# Patient Record
Sex: Male | Born: 1935
Health system: Southern US, Community
[De-identification: ages and names within clinical notes are randomized; demographics above are authoritative.]

## PROBLEM LIST (undated history)

## (undated) DIAGNOSIS — Z298 Encounter for other specified prophylactic measures: Secondary | ICD-10-CM

## (undated) DIAGNOSIS — D649 Anemia, unspecified: Secondary | ICD-10-CM

## (undated) DIAGNOSIS — N400 Enlarged prostate without lower urinary tract symptoms: Secondary | ICD-10-CM

## (undated) DIAGNOSIS — I509 Heart failure, unspecified: Secondary | ICD-10-CM

## (undated) DIAGNOSIS — E785 Hyperlipidemia, unspecified: Secondary | ICD-10-CM

## (undated) DIAGNOSIS — R011 Cardiac murmur, unspecified: Secondary | ICD-10-CM

## (undated) DIAGNOSIS — I10 Essential (primary) hypertension: Secondary | ICD-10-CM

## (undated) DIAGNOSIS — N189 Chronic kidney disease, unspecified: Secondary | ICD-10-CM

## (undated) DIAGNOSIS — Z2989 Encounter for other specified prophylactic measures: Secondary | ICD-10-CM

## (undated) DIAGNOSIS — I519 Heart disease, unspecified: Secondary | ICD-10-CM

## (undated) DIAGNOSIS — M1990 Unspecified osteoarthritis, unspecified site: Secondary | ICD-10-CM

## (undated) DIAGNOSIS — D689 Coagulation defect, unspecified: Secondary | ICD-10-CM

## (undated) DIAGNOSIS — I2510 Atherosclerotic heart disease of native coronary artery without angina pectoris: Secondary | ICD-10-CM

## (undated) HISTORY — DX: Hyperlipidemia, unspecified: E78.5

## (undated) HISTORY — DX: Cardiac murmur, unspecified: R01.1

## (undated) HISTORY — DX: Heart failure, unspecified: I50.9

## (undated) HISTORY — DX: Encounter for other specified prophylactic measures: Z29.8

## (undated) HISTORY — PX: HERNIA REPAIR: SHX51

## (undated) HISTORY — PX: EYE SURGERY: SHX253

## (undated) HISTORY — DX: Anemia, unspecified: D64.9

## (undated) HISTORY — DX: Atherosclerotic heart disease of native coronary artery without angina pectoris: I25.10

## (undated) HISTORY — DX: Coagulation defect, unspecified: D68.9

## (undated) HISTORY — DX: Essential (primary) hypertension: I10

## (undated) HISTORY — DX: Benign prostatic hyperplasia without lower urinary tract symptoms: N40.0

## (undated) HISTORY — DX: Chronic kidney disease, unspecified: N18.9

## (undated) HISTORY — DX: Encounter for other specified prophylactic measures: Z29.89

## (undated) HISTORY — DX: Heart disease, unspecified: I51.9

## (undated) HISTORY — DX: Unspecified osteoarthritis, unspecified site: M19.90

---

## 1962-03-11 HISTORY — PX: BACK SURGERY: SHX140

## 1993-03-11 DIAGNOSIS — I2510 Atherosclerotic heart disease of native coronary artery without angina pectoris: Secondary | ICD-10-CM

## 1993-03-11 HISTORY — PX: ANGIOPLASTY: SHX39

## 1993-03-11 HISTORY — DX: Atherosclerotic heart disease of native coronary artery without angina pectoris: I25.10

## 2006-03-06 ENCOUNTER — Encounter: Payer: Self-pay | Admitting: Family Medicine

## 2006-03-06 LAB — CONVERTED CEMR LAB
AST: 17 units/L
Alkaline Phosphatase: 69 units/L
BUN: 17 mg/dL
Creatinine, Ser: 0.9 mg/dL
Glucose, Bld: 111 mg/dL
HCT: 40.7 %
Hemoglobin: 14.1 g/dL
LDL Cholesterol: 89 mg/dL
Microalbumin U total vol: 2.23 mg/L
PSA: 0.57 ng/mL
Potassium: 4.3 meq/L
Total Bilirubin: 0.7 mg/dL
WBC, blood: 4.7 10*3/uL

## 2007-03-13 ENCOUNTER — Ambulatory Visit: Payer: Self-pay | Admitting: Family Medicine

## 2007-03-13 DIAGNOSIS — I2510 Atherosclerotic heart disease of native coronary artery without angina pectoris: Secondary | ICD-10-CM | POA: Insufficient documentation

## 2007-03-13 DIAGNOSIS — N400 Enlarged prostate without lower urinary tract symptoms: Secondary | ICD-10-CM | POA: Insufficient documentation

## 2007-03-13 DIAGNOSIS — I10 Essential (primary) hypertension: Secondary | ICD-10-CM | POA: Insufficient documentation

## 2007-03-13 DIAGNOSIS — L570 Actinic keratosis: Secondary | ICD-10-CM | POA: Insufficient documentation

## 2007-03-13 DIAGNOSIS — Z87898 Personal history of other specified conditions: Secondary | ICD-10-CM | POA: Insufficient documentation

## 2007-03-16 ENCOUNTER — Encounter: Payer: Self-pay | Admitting: Family Medicine

## 2007-04-07 ENCOUNTER — Encounter: Payer: Self-pay | Admitting: Family Medicine

## 2007-04-07 DIAGNOSIS — I2789 Other specified pulmonary heart diseases: Secondary | ICD-10-CM | POA: Insufficient documentation

## 2007-04-10 ENCOUNTER — Ambulatory Visit: Payer: Self-pay | Admitting: Family Medicine

## 2007-04-10 LAB — CONVERTED CEMR LAB

## 2007-04-13 ENCOUNTER — Telehealth (INDEPENDENT_AMBULATORY_CARE_PROVIDER_SITE_OTHER): Payer: Self-pay | Admitting: *Deleted

## 2007-05-27 ENCOUNTER — Encounter: Payer: Self-pay | Admitting: Family Medicine

## 2007-05-27 LAB — CONVERTED CEMR LAB
Anion Gap: 29
BUN: 19 mg/dL
Chloride: 100 meq/L
Glucose, Bld: 109 mg/dL
Potassium: 3.6 meq/L
Sodium: 139 meq/L

## 2007-06-09 ENCOUNTER — Ambulatory Visit: Payer: Self-pay | Admitting: Family Medicine

## 2007-07-10 ENCOUNTER — Encounter: Payer: Self-pay | Admitting: Family Medicine

## 2007-07-13 ENCOUNTER — Encounter: Payer: Self-pay | Admitting: Family Medicine

## 2007-08-07 ENCOUNTER — Ambulatory Visit: Payer: Self-pay | Admitting: Family Medicine

## 2007-08-07 DIAGNOSIS — R809 Proteinuria, unspecified: Secondary | ICD-10-CM | POA: Insufficient documentation

## 2007-08-10 LAB — CONVERTED CEMR LAB
Cholesterol: 171 mg/dL (ref 0–200)
HDL: 72 mg/dL (ref >39)
LDL Cholesterol: 87 mg/dL (ref 0–99)
Total CHOL/HDL Ratio: 2.4
Triglycerides: 59 mg/dL (ref <150)
VLDL: 12 mg/dL (ref 0–40)

## 2007-09-07 ENCOUNTER — Encounter: Payer: Self-pay | Admitting: Family Medicine

## 2007-09-09 ENCOUNTER — Encounter: Payer: Self-pay | Admitting: Family Medicine

## 2007-09-14 ENCOUNTER — Encounter: Payer: Self-pay | Admitting: Family Medicine

## 2008-05-06 ENCOUNTER — Ambulatory Visit: Payer: Self-pay | Admitting: Family Medicine

## 2008-05-06 LAB — CONVERTED CEMR LAB

## 2008-05-09 LAB — CONVERTED CEMR LAB: PSA: 0.76 ng/mL (ref 0.10–4.00)

## 2008-09-16 ENCOUNTER — Encounter: Payer: Self-pay | Admitting: Family Medicine

## 2008-11-28 ENCOUNTER — Ambulatory Visit: Payer: Self-pay | Admitting: Family Medicine

## 2008-11-29 LAB — CONVERTED CEMR LAB
AST: 19 units/L (ref 0–37)
Albumin: 4.2 g/dL (ref 3.5–5.2)
Alkaline Phosphatase: 64 units/L (ref 39–117)
Creatinine, Urine: 34.6 mg/dL
Direct LDL: 83 mg/dL
Glucose, Bld: 109 mg/dL — ABNORMAL HIGH (ref 70–99)
Microalb, Ur: 0.5 mg/dL (ref 0.00–1.89)
Potassium: 4.7 meq/L (ref 3.5–5.3)
Sodium: 143 meq/L (ref 135–145)
Total Bilirubin: 0.4 mg/dL (ref 0.3–1.2)
Total Protein: 6.7 g/dL (ref 6.0–8.3)

## 2009-01-09 ENCOUNTER — Ambulatory Visit: Payer: Self-pay | Admitting: Family Medicine

## 2009-05-10 ENCOUNTER — Ambulatory Visit: Payer: Self-pay | Admitting: Family Medicine

## 2009-05-10 LAB — CONVERTED CEMR LAB: Creatinine,U: 200 mg/dL

## 2009-05-11 LAB — CONVERTED CEMR LAB
Cholesterol: 165 mg/dL (ref 0–200)
HDL: 62 mg/dL (ref >39)
PSA: 0.84 ng/mL (ref 0.10–4.00)

## 2009-09-12 ENCOUNTER — Ambulatory Visit: Payer: Self-pay | Admitting: Family Medicine

## 2009-09-21 ENCOUNTER — Encounter: Payer: Self-pay | Admitting: Family Medicine

## 2009-10-03 ENCOUNTER — Ambulatory Visit: Payer: Self-pay | Admitting: Family Medicine

## 2009-10-04 ENCOUNTER — Encounter: Payer: Self-pay | Admitting: Family Medicine

## 2009-10-24 ENCOUNTER — Encounter: Payer: Self-pay | Admitting: Family Medicine

## 2009-10-27 HISTORY — PX: CORONARY ARTERY BYPASS GRAFT: SHX141

## 2009-10-31 ENCOUNTER — Encounter: Payer: Self-pay | Admitting: Family Medicine

## 2009-11-07 ENCOUNTER — Encounter: Payer: Self-pay | Admitting: Family Medicine

## 2009-11-21 ENCOUNTER — Encounter: Payer: Self-pay | Admitting: Family Medicine

## 2009-11-24 ENCOUNTER — Encounter: Payer: Self-pay | Admitting: Family Medicine

## 2009-12-05 ENCOUNTER — Encounter: Payer: Self-pay | Admitting: Family Medicine

## 2009-12-18 ENCOUNTER — Encounter: Payer: Self-pay | Admitting: Family Medicine

## 2009-12-20 ENCOUNTER — Ambulatory Visit: Payer: Self-pay | Admitting: Family Medicine

## 2010-01-29 ENCOUNTER — Encounter: Payer: Self-pay | Admitting: Family Medicine

## 2010-01-29 DIAGNOSIS — E785 Hyperlipidemia, unspecified: Secondary | ICD-10-CM | POA: Insufficient documentation

## 2010-01-29 DIAGNOSIS — E1122 Type 2 diabetes mellitus with diabetic chronic kidney disease: Secondary | ICD-10-CM | POA: Insufficient documentation

## 2010-01-29 DIAGNOSIS — E119 Type 2 diabetes mellitus without complications: Secondary | ICD-10-CM | POA: Insufficient documentation

## 2010-02-16 ENCOUNTER — Encounter: Payer: Self-pay | Admitting: Family Medicine

## 2010-02-16 LAB — CONVERTED CEMR LAB
HDL: 72 mg/dL
Triglycerides: 59 mg/dL

## 2010-03-26 ENCOUNTER — Encounter: Payer: Self-pay | Admitting: Family Medicine

## 2010-04-04 ENCOUNTER — Telehealth: Payer: Self-pay | Admitting: Family Medicine

## 2010-04-06 ENCOUNTER — Encounter: Payer: Self-pay | Admitting: Family Medicine

## 2010-04-06 ENCOUNTER — Ambulatory Visit
Admission: RE | Admit: 2010-04-06 | Discharge: 2010-04-06 | Payer: Self-pay | Source: Home / Self Care | Attending: Family Medicine | Admitting: Family Medicine

## 2010-04-10 NOTE — Assessment & Plan Note (Signed)
Summary: MEDICARE PHYSICAL   Vital Signs:  Patient profile:   77 year old male Height:      68 inches Weight:      151 pounds BMI:     23.04 Temp:     98.1 degrees F oral Pulse rate:   80 / minute Pulse rhythm:   regular BP sitting:   111 / 66  (right arm) Cuff size:   regular  Vitals Entered By: Duard Brady LPN (September 12, 3498 8:18 AM) CC: cpx- doing well -     fbs 117 Is Patient Diabetic? Yes Did you bring your meter with you today? No   Primary Care Provider:  Seymour Bars DO  CC:  cpx- doing well -     fbs 117.  History of Present Illness: 77 yo WM presents for Medicare Continuing Physical.  He has a PMH of HTN, CAD, T2DM, BPH and pulm HTN.  Doing well on Terazosin, Simvastatin, Nifediac, Fosinopril, Metformin and ASA.  Gets drugs from the [place].  He has no allergies, never used tobacco, denies ETOH or ellicit drugs.  He lives alone with family nearby.  he does all of his ADLs other than his finances, which his daughter takes care of.  he widowed to [place] last Sept and seems to be getting along OK.    Over the past 2 wks, he has not felt down or depressed but admits to missing his wife often.  Over the past 2 wks, he has not had little pleasure in doeing things.  [place].  His functional ability screen is Neg x 3 other than finances.   Hearing eval is grossly normal. Sees his eye doctor on Monday for annual eye exam.  He sees Dr Mindi Curling (cards), last seen 6-09 for a stress echo. Has a living will but plans to go over advanced directives.    DRE today. Colon 05--> 2015 Td 2004 PNX at 67 DEXA-- Chol 3-11 Cr 11-2008 no AAA screen (never smoked).   Allergies: No Known Drug Allergies  Past History:  Past Medical History: Reviewed history from 05/06/2008 and no changes required. DM, type II since 2004 CAD with angioplasty 1995 HTN high chol anemia BPH mild valvular heart dz with pulm HTN needs SBE prophylaxis  Past Surgical  History: Reviewed history from 03/13/2007 and no changes required. angioplasty 1995, [facility] Hernia x 2 back surgery 1964  Social History: Reviewed history from 11/28/2008 and no changes required. Retired from Research officer, trade union. HS diploma. Widowed 11-2008 2 grown children, local with 3 grandkids. Never smoked. Denies ETOH. Does some yardwork. Walks 15 min daily.  Review of Systems  The patient denies anorexia, fever, weight loss, weight gain, vision loss, decreased hearing, hoarseness, chest pain, syncope, dyspnea on exertion, peripheral edema, prolonged cough, headaches, hemoptysis, abdominal pain, melena, hematochezia, severe indigestion/heartburn, hematuria, incontinence, genital sores, muscle weakness, suspicious skin lesions, transient blindness, difficulty walking, depression, unusual weight change, abnormal bleeding, enlarged lymph nodes, angioedema, breast masses, and testicular masses.    Physical Exam  General:  alert, well-developed, well-nourished, and well-hydrated.   Head:  normocephalic and atraumatic.   Eyes:  pupils equal, pupils round, and pupils reactive to light.   Ears:  EACs patent; TMs translucent and gray with good cone of light and bony landmarks.  Nose:  no nasal discharge.   Mouth:  pharynx pink and moist and poor dentition.   Neck:  no masses.  no audible carotid bruits, no JVD  Lungs:  Normal respiratory effort, chest expands symmetrically. Lungs are clear to auscultation, no crackles or wheezes. Heart:  2/6 systolic murmur over apex, no AA bruitsnormal rate and regular rhythm.   Abdomen:  Bowel sounds positive,abdomen soft and non-tender without masses, organomegaly Rectal:  No external abnormalities noted. Normal sphincter tone. No rectal masses or tenderness.  hemoccult neg Prostate:  Prostate gland firm and smooth, 1+ enlargement, no nodularity, tenderness, mass, asymmetry or induration. Msk:  no joint tenderness, no joint swelling, no joint  warmth, and no redness over joints.   Pulses:  2+ radial and pedal pulses Extremities:  no LE edema Neurologic:  gait normal.   Skin:  color normal.   Psych:  memory intact for recent and remote, good eye contact, not anxious appearing, and not depressed appearing.     Impression & Recommendations:  Problem # 1:  HEALTH MAINTENANCE EXAM (ICD-V70.0) Full preventive medicare physical done.  he will return for the EKG portion (nursing shortage today). TD done 04 DRE done. Colonoscopy due 2015. PNX UTD. Labs UTD.  Orders: Subsequent annual wellness visit with prevention plan (Y7829)  Complete Medication List: 1)  Terazosin Hcl 10 Mg Caps (Terazosin hcl) .... Take one tablet by mouth once a day 2)  Simvastatin 20 Mg Tabs (Simvastatin) .... Take 1/2 tablet by mouth at bedtime 3)  Nifediac Cc 30 Mg Tb24 (Nifedipine) .... Take one tablet by mouth once a day 4)  Fosinopril Sodium 20 Mg Tabs (Fosinopril sodium) .Marland Kitchen.. 1 tab by mouth daily 5)  Metformin Hcl 500 Mg Tabs (Metformin hcl) .... Take one tablet by mouth twice a day 6)  Bufferin Low Dose 81 Mg Tbec (Aspirin) .... Take one tablet by mouth once a day  Patient Instructions: 1)  Return for a NURSE VISIT IN 3 WKS FOR EKG AND A1C (NO CHARGE FOR EKG)

## 2010-04-10 NOTE — Miscellaneous (Signed)
Summary: CABG  Clinical Lists Changes  Observations: Added new observation of PAST SURG HX: angioplasty 1995, [facility] Hernia x 2 back surgery 1964 5 vessel CABG 10-27-2009, Dr Raoul Pitch (11/24/2009 14:07) Added new observation of PRIMARY MD: Seymour Bars DO (11/24/2009 14:07)        Past History:  Past Surgical History: angioplasty 1995, [facility] Hernia x 2 back surgery 1964 5 vessel CABG 10-27-2009, Dr Raoul Pitch

## 2010-04-10 NOTE — Letter (Signed)
[facility]: [facility] Kathryne Sharper   Imported By: Lanelle Bal 12/14/2009 10:28:33  _____________________________________________________________________  External Attachment:    Type:   Image     Comment:   External Document

## 2010-04-10 NOTE — Cardiovascular Report (Signed)
Summary: [facility]  [facility]   Imported By: Lanelle Bal 10/31/2009 12:38:32  _____________________________________________________________________  External Attachment:    Type:   Image     Comment:   External Document

## 2010-04-10 NOTE — Letter (Signed)
Summary: [facility]  [facility]   Imported By: Lanelle Bal 11/06/2009 10:17:21  _____________________________________________________________________  External Attachment:    Type:   Image     Comment:   External Document

## 2010-04-10 NOTE — Letter (Signed)
Summary: [facility]  [facility]   Imported By: Lanelle Bal 12/05/2009 11:27:12  _____________________________________________________________________  External Attachment:    Type:   Image     Comment:   External Document

## 2010-04-10 NOTE — Assessment & Plan Note (Signed)
Summary: FLU-SHOT-VEW  Nurse Visit   Vitals Entered By: Payton Spark CMA (December 20, 2009 9:58 AM)  Allergies: No Known Drug Allergies  Orders Added: 1)  Flu Vaccine 52yrs + MEDICARE PATIENTS [Q2039] 2)  Administration Flu vaccine - MCR [G0008] Flu Vaccine Consent Questions     Do you have a history of severe allergic reactions to this vaccine? no    Any prior history of allergic reactions to egg and/or gelatin? no    Do you have a sensitivity to the preservative Thimersol? no    Do you have a past history of Guillan-Barre Syndrome? no    Do you currently have an acute febrile illness? no    Have you ever had a severe reaction to latex? no    Vaccine information given and explained to patient? yes    Are you currently pregnant? no    Lot Number:[id number]   Exp Date:09/08/2010   Site Given  Left Deltoid IMmedflu

## 2010-04-10 NOTE — Letter (Signed)
Summary: Obstructive Sleep Apnea Screening Questionnaire/[facility]   Obstructive Sleep Apnea Screening Questionnaire/[facility]   Imported By: Maryln Gottron 02/16/2010 13:39:15  _____________________________________________________________________  External Attachment:    Type:   Image     Comment:   External Document

## 2010-04-10 NOTE — Medication Information (Signed)
Summary: Diabetes Supplies/[facility]  Diabetes Supplies/[facility]   Imported By: Lanelle Bal 11/15/2009 08:28:42  _____________________________________________________________________  External Attachment:    Type:   Image     Comment:   External Document

## 2010-04-10 NOTE — Letter (Signed)
[facility]: [facility]  [facility]   Imported By: Lanelle Bal 10/13/2009 12:16:28  _____________________________________________________________________  External Attachment:    Type:   Image     Comment:   External Document

## 2010-04-10 NOTE — Assessment & Plan Note (Signed)
Summary: EKG AND A1C- JR  Nurse Visit   Vitals Entered By: Payton Spark CMA (October 03, 2009 8:44 AM) CC: EKG and A1C   Allergies: No Known Drug Allergies Laboratory Results   Blood Tests     HGBA1C: 5.9%   (Normal Range: Non-Diabetic - 3-6%   Control Diabetic - 6-8%)     Orders Added: 1)  Fingerstick [36416] 2)  Hgb A1C [83036QW]     Appended Document: EKG AND A1C- JR Pls let pt know that his A1C is perfect at 5.9 indicating great control of his diabetes.  EKG is abnormal.  FAx copy to [facility] cardiology -- needs f/u appt ASAP.  Seymour Bars, D.O.  Appended Document: EKG AND A1C- JR Pt aware of the above. Scheduled w/ Dr. Molly Maduro tomorrow @ 10am. Pt aware.

## 2010-04-10 NOTE — Miscellaneous (Signed)
Summary: normal eye exam  Clinical Lists Changes  Observations: Added new observation of DIAB EYE EX: normal (Dr Luretha Murphy) (09/18/2009 12:12)

## 2010-04-10 NOTE — Assessment & Plan Note (Signed)
Summary: f/u diabetes   Vital Signs:  Patient profile:   77 year old male Height:      68 inches Weight:      145 pounds BMI:     22.13 O2 Sat:      97 % on Room air Pulse rate:   98 / minute BP sitting:   116 / 63  (left arm) Cuff size:   regular  Vitals Entered By: Payton Spark CMA (May 10, 2009 8:39 AM)  O2 Flow:  Room air CC: F/U DM   Primary Care Provider:  Seymour Bars DO  CC:  F/U DM.  History of Present Illness: 77 yo WM presents for f/u T2DM.  Doing well on Metformin 500 mg two times a day.  Checking fasting sugars once a wk, in the 110s.  Denies any lows.  Adherent to diabetic diet.  Staying physically active.  Denies blurry vision or paresthesias.  His FLP is due and his PSA is due.  He goes to the [place] but we do not get his labs sent here.    Denies CP or DOE.     Allergies (verified): No Known Drug Allergies  Past History:  Past Medical History: Reviewed history from 05/06/2008 and no changes required. DM, type II since 2004 CAD with angioplasty 1995 HTN high chol anemia BPH mild valvular heart dz with pulm HTN needs SBE prophylaxis  Past Surgical History: Reviewed history from 03/13/2007 and no changes required. angioplasty 1995, [facility] Hernia x 2 back surgery 1964  Social History: Reviewed history from 11/28/2008 and no changes required. Retired from Research officer, trade union. HS diploma. Widowed 11-2008 2 grown children, local with 3 grandkids. Never smoked. Denies ETOH. Does some yardwork. Walks 15 min daily.  Review of Systems      See HPI  Physical Exam  General:  alert, well-developed, well-nourished, and well-hydrated.   Head:  normocephalic and atraumatic.   Eyes:  wears glasses, PERRLA Ears:  no external deformities.   Nose:  no nasal discharge.   Mouth:  pharynx pink and moist and poor dentition.   Neck:  no masses.   Lungs:  Normal respiratory effort, chest expands symmetrically. Lungs are clear to auscultation, no  crackles or wheezes. Heart:  2/6 systolic murmur over apex, no AA bruitsnormal rate and regular rhythm.   Pulses:  2+ radial pulses Extremities:  no LE edema Skin:  color normal.   Cervical Nodes:  No lymphadenopathy noted Psych:  good eye contact, not anxious appearing, and not depressed appearing.     Impression & Recommendations:  Problem # 1:  DM (ICD-250.00) Doing well on current meds.  A1C is 6.2, at goal.  Continue meds along with diabetic diet and physical activity.  Eye exam is UTD. Monofilament is UTD.   His updated medication list for this problem includes:    Fosinopril Sodium 20 Mg Tabs (Fosinopril sodium) .Marland Kitchen... 1 tab by mouth daily    Metformin Hcl 500 Mg Tabs (Metformin hcl) .Marland Kitchen... Take one tablet by mouth twice a day    Bufferin Low Dose 81 Mg Tbec (Aspirin) .Marland Kitchen... Take one tablet by mouth once a day  Labs Reviewed: Creat: 1.03 (11/28/2008)   Microalbumin: 150 (05/10/2009)  Last Eye Exam: no retinopathy (Dr Luretha Murphy) (09/05/2008) Reviewed HgBA1c results: 5.5 (11/28/2008)  5.7 (05/06/2008)  Problem # 2:  ESSENTIAL HYPERTENSION, BENIGN (ICD-401.1) Doing well on current meds.  Continue. His updated medication list for this problem includes:    Terazosin  Hcl 10 Mg Caps (Terazosin hcl) .Marland Kitchen... Take one tablet by mouth once a day    Nifediac Cc 30 Mg Tb24 (Nifedipine) .Marland Kitchen... Take one tablet by mouth once a day    Fosinopril Sodium 20 Mg Tabs (Fosinopril sodium) .Marland Kitchen... 1 tab by mouth daily  BP today: 116/63 Prior BP: 125/68 (01/09/2009)  Labs Reviewed: K+: 4.7 (11/28/2008) Creat: : 1.03 (11/28/2008)   Chol: 171 (08/07/2007)   HDL: 72 (08/07/2007)   LDL: 87 (08/07/2007)   TG: 59 (08/07/2007)  Problem # 3:  CAD (ICD-414.00) Stable and doing well on current meds.  Had a stress test about 2 yrs ago. His updated medication list for this problem includes:    Terazosin Hcl 10 Mg Caps (Terazosin hcl) .Marland Kitchen... Take one tablet by mouth once a day    Nifediac Cc 30 Mg Tb24  (Nifedipine) .Marland Kitchen... Take one tablet by mouth once a day    Fosinopril Sodium 20 Mg Tabs (Fosinopril sodium) .Marland Kitchen... 1 tab by mouth daily    Bufferin Low Dose 81 Mg Tbec (Aspirin) .Marland Kitchen... Take one tablet by mouth once a day  Orders: T-Lipid Profile ([id number])  Problem # 4:  PULMONARY HYPERTENSION (ICD-416.8) Will update his EKG at CPE in 4 mos.  Complete Medication List: 1)  Terazosin Hcl 10 Mg Caps (Terazosin hcl) .... Take one tablet by mouth once a day 2)  Simvastatin 20 Mg Tabs (Simvastatin) .... Take 1/2 tablet by mouth at bedtime 3)  Nifediac Cc 30 Mg Tb24 (Nifedipine) .... Take one tablet by mouth once a day 4)  Fosinopril Sodium 20 Mg Tabs (Fosinopril sodium) .Marland Kitchen.. 1 tab by mouth daily 5)  Metformin Hcl 500 Mg Tabs (Metformin hcl) .... Take one tablet by mouth twice a day 6)  Bufferin Low Dose 81 Mg Tbec (Aspirin) .... Take one tablet by mouth once a day  Other Orders: Fingerstick (36416) Hemoglobin A1C (83036) T-PSA Total (Medicare Screen Only) ([id number])  Patient Instructions: 1)  A1C good at 6.2. 2)  Stay on current meds. 3)  Labs today. 4)  Will call you w/ results tomorrow. 5)  Return in 4 mos for a complete physical with EKG.  Laboratory Results   Urine Tests    Microalbumin (urine): 150 mg/L Creatinine: 200mg /dL  A:C Ratio 30-865

## 2010-04-10 NOTE — Letter (Signed)
Summary: [facility]  [facility]   Imported By: Maryln Gottron 02/12/2010 13:24:08  _____________________________________________________________________  External Attachment:    Type:   Image     Comment:   External Document

## 2010-04-12 NOTE — Progress Notes (Signed)
Summary: Cough, cold symptoms  Phone Note Call from Patient Call back at Home Phone [phone]   Caller: Patient Call For: Seymour Bars DO Reason for Call: Acute Illness Summary of Call: pt has a cough and cold. I spoke to pt and he states he is using saline nasal spray and that is helping.  He also is having a productive cough.  Pt  states cough is making his chest hurt more than expected due to his open heart surgery in August.  Pt will call on 1/26 for appt to eval cough.  Pt also would like to review meds at that time as he says his other doctor has changed some meds. Initial call taken by: Francee Piccolo CMA Duncan Dull),  April 04, 2010 5:43 PM

## 2010-04-12 NOTE — Miscellaneous (Signed)
[facility]  Clinical Lists Changes  Observations: Added new observation of HGBA1C: 6.3 % (02/16/2010 16:45) Added new observation of LDL: 57 mg/dL (60/45/4098 11:91) Added new observation of HDL: 72 mg/dL (47/82/9562 13:08) Added new observation of TRIGLYC TOT: 59 mg/dL (65/78/4696 29:52) Added new observation of CHOLESTEROL: 141 mg/dL (84/13/2440 10:27)

## 2010-04-12 NOTE — Assessment & Plan Note (Signed)
Summary: URI   Vital Signs:  Patient profile:   77 year old male Height:      68 inches Weight:      147 pounds BMI:     22.43 O2 Sat:      96 % on Room air Temp:     98.0 degrees F oral Pulse rate:   76 / minute BP sitting:   119 / 66  (left arm) Cuff size:   regular  Vitals Entered By: Payton Spark CMA (April 06, 2010 2:26 PM)  O2 Flow:  Room air CC: Head congestion and cough x 1 week.    Primary Care Provider:  Seymour Bars DO  CC:  Head congestion and cough x 1 week. Marland Kitchen  History of Present Illness: 77 yo WM presents for a cold that started a wk ago with congestion.  No fevers.  Cough is dry.  He is not taking anything other than a sinus rinse since he has HTN and had a CABG in the Fall 2011.  Denies CP or DOE.  Denies sore throat, chest tightness or GI upset.  Current Medications (verified): 1)  Terazosin Hcl 10 Mg  Caps (Terazosin Hcl) .... Take One Tablet By Mouth Once A Day 2)  Metformin Hcl 500 Mg  Tabs (Metformin Hcl) .... Take One Tablet By Mouth Twice A Day 3)  Aspirin 325 Mg Tabs (Aspirin) .... Take 1 Tab By Mouth Once Daily 4)  Pravastatin Sodium 40 Mg Tabs (Pravastatin Sodium) .... Take 1 Tab By Mouth At Bedtime 5)  Metoprolol Succinate 25 Mg Xr24h-Tab (Metoprolol Succinate) .... Take 1 Tab By Mouth Once Daily 6)  Benazepril Hcl 40 Mg Tabs (Benazepril Hcl) .... Take 1 Tab By Mouth Once Daily 7)  Hydrochlorothiazide 25 Mg Tabs (Hydrochlorothiazide) .... Take 2 Tabs By Mouth Daily  Allergies (verified): No Known Drug Allergies  Past History:  Past Medical History: Reviewed history from 05/06/2008 and no changes required. DM, type II since 2004 CAD with angioplasty 1995 HTN high chol anemia BPH mild valvular heart dz with pulm HTN needs SBE prophylaxis  Past Surgical History: Reviewed history from 11/24/2009 and no changes required. angioplasty 1995, [facility] Hernia x 2 back surgery 1964 5 vessel CABG 10-27-2009, Dr Raoul Pitch  Social  History: Reviewed history from 11/28/2008 and no changes required. Retired from Research officer, trade union. HS diploma. Widowed 11-2008 2 grown children, local with 3 grandkids. Never smoked. Denies ETOH. Does some yardwork. Walks 15 min daily.  Review of Systems      See HPI  Physical Exam  General:  alert, well-developed, well-nourished, and well-hydrated.   Eyes:  conjunctiva clear Ears:  EACs patent; TMs translucent and gray with good cone of light and bony landmarks.  Nose:  clear rhinorrhea Mouth:  pharynx pink and moist.   Neck:  no masses.   Lungs:  Normal respiratory effort, chest expands symmetrically. Lungs are clear to auscultation, no crackles or wheezes.  dry cough Heart:  2/6 systolic murmur over apex, no AA bruitsnormal rate and regular rhythm.   Pulses:  2+ radial pulses Extremities:  no LE edema Skin:  color normal.   Cervical Nodes:  shoddy anterior cervical chain LA Psych:  good eye contact, not anxious appearing, and not depressed appearing.     Impression & Recommendations:  Problem # 1:  VIRAL URI (ICD-465.9) Will treat with supportive care measures - Sinus rinse + Tessalon Perles.  Call if any new onset fever, SOB, chest pain or not  improved in 10 days. His updated medication list for this problem includes:    Aspirin 325 Mg Tabs (Aspirin) .Marland Kitchen... Take 1 tab by mouth once daily    Tessalon Perles 100 Mg Caps (Benzonatate) .Marland Kitchen... 1-2 capsules by mouth three times a day as needed cough  Problem # 2:  ESSENTIAL HYPERTENSION, BENIGN (ICD-401.1) BP looks good.  I RFd his HCTZ today.  Had FLP at the [facility] 02-2010.  Update CMP with PSA at CPE in 2 mos. His updated medication list for this problem includes:    Terazosin Hcl 10 Mg Caps (Terazosin hcl) .Marland Kitchen... Take one tablet by mouth once a day    Metoprolol Succinate 25 Mg Xr24h-tab (Metoprolol succinate) .Marland Kitchen... Take 1 tab by mouth once daily    Benazepril Hcl 40 Mg Tabs (Benazepril hcl) .Marland Kitchen... Take 1 tab by mouth once  daily    Hydrochlorothiazide 25 Mg Tabs (Hydrochlorothiazide) .Marland Kitchen... 1 tab by mouth daily  BP today: 119/66 Prior BP: 111/66 (09/12/2009)  Labs Reviewed: K+: 4.7 (11/28/2008) Creat: : 1.03 (11/28/2008)   Chol: 165 (05/10/2009)   HDL: 62 (05/10/2009)   LDL: 84 (05/10/2009)   TG: 96 (05/10/2009)  Complete Medication List: 1)  Terazosin Hcl 10 Mg Caps (Terazosin hcl) .... Take one tablet by mouth once a day 2)  Metformin Hcl 500 Mg Tabs (Metformin hcl) .... Take one tablet by mouth twice a day 3)  Aspirin 325 Mg Tabs (Aspirin) .... Take 1 tab by mouth once daily 4)  Pravastatin Sodium 40 Mg Tabs (Pravastatin sodium) .... Take 1 tab by mouth at bedtime 5)  Metoprolol Succinate 25 Mg Xr24h-tab (Metoprolol succinate) .... Take 1 tab by mouth once daily 6)  Benazepril Hcl 40 Mg Tabs (Benazepril hcl) .... Take 1 tab by mouth once daily 7)  Hydrochlorothiazide 25 Mg Tabs (Hydrochlorothiazide) .Marland Kitchen.. 1 tab by mouth daily 8)  Tessalon Perles 100 Mg Caps (Benzonatate) .Marland Kitchen.. 1-2 capsules by mouth three times a day as needed cough  Patient Instructions: 1)  For cold symptoms: 2)  Continue sinus rinse. 3)  Add Tessalon Perles as needed for cough. 4)  Call if not feeling better in 7-10 days. 5)  I filled HCTZ at [facility] to pick up. 6)  Return for a PHYSICAL WITH PSA  in 2 mos. Prescriptions: TESSALON PERLES 100 MG CAPS (BENZONATATE) 1-2 capsules by mouth three times a day as needed cough  #30 x 0   Entered and Authorized by:   Seymour Bars DO   Signed by:   Seymour Bars DO on 04/06/2010   Method used:   Electronically to        [facility] [phone]* (retail)       [address] [place], [place]  [address]       Ph: [phone]       Fax: [phone]   RxID:   [id number] HYDROCHLOROTHIAZIDE 25 MG TABS (HYDROCHLOROTHIAZIDE) 1 tab by mouth daily  #90 x 1   Entered and Authorized by:   Seymour Bars DO   Signed by:   Seymour Bars DO on 04/06/2010   Method used:   Electronically to         [facility] [phone]* (retail)       [address] [place], [place]  [address]       Ph: [phone]       Fax: [phone]   RxID:   [id number]  Orders Added: 1)  Est. Patient Level III [81859]

## 2010-04-30 ENCOUNTER — Encounter: Payer: Self-pay | Admitting: Family Medicine

## 2010-05-02 NOTE — Letter (Signed)
Summary: [facility]   [facility]   Imported By: Kassie Mends 04/26/2010 08:18:40  _____________________________________________________________________  External Attachment:    Type:   Image     Comment:   External Document

## 2010-05-12 ENCOUNTER — Encounter: Payer: Self-pay | Admitting: Family Medicine

## 2010-06-04 ENCOUNTER — Encounter: Payer: Self-pay | Admitting: Family Medicine

## 2010-06-11 ENCOUNTER — Ambulatory Visit (INDEPENDENT_AMBULATORY_CARE_PROVIDER_SITE_OTHER): Payer: Medicare Other | Admitting: Family Medicine

## 2010-06-11 ENCOUNTER — Encounter: Payer: Self-pay | Admitting: Family Medicine

## 2010-06-11 DIAGNOSIS — Z125 Encounter for screening for malignant neoplasm of prostate: Secondary | ICD-10-CM

## 2010-06-11 DIAGNOSIS — I2510 Atherosclerotic heart disease of native coronary artery without angina pectoris: Secondary | ICD-10-CM

## 2010-06-11 DIAGNOSIS — E119 Type 2 diabetes mellitus without complications: Secondary | ICD-10-CM

## 2010-06-11 DIAGNOSIS — Z0000 Encounter for general adult medical examination without abnormal findings: Secondary | ICD-10-CM

## 2010-06-11 LAB — CBC WITH DIFFERENTIAL/PLATELET
Hemoglobin: 12.8 g/dL — ABNORMAL LOW (ref 13.0–17.0)
Lymphs Abs: 1.5 10*3/uL (ref 0.7–4.0)
MCH: 33.2 pg (ref 26.0–34.0)
MCHC: 34.1 g/dL (ref 30.0–36.0)
MCV: 97.2 fL (ref 78.0–100.0)
Monocytes Relative: 6 % (ref 3–12)
Platelets: 144 10*3/uL — ABNORMAL LOW (ref 150–400)
RBC: 3.85 MIL/uL — ABNORMAL LOW (ref 4.22–5.81)

## 2010-06-11 NOTE — Progress Notes (Signed)
Subjective:    Patient ID: Ryan Bernard, male    DOB: 1931-10-19, 77 y.o.   MRN: [id number]  HPI 77  yo WM presents for CPE.  He has hx of HTN, dyslipidemia, T2DM, CAD, s/p CABG in 2011 and does not have any complaints.  He denies a fam hx of premature heart disease, prostate cancer or colon cancer.    Labs due today, except FLP, done 02-2010. His last tetanus vaccine was xx He had CABG for CAD in 2011 and has had f/u with Dr Mindi Curling.  Did have episode of pAT which has resolved and remains asymptomatic.  Completed cardiac rehab Jan 2012. His last colonoscopy was 2005  He takes a MVI and ASA 81 mg/ day.  BP 143/83  Pulse 68  Ht 5\' 8"  (1.727 m)  Wt 147 lb (66.679 kg)  BMI 22.35 kg/m2  SpO2 96%  Past Medical History  Diagnosis Date  . CAD (coronary artery disease) 1995    w/ angioplasty  . Hypertension   . Hyperlipidemia   . Anemia   . BPH (benign prostatic hyperplasia)   . Heart disease     Mild valvular w/ pulm HTN  . SBE (subacute bacterial endocarditis) prophylaxis candidate   . Diabetes mellitus 2004    type 2    Past Surgical History  Procedure Date  . Angioplasty 1995    [facility]  . Hernia repair     x 2   . Back surgery 1964  . Coronary artery bypass graft 10/27/2009    5 vessel, Dr. Raoul Pitch    Family History  Problem Relation Age of Onset  . Heart disease Mother   . Hypertension Mother   . Alcohol abuse Father   . Depression Daughter   . Depression Son     History   Social History  . Marital Status: Married    Spouse Name: N/A    Number of Children: N/A  . Years of Education: N/A   Occupational History  . Not on file.   Social History Main Topics  . Smoking status: Never Smoker   . Smokeless tobacco: Not on file  . Alcohol Use: No  . Drug Use: No  . Sexually Active:    Other Topics Concern  . Not on file   Social History Narrative  . No narrative on file    Not on File  Current outpatient prescriptions:aspirin 325 MG  tablet, Take 325 mg by mouth daily.  , Disp: , Rfl: ;  benazepril (LOTENSIN) 40 MG tablet, Take 40 mg by mouth daily.  , Disp: , Rfl: ;  hydrochlorothiazide 25 MG tablet, Take 25 mg by mouth daily.  , Disp: , Rfl: ;  metFORMIN (GLUCOPHAGE) 500 MG tablet, Take 500 mg by mouth 2 (two) times daily with a meal.  , Disp: , Rfl:  metoprolol succinate (TOPROL-XL) 25 MG 24 hr tablet, Take 25 mg by mouth daily.  , Disp: , Rfl: ;  pravastatin (PRAVACHOL) 40 MG tablet, Take 40 mg by mouth at bedtime.  , Disp: , Rfl: ;  terazosin (HYTRIN) 10 MG capsule, Take 10 mg by mouth daily.  , Disp: , Rfl: ;  benzonatate (TESSALON) 100 MG capsule, Take by mouth as directed. Take 1-2 capsules po tid prn cough , Disp: , Rfl:      Review of Systems Gen: no fevers, chills, hot flashes, night sweats, change in weight GI: no N/V/C/D GU: no dysuria, incontinence or sexual dysfunction CV: no  chest pain, DOE, palpitations s or edema Pulm:  Denies CP, SOB or chronic cough      Objective:   Physical Exam Gen: alert, well groomed in NAD Neck: no thyromegaly or cervical lymphadenopathy CV: RRR w/o murmur, no audible carotid bruits or abdominal aortic bruits Ext: no edema, clubbing or cyanosis Lungs: CTA bilat w/o W/R/R; nonlabored HEENT:  [place]/AT; PERRLA; oropharynx pink and moist with poor dentition Skin: warm and dry; no rash, pallor or jaundice, sunburn on posterior neck, face and dorsum of hands. Psych: does not appear anxious or depressed; answers questions appropriately GU:  Prostate is 2+ enlarged w/o palpable masses, asymetry or tenderness, hemoccult neg.       Assessment & Plan:  Assesment:  1. CPE- Keeping healthy checklist for men reviewed today.  BP at goal.  BMI22 = normal.BP elevated here, but at goal at home on current meds, continue.       Labs ordered- Cholesterol done 02-2010, due for CBC, PSA, CMP, Urine micro: creatinine ration Colonoscopy- normal 2005 Pt opted to continue prostate cancer  screening Continue  healthy diet, regular exercise, MVI daily. Return for next physical in 1 yr.   Needs derm visit this year-- he will call me when ready. Saw Dr Mindi Curling for CAD 01-2010.

## 2010-06-11 NOTE — Patient Instructions (Signed)
Stay on current meds. Monitor home BPs.  You should be <140/90 at rest.  Continue regular exercise with healthy diabetic, heart healthy diet.  Plan to see the dermatologist this year.  Call for referral when you are ready.  Labs downstairs today.  Will call you w/ results tomorrow.  Return for f/u Diabetes/ BP in 4 mos.

## 2010-06-12 ENCOUNTER — Telehealth: Payer: Self-pay | Admitting: Family Medicine

## 2010-06-12 LAB — MICROALBUMIN / CREATININE URINE RATIO
Creatinine, Urine: 137.6 mg/dL
Microalb Creat Ratio: 6.9 mg/g (ref 0.0–30.0)

## 2010-06-12 LAB — HEMOGLOBIN A1C: Hgb A1c MFr Bld: 5.8 % — ABNORMAL HIGH (ref <5.7)

## 2010-06-12 LAB — COMPLETE METABOLIC PANEL WITH GFR
ALT: 13 U/L (ref 0–53)
Alkaline Phosphatase: 80 U/L (ref 39–117)
Potassium: 4.3 mEq/L (ref 3.5–5.3)
Sodium: 142 mEq/L (ref 135–145)
Total Bilirubin: 0.4 mg/dL (ref 0.3–1.2)
Total Protein: 7.3 g/dL (ref 6.0–8.3)

## 2010-06-12 NOTE — Telephone Encounter (Signed)
Pls let pt know that his sugar looks great on labs with an A1C of 5.8, no sign of protein spilling in the urine, normal prostate cancer screen.  He has a very slight anemia and elevation in BUN from mild dehydration, o/w normal.  Continue current meds.  Repeat in 1 yr.

## 2010-06-12 NOTE — Telephone Encounter (Signed)
Pt aware of the above  

## 2010-10-10 ENCOUNTER — Ambulatory Visit (INDEPENDENT_AMBULATORY_CARE_PROVIDER_SITE_OTHER): Payer: Medicare Other | Admitting: Family Medicine

## 2010-10-10 ENCOUNTER — Encounter: Payer: Self-pay | Admitting: Family Medicine

## 2010-10-10 VITALS — BP 133/79 | HR 64 | Ht 68.0 in | Wt 148.0 lb

## 2010-10-10 DIAGNOSIS — I2510 Atherosclerotic heart disease of native coronary artery without angina pectoris: Secondary | ICD-10-CM

## 2010-10-10 DIAGNOSIS — E119 Type 2 diabetes mellitus without complications: Secondary | ICD-10-CM

## 2010-10-10 DIAGNOSIS — I10 Essential (primary) hypertension: Secondary | ICD-10-CM

## 2010-10-10 LAB — POCT GLYCOSYLATED HEMOGLOBIN (HGB A1C): Hemoglobin A1C: 5.7

## 2010-10-10 MED ORDER — HYDROCHLOROTHIAZIDE 25 MG PO TABS: 25.0000 mg | ORAL_TABLET | Freq: Every day | ORAL | Status: DC

## 2010-10-10 NOTE — Assessment & Plan Note (Signed)
BP at goal.  Stay on current meds.  HCTZ RFd, others thru the [place].  Labs UTD.

## 2010-10-10 NOTE — Progress Notes (Signed)
  Subjective:    Patient ID: Ryan Bernard, male    DOB: 1931-07-29, 77 y.o.   MRN: [id number]  HPI  77 yo WM presents for f/u diabetes and HTN.  He is doing good with his metformin 500 mg bid.  His AM fastings are 90-120s and denies any lows.  Had eye exam yesterday and was neg for retinopathy.  He sees Dr  Ryan Bernard for his CAD s/p CABG and goes back in Nov.  Denies any CP or DOE.  [facility].    BP 133/79  Pulse 64  Ht 5\' 8"  (1.727 m)  Wt 148 lb (67.132 kg)  BMI 22.50 kg/m2 Patient Active Problem List  Diagnoses Date Noted  . VIRAL URI 04/06/2010  . MICROALBUMINURIA 08/07/2007  . PULMONARY HYPERTENSION 04/07/2007  . DM 03/13/2007  . ESSENTIAL HYPERTENSION, BENIGN 03/13/2007  . CAD 03/13/2007  . SOLAR KERATOSIS 03/13/2007  . BENIGN PROSTATIC HYPERTROPHY, HX OF 03/13/2007      Review of Systems  Constitutional: Negative for appetite change, fatigue and unexpected weight change.  Eyes: Negative for visual disturbance.  Respiratory: Negative for cough and shortness of breath.   Cardiovascular: Negative for chest pain, palpitations and leg swelling.  Genitourinary: Negative for frequency.  Neurological: Negative for numbness.  Psychiatric/Behavioral: Negative for dysphoric mood.       Objective:   Physical Exam  Constitutional: He appears well-developed and well-nourished. No distress.  HENT:  Mouth/Throat: Oropharynx is clear and moist.  Eyes: Conjunctivae are normal.  Neck: No thyromegaly present.  Cardiovascular: Normal rate, regular rhythm, normal heart sounds and intact distal pulses.   No murmur heard. Pulmonary/Chest: Effort normal and breath sounds normal. No respiratory distress.  Musculoskeletal: He exhibits no edema.  Lymphadenopathy:    He has no cervical adenopathy.  Skin: Skin is warm and dry.  Psychiatric: He has a normal mood and affect.          Assessment & Plan:

## 2010-10-10 NOTE — Patient Instructions (Signed)
Stay on current meds.  A1C perfect at 5.7.    F/u with Dr Mindi Curling in Nov.  Return for f/u visit/ BMP in 6 mos.

## 2010-10-10 NOTE — Assessment & Plan Note (Signed)
Doing well on Metformin 500 mg bid.  A1C is 5.7 today.  His AM fastings are 90-120.  Advised to check at HS 1-2 x a wk.  Monofilament is normal today.  Umicro was normal in Sept.  Had eye exam yesterday.  Continue current meds.  Labs done 4  Mos ago.

## 2010-10-10 NOTE — Assessment & Plan Note (Signed)
Stable.  Has f/u with Dr Tonna Boehringer in Nov.

## 2010-12-28 ENCOUNTER — Encounter (INDEPENDENT_AMBULATORY_CARE_PROVIDER_SITE_OTHER): Payer: Self-pay | Admitting: *Deleted

## 2010-12-28 ENCOUNTER — Inpatient Hospital Stay
Admission: RE | Admit: 2010-12-28 | Discharge: 2010-12-28 | Disposition: A | Discharge status: Home / Self Care | Payer: Medicare Other | Source: Ambulatory Visit | Attending: Emergency Medicine | Admitting: Emergency Medicine

## 2011-02-11 NOTE — Assessment & Plan Note (Signed)
Summary: flu shot-vew  Nurse Visit   Vital Signs:  Patient profile:   77 year old male Temp:     98.2 degrees F oral  Vitals Entered By: Clemens Catholic LPN (December 28, 2010 11:02 AM)  Allergies: No Known Drug Allergies  Immunizations Administered:  Influenza Vaccine:    Vaccine Type: Fluvax 3+    Site: right deltoid    Mfr: GlaxoSmithKline    Dose: 0.5 ml    Route: IM    Given by: Clemens Catholic LPN    Exp. Date: 09/07/2011    Lot #: Sandre Kitty    VIS given: 09/10/10 version given December 28, 2010.   Immunizations Administered:  Influenza Vaccine:    Vaccine Type: Fluvax 3+    Site: right deltoid    Mfr: GlaxoSmithKline    Dose: 0.5 ml    Route: IM    Given by: Clemens Catholic LPN    Exp. Date: 09/07/2011    Lot #: Sandre Kitty    VIS given: 09/10/10 version given December 28, 2010.  Flu Vaccine Consent Questions:    Do you have a history of severe allergic reactions to this vaccine? no    Any prior history of allergic reactions to egg and/or gelatin? no    Do you have a sensitivity to the preservative Thimersol? no    Do you have a past history of Guillan-Barre Syndrome? no    Do you currently have an acute febrile illness? no    Have you ever had a severe reaction to latex? no    Vaccine information given and explained to patient? yes

## 2011-04-18 ENCOUNTER — Encounter: Payer: Self-pay | Admitting: Family Medicine

## 2011-04-18 ENCOUNTER — Ambulatory Visit (INDEPENDENT_AMBULATORY_CARE_PROVIDER_SITE_OTHER): Payer: Medicare Other | Admitting: Family Medicine

## 2011-04-18 DIAGNOSIS — J40 Bronchitis, not specified as acute or chronic: Secondary | ICD-10-CM

## 2011-04-18 DIAGNOSIS — Z0000 Encounter for general adult medical examination without abnormal findings: Secondary | ICD-10-CM

## 2011-04-18 DIAGNOSIS — Z13220 Encounter for screening for lipoid disorders: Secondary | ICD-10-CM

## 2011-04-18 DIAGNOSIS — I10 Essential (primary) hypertension: Secondary | ICD-10-CM

## 2011-04-18 DIAGNOSIS — E119 Type 2 diabetes mellitus without complications: Secondary | ICD-10-CM

## 2011-04-18 LAB — CBC WITH DIFFERENTIAL/PLATELET
Basophils Absolute: 0 10*3/uL (ref 0.0–0.1)
Basophils Relative: 0 % (ref 0–1)
HCT: 38.7 % — ABNORMAL LOW (ref 39.0–52.0)
Hemoglobin: 13.3 g/dL (ref 13.0–17.0)
Lymphocytes Relative: 36 % (ref 12–46)
MCH: 32 pg (ref 26.0–34.0)
MCHC: 34.4 g/dL (ref 30.0–36.0)
MCV: 93 fL (ref 78.0–100.0)
RBC: 4.16 MIL/uL — ABNORMAL LOW (ref 4.22–5.81)

## 2011-04-18 LAB — POCT GLYCOSYLATED HEMOGLOBIN (HGB A1C): Hemoglobin A1C: 5.3

## 2011-04-18 MED ORDER — AZITHROMYCIN 250 MG PO TABS: 500.0000 mg | ORAL_TABLET | Freq: Once | ORAL | Status: AC

## 2011-04-18 NOTE — Patient Instructions (Signed)
Upper Respiratory Infection, Adult An upper respiratory infection (URI) is also known as the common cold. It is often caused by a type of germ (virus). Colds are easily spread (contagious). You can pass it to others by kissing, coughing, sneezing, or drinking out of the same glass. Usually, you get better in 1 or 2 weeks.  HOME CARE   Only take medicine as told by your doctor.   Use a warm mist humidifier or breathe in steam from a hot shower.   Drink enough water and fluids to keep your pee (urine) clear or pale yellow.   Get plenty of rest.   Return to work when your temperature is back to normal or as told by your doctor. You may use a face mask and wash your hands to stop your cold from spreading.  GET HELP RIGHT AWAY IF:   After the first few days, you feel you are getting worse.   You have questions about your medicine.   You have chills, shortness of breath, or brown or red spit (mucus).   You have yellow or brown snot (nasal discharge) or pain in the face, especially when you bend forward.   You have a fever, puffy (swollen) neck, pain when you swallow, or white spots in the back of your throat.   You have a bad headache, ear pain, sinus pain, or chest pain.   You have a high-pitched whistling sound when you breathe in and out (wheezing).   You have a lasting cough or cough up blood.   You have sore muscles or a stiff neck.  MAKE SURE YOU:   Understand these instructions.   Will watch your condition.   Will get help right away if you are not doing well or get worse.  Document Released: 08/14/2007 Document Revised: 11/07/2010 Document Reviewed: 07/02/2010 [facility] Patient Information 2012 [place], [place].Bronchitis Bronchitis is the body's way of reacting to injury and/or infection (inflammation) of the bronchi. Bronchi are the air tubes that extend from the windpipe into the lungs. If the inflammation becomes severe, it may cause shortness of breath. CAUSES    Inflammation may be caused by:  A virus.   Germs (bacteria).   Dust.   Allergens.   Pollutants and many other irritants.  The cells lining the bronchial tree are covered with tiny hairs (cilia). These constantly beat upward, away from the lungs, toward the mouth. This keeps the lungs free of pollutants. When these cells become too irritated and are unable to do their job, mucus begins to develop. This causes the characteristic cough of bronchitis. The cough clears the lungs when the cilia are unable to do their job. Without either of these protective mechanisms, the mucus would settle in the lungs. Then you would develop pneumonia. Smoking is a common cause of bronchitis and can contribute to pneumonia. Stopping this habit is the single most important thing you can do to help yourself. TREATMENT   Your caregiver may prescribe an antibiotic if the cough is caused by bacteria. Also, medicines that open up your airways make it easier to breathe. Your caregiver may also recommend or prescribe an expectorant. It will loosen the mucus to be coughed up. Only take over-the-counter or prescription medicines for pain, discomfort, or fever as directed by your caregiver.   Removing whatever causes the problem (smoking, for example) is critical to preventing the problem from getting worse.   Cough suppressants may be prescribed for relief of cough symptoms.   Inhaled medicines  may be prescribed to help with symptoms now and to help prevent problems from returning.   For those with recurrent (chronic) bronchitis, there may be a need for steroid medicines.  SEEK IMMEDIATE MEDICAL CARE IF:   During treatment, you develop more pus-like mucus (purulent sputum).   You have a fever.   Your baby is older than 3 months with a rectal temperature of 102 F (38.9 C) or higher.   Your baby is 31 months old or younger with a rectal temperature of 100.4 F (38 C) or higher.   You become progressively more  ill.   You have increased difficulty breathing, wheezing, or shortness of breath.  It is necessary to seek immediate medical care if you are elderly or sick from any other disease. MAKE SURE YOU:   Understand these instructions.   Will watch your condition.   Will get help right away if you are not doing well or get worse.  Document Released: 02/25/2005 Document Revised: 11/07/2010 Document Reviewed: 01/05/2008 [facility] Patient Information 2012 [place], [place].

## 2011-04-18 NOTE — Progress Notes (Signed)
  Subjective:    Patient ID: Ryan Bernard, male    DOB: [birth year], 78 y.o.   MRN: [id number]  Diabetes He presents for his follow-up diabetic visit. He has type 2 diabetes mellitus. Pertinent negatives for hypoglycemia include no confusion, dizziness, headaches, seizures, speech difficulty or sweats. Pertinent negatives for diabetes include no chest pain. Diabetic complications include heart disease. Pertinent negatives for diabetic complications include no CVA. (Bypass) Current diabetic treatment includes diet. There is no change in his home blood glucose trend.  Cough This is a new problem. The current episode started in the past 7 days. The problem has been gradually worsening. The problem occurs constantly. The cough is productive of sputum. Associated symptoms include nasal congestion, postnasal drip, a rash and a sore throat. Pertinent negatives include no chest pain, ear congestion, ear pain, headaches, shortness of breath, sweats or wheezing. Associated symptoms comments: Chest sorness. The symptoms are aggravated by nothing. He has tried nothing for the symptoms. There is no history of asthma, bronchiectasis, bronchitis, COPD or emphysema.      Review of Systems  HENT: Positive for sore throat and postnasal drip. Negative for ear pain.   Respiratory: Positive for cough. Negative for shortness of breath and wheezing.   Cardiovascular: Negative for chest pain.  Skin: Positive for rash.  Neurological: Negative for dizziness, seizures, speech difficulty and headaches.  Psychiatric/Behavioral: Negative for confusion.  All other systems reviewed and are negative.      BP 133/72  Pulse 67  Temp(Src) 98 F (36.7 C) (Oral)  Wt 149 lb (67.586 kg) Objective:   Physical Exam  Constitutional: He is oriented to person, place, and time. He appears well-developed and well-nourished.       Elderly white male  HENT:  Head: Normocephalic.  Right Ear: External ear normal.  Left Ear: External  ear normal.  Mouth/Throat: Oropharynx is clear and moist.  Neck: Neck supple. No tracheal deviation present.  Cardiovascular: Normal rate and normal heart sounds.   Pulmonary/Chest: Effort normal and breath sounds normal. No respiratory distress. He has no wheezes.  Musculoskeletal: Normal range of motion.  Lymphadenopathy:    He has cervical adenopathy.  Neurological: He is alert and oriented to person, place, and time.  Skin: Skin is warm and dry.  Psychiatric: He has a normal mood and affect.          Results for orders placed in visit on 04/18/11  POCT GLYCOSYLATED HEMOGLOBIN (HGB A1C)      Component Value Range   Hemoglobin A1C 5.3     Assessment & Plan:   #1 diabetes under excellent control. Continue with the DiaBeta 2.5 mg one tablet a day.  #2 hypertension blood pressure appears under good control with Toprol Hytrin and Lotensin and hydrochlorothiazide continue those 4 medications.  #3 URI bronchitis. We'll place on Z-Pak as directed return in 5-7 days not better with improved. Return to followup in 3-4 months.

## 2011-04-19 LAB — COMPLETE METABOLIC PANEL WITH GFR
ALT: 11 U/L (ref 0–53)
AST: 20 U/L (ref 0–37)
Creat: 1.27 mg/dL (ref 0.50–1.35)
Sodium: 143 mEq/L (ref 135–145)
Total Bilirubin: 0.4 mg/dL (ref 0.3–1.2)
Total Protein: 7.2 g/dL (ref 6.0–8.3)

## 2011-04-29 LAB — LIPID PANEL
HDL: 54 mg/dL (ref >39)
Triglycerides: 68 mg/dL (ref <150)

## 2011-04-29 NOTE — Progress Notes (Signed)
Addended by: Ellsworth Lennox on: 04/29/2011 09:26 AM   Modules accepted: Orders

## 2011-08-20 ENCOUNTER — Ambulatory Visit (INDEPENDENT_AMBULATORY_CARE_PROVIDER_SITE_OTHER): Payer: Medicare Other | Admitting: Family Medicine

## 2011-08-20 ENCOUNTER — Encounter: Payer: Self-pay | Admitting: Family Medicine

## 2011-08-20 VITALS — BP 160/84 | HR 81 | Ht 68.0 in | Wt 155.0 lb

## 2011-08-20 DIAGNOSIS — I10 Essential (primary) hypertension: Secondary | ICD-10-CM

## 2011-08-20 DIAGNOSIS — Z1331 Encounter for screening for depression: Secondary | ICD-10-CM

## 2011-08-20 DIAGNOSIS — Z23 Encounter for immunization: Secondary | ICD-10-CM

## 2011-08-20 DIAGNOSIS — Z9181 History of falling: Secondary | ICD-10-CM

## 2011-08-20 DIAGNOSIS — E119 Type 2 diabetes mellitus without complications: Secondary | ICD-10-CM

## 2011-08-20 DIAGNOSIS — E785 Hyperlipidemia, unspecified: Secondary | ICD-10-CM

## 2011-08-20 LAB — POCT GLYCOSYLATED HEMOGLOBIN (HGB A1C): Hemoglobin A1C: 6

## 2011-08-20 MED ORDER — ZOSTER VACCINE LIVE 19400 UNT/0.65ML ~~LOC~~ SOLR: 0.6500 mL | Freq: Once | SUBCUTANEOUS | Status: AC

## 2011-08-20 MED ORDER — ZOSTER VACCINE LIVE 19400 UNT/0.65ML ~~LOC~~ SOLR: 0.6500 mL | Freq: Once | SUBCUTANEOUS | Status: DC

## 2011-08-20 NOTE — Progress Notes (Signed)
Subjective:    Patient ID: Ryan Bernard, male    DOB: 1932/01/28, 78 y.o.   MRN: [id number]  Hypertension This is a chronic problem. The current episode started more than 1 year ago. The problem is unchanged. The problem is uncontrolled (He reports forgetting his blood pressure medicine last night and this morning taking about his visit). Associated symptoms include blurred vision. Pertinent negatives include no anxiety, malaise/fatigue, neck pain, orthopnea, peripheral edema or shortness of breath. There are no associated agents to hypertension. Risk factors for coronary artery disease include male gender, dyslipidemia, diabetes mellitus and family history (Patient is status bypass). Past treatments include beta blockers, diuretics and central alpha agonists. Compliance problems: Taking medications.  Hypertensive end-organ damage includes CAD/MI.  Hyperlipidemia This is a chronic (He is on Pravachol in 3 months ago his cholesterol was acceptable.) problem. The problem is controlled. Exacerbating diseases include diabetes. Pertinent negatives include no shortness of breath. Current antihyperlipidemic treatment includes statins. The current treatment provides moderate improvement of lipids. There are no compliance problems.  Risk factors for coronary artery disease include hypertension, male sex and dyslipidemia.   Immunization update. Patient needs varicella vaccination, pneumonia vaccination. He reports having a colonoscopy in 2005. His next colonoscopy will be due in 2015 but he will need to check with the GI doctors to make sure they want to repeat it when he is 82.  Diabetes. Patient has a history of diabetes last A1c was 5 and we stopped the DiaBeta 2.5 mg daily. We will need to repeat his A1c.  Fall and depression screening needs to be done.  Review of Systems  Constitutional: Negative for malaise/fatigue.  HENT: Negative for neck pain.   Eyes: Positive for blurred vision.  Respiratory:  Negative for cough and shortness of breath.   Cardiovascular: Negative for orthopnea.      BP 160/84  Pulse 81  Ht 5\' 8"  (1.727 m)  Wt 155 lb (70.308 kg)  BMI 23.57 kg/m2  SpO2 96% Objective:   Physical Exam  Constitutional: He is oriented to person, place, and time. He appears well-developed and well-nourished.  HENT:  Head: Normocephalic.  Neck: Neck supple.  Cardiovascular: Normal rate, regular rhythm and normal heart sounds.   Pulmonary/Chest: Effort normal.  Musculoskeletal:       Scar along the chest is present where he had open heart surgery.  Neurological: He is alert and oriented to person, place, and time.  Skin: Skin is warm and dry.  Psychiatric: He has a normal mood and affect. His behavior is normal.   Fall assessment done and patient had a score of 4 PH Q.-9 done and 0 score   Results for orders placed in visit on 04/18/11  POCT GLYCOSYLATED HEMOGLOBIN (HGB A1C)      Component Value Range   Hemoglobin A1C 5.3    COMPLETE METABOLIC PANEL WITH GFR      Component Value Range   Sodium 143  135 - 145 (mEq/L)   Potassium 4.1  3.5 - 5.3 (mEq/L)   Chloride 101  96 - 112 (mEq/L)   CO2 32  19 - 32 (mEq/L)   Glucose, Bld 68 (*) 70 - 99 (mg/dL)   BUN 24 (*) 6 - 23 (mg/dL)   Creat 4.09  8.11 - 9.14 (mg/dL)   Total Bilirubin 0.4  0.3 - 1.2 (mg/dL)   Alkaline Phosphatase 74  39 - 117 (U/L)   AST 20  0 - 37 (U/L)   ALT 11  0 -  53 (U/L)   Total Protein 7.2  6.0 - 8.3 (g/dL)   Albumin 4.2  3.5 - 5.2 (g/dL)   Calcium 9.5  8.4 - 09.8 (mg/dL)   GFR, Est African American 63     GFR, Est Non African American 54 (*)   CBC WITH DIFFERENTIAL      Component Value Range   WBC 4.7  4.0 - 10.5 (K/uL)   RBC 4.16 (*) 4.22 - 5.81 (MIL/uL)   Hemoglobin 13.3  13.0 - 17.0 (g/dL)   HCT 11.9 (*) 14.7 - 52.0 (%)   MCV 93.0  78.0 - 100.0 (fL)   MCH 32.0  26.0 - 34.0 (pg)   MCHC 34.4  30.0 - 36.0 (g/dL)   RDW 82.9  56.2 - 13.0 (%)   Platelets 136 (*) 150 - 400 (K/uL)   Neutrophils  Relative 53  43 - 77 (%)   Neutro Abs 2.5  1.7 - 7.7 (K/uL)   Lymphocytes Relative 36  12 - 46 (%)   Lymphs Abs 1.7  0.7 - 4.0 (K/uL)   Monocytes Relative 9  3 - 12 (%)   Monocytes Absolute 0.4  0.1 - 1.0 (K/uL)   Eosinophils Relative 3  0 - 5 (%)   Eosinophils Absolute 0.1  0.0 - 0.7 (K/uL)   Basophils Relative 0  0 - 1 (%)   Basophils Absolute 0.0  0.0 - 0.1 (K/uL)   Smear Review Criteria for review not met    LIPID PANEL      Component Value Range   Cholesterol 148  0 - 200 (mg/dL)   Triglycerides 68  <865 (mg/dL)   HDL 54  >78 (mg/dL)   Total CHOL/HDL Ratio 2.7     VLDL 14  0 - 40 (mg/dL)   LDL Cholesterol 80  0 - 99 (mg/dL)      Lab Results  Component Value Date   HGBA1C 5.3 04/18/2011    Results for orders placed in visit on 08/20/11  POCT GLYCOSYLATED HEMOGLOBIN (HGB A1C)      Component Value Range   Hemoglobin A1C 6.0      Assessment & Plan:   #1 hypertension poorly controlled right now but the patient missed his medication would not make any changes at this time.  #2 fall assessment low risk at this time #3 depression screen essentially negative  #4Hyperlipidemia. Has been under good control no change the Pravachol. #5 diabetes. A1c has gone from 5.3 in 4 months to 6. I have recommended he goes back on his DiaBeta 2.5 mg tablet but instead of one tablet he takes a half a tablet and return in 4 months for repeat A1c. May also get cholesterol rechecked at that time. #6 immunizations up date. Patient will be given pneumonia vaccination and a prescription written for varicella vaccination as well. [facility] the [place].

## 2011-08-20 NOTE — Patient Instructions (Signed)
Varicella Virus Vaccine Live injection What is this medicine? VARICELLA VIRUS VACCINE (var uh SEL uh VAHY ruhs vak SEEN) is used to prevent infections of chickpox. This medicine may be used for other purposes; ask your health care provider or pharmacist if you have questions. What should I tell my health care provider before I take this medicine? They need to know if you have any of the following conditions: -blood disorders or disease -cancer like leukemia or lymphoma -immune system problems or therapy -infection with fever -recent immune globulin therapy -tuberculosis -an unusual or allergic reaction to vaccines, neomycin, gelatin, other medicines, foods, dyes, or preservatives -pregnant or trying to get pregnant -breast-feeding How should I use this medicine? This vaccine is for injection under the skin. It is given by a health care professional. A copy of Vaccine Information Statements will be given before each vaccination. Read this sheet carefully each time. The sheet may change frequently. Talk to your pediatrician regarding the use of this medicine in children. While this drug may be prescribed for children as young as 12 months of age for selected conditions, precautions do apply. Overdosage: If you think you have taken too much of this medicine contact a poison control center or emergency room at once. NOTE: This medicine is only for you. Do not share this medicine with others. What if I miss a dose? Keep appointments for follow-up (booster) doses as directed. It is important not to miss your dose. Call your doctor or health care professional if you are unable to keep an appointment. What may interact with this medicine? Do not take this medicine with any of the following medications: -adalimumab -anakinra -etanercept -infliximab -medicines that suppress your immune system This medicine may also interact with the following medications: -aspirin and aspirin-like  medicines -blood transfusions -immunoglobulins -medicines to treat cancer -steroid medicines like prednisone or cortisone This list may not describe all possible interactions. Give your health care provider a list of all the medicines, herbs, non-prescription drugs, or dietary supplements you use. Also tell them if you smoke, drink alcohol, or use illegal drugs. Some items may interact with your medicine. What should I watch for while using this medicine? Visit your doctor for regular check ups. This vaccine, like all vaccines, may not fully protect everyone. After receiving this vaccine it may be possible to pass chickenpox infection to others. For up to 6 weeks, avoid people with immune system problems, pregnant women who have not had chickenpox, and newborns of women who have not had chickenpox. Talk to your doctor for more information. Do not become pregnant for 3 months after taking this vaccine. Women should inform their doctor if they wish to become pregnant or think they might be pregnant. There is a potential for serious side effects to an unborn child. Talk to your health care professional or pharmacist for more information. What side effects may I notice from receiving this medicine? Side effects that you should report to your doctor or health care professional as soon as possible: -allergic reactions like skin rash, itching or hives, swelling of the face, lips, or tongue -breathing problems -extreme changes in behavior -feeling faint or lightheaded, falls -fever over 102 degrees F -pain, tingling, numbness in the hands or feet -redness, blistering, peeling or loosening of the skin, including inside the mouth -seizures -unusually weak or tired Side effects that usually do not require medical attention (report to your doctor or health care professional if they continue or are bothersome): -aches or pains -  chickenpox-like rash -diarrhea -low-grade fever under 102 degrees F -loss  of appetite -nausea, vomiting -redness, pain, swelling at site where injected -sleepy -trouble sleeping This list may not describe all possible side effects. Call your doctor for medical advice about side effects. You may report side effects to FDA at 1-800-FDA-1088. Where should I keep my medicine? This drug is given in a hospital or clinic and will not be stored at home. NOTE: This sheet is a summary. It may not cover all possible information. If you have questions about this medicine, talk to your doctor, pharmacist, or health care provider.  2012, Elsevier/Gold Standard. (11/23/2007 5:19:05 PM)Pneumococcal Vaccine, Polyvalent solution for injection What is this medicine? PNEUMOCOCCAL VACCINE, POLYVALENT (NEU mo KOK al vak SEEN, pol ee VEY luhnt) is a vaccine to prevent pneumococcus bacteria infection. These bacteria are a major cause of ear infections, Strep throat infections, and serious pneumonia, meningitis, or blood infections worldwide. These vaccines help the body to produce antibodies (protective substances) that help your body defend against these bacteria. This vaccine is recommended for people 2 years of age and older with health problems. It is also recommended for all adults over 50 years old. This vaccine will not treat an infection. This medicine may be used for other purposes; ask your health care provider or pharmacist if you have questions. What should I tell my health care provider before I take this medicine? They need to know if you have any of these conditions: -bleeding problems -bone marrow or organ transplant -cancer, Hodgkin's disease -fever -infection -immune system problems -low platelet count in the blood -seizures -an unusual or allergic reaction to pneumococcal vaccine, diphtheria toxoid, other vaccines, latex, other medicines, foods, dyes, or preservatives -pregnant or trying to get pregnant -breast-feeding How should I use this medicine? This vaccine is  for injection into a muscle or under the skin. It is given by a health care professional. A copy of Vaccine Information Statements will be given before each vaccination. Read this sheet carefully each time. The sheet may change frequently. Talk to your pediatrician regarding the use of this medicine in children. While this drug may be prescribed for children as young as 2 years of age for selected conditions, precautions do apply. Overdosage: If you think you have taken too much of this medicine contact a poison control center or emergency room at once. NOTE: This medicine is only for you. Do not share this medicine with others. What if I miss a dose? It is important not to miss your dose. Call your doctor or health care professional if you are unable to keep an appointment. What may interact with this medicine? -medicines for cancer chemotherapy -medicines that suppress your immune function -medicines that treat or prevent blood clots like warfarin, enoxaparin, and dalteparin -steroid medicines like prednisone or cortisone This list may not describe all possible interactions. Give your health care provider a list of all the medicines, herbs, non-prescription drugs, or dietary supplements you use. Also tell them if you smoke, drink alcohol, or use illegal drugs. Some items may interact with your medicine. What should I watch for while using this medicine? Mild fever and pain should go away in 3 days or less. Report any unusual symptoms to your doctor or health care professional. What side effects may I notice from receiving this medicine? Side effects that you should report to your doctor or health care professional as soon as possible: -allergic reactions like skin rash, itching or hives, swelling of the   face, lips, or tongue -breathing problems -confused -fever over 102 degrees F -pain, tingling, numbness in the hands or feet -seizures -unusual bleeding or bruising -unusual muscle  weakness Side effects that usually do not require medical attention (report to your doctor or health care professional if they continue or are bothersome): -aches and pains -diarrhea -fever of 102 degrees F or less -headache -irritable -loss of appetite -pain, tender at site where injected -trouble sleeping This list may not describe all possible side effects. Call your doctor for medical advice about side effects. You may report side effects to FDA at 1-800-FDA-1088. Where should I keep my medicine? This does not apply. This vaccine is given in a clinic, pharmacy, doctor's office, or other health care setting and will not be stored at home. NOTE: This sheet is a summary. It may not cover all possible information. If you have questions about this medicine, talk to your doctor, pharmacist, or health care provider.  2012, Elsevier/Gold Standard. (10/02/2007 2:32:37 PM) 

## 2011-10-16 ENCOUNTER — Other Ambulatory Visit: Payer: Self-pay | Admitting: *Deleted

## 2011-10-16 MED ORDER — HYDROCHLOROTHIAZIDE 25 MG PO TABS: 25.0000 mg | ORAL_TABLET | Freq: Every day | ORAL | Status: DC

## 2011-12-24 ENCOUNTER — Encounter: Payer: Self-pay | Admitting: Family Medicine

## 2011-12-24 ENCOUNTER — Ambulatory Visit (INDEPENDENT_AMBULATORY_CARE_PROVIDER_SITE_OTHER): Payer: Medicare Other | Admitting: Family Medicine

## 2011-12-24 VITALS — BP 146/76 | HR 61 | Ht 68.0 in | Wt 150.0 lb

## 2011-12-24 DIAGNOSIS — E785 Hyperlipidemia, unspecified: Secondary | ICD-10-CM

## 2011-12-24 DIAGNOSIS — Z23 Encounter for immunization: Secondary | ICD-10-CM

## 2011-12-24 DIAGNOSIS — E119 Type 2 diabetes mellitus without complications: Secondary | ICD-10-CM

## 2011-12-24 DIAGNOSIS — L989 Disorder of the skin and subcutaneous tissue, unspecified: Secondary | ICD-10-CM

## 2011-12-24 DIAGNOSIS — I10 Essential (primary) hypertension: Secondary | ICD-10-CM

## 2011-12-24 LAB — POCT GLYCOSYLATED HEMOGLOBIN (HGB A1C): Hemoglobin A1C: 5.5

## 2011-12-24 MED ORDER — FLUOCINONIDE 0.05 % EX CREA: TOPICAL_CREAM | Freq: Two times a day (BID) | CUTANEOUS | Status: DC | PRN

## 2011-12-24 MED ORDER — AMBULATORY NON FORMULARY MEDICATION: Status: DC

## 2011-12-24 NOTE — Progress Notes (Signed)
  Subjective:    Patient ID: Ranon Coven, male    DOB: 02-07-1932, 78 y.o.   MRN: [id number]  HPI #1 diabetes. We have stopped his DiaBeta completely in the past but restarted it last visit and a half a tablet of 2.5 mg due to his A1c going back up. #2 hypertension. Systolic blood pressure was up 160 last visit. #3 hyperlipidemia. He is on Pravachol but we've not gotten any cholesterol results lately. He is getting his lab work done at the [place] #4 flu vaccinations #5 needs varicella vaccination #6 health maintenance. He reports needing his colonoscopy in 2-3 years at 28 performed in may not want to do anything. Also we talked about PSAs and because of his age he is comfortable in not Following that closely anymore. #7 irritation of over his nose and around his eyes. He has a history of solar keratosis before.  Review of Systems  Constitutional: Negative for chills, activity change and appetite change.  Musculoskeletal: Negative for back pain, arthralgias and gait problem.  Neurological: Negative for dizziness and headaches.  All other systems reviewed and are negative.     BP 146/76  Pulse 61  Ht 5\' 8"  (1.727 m)  Wt 150 lb (68.04 kg)  BMI 22.81 kg/m2  SpO2 97% Objective:   Physical Exam  Vitals reviewed. Constitutional: He is oriented to person, place, and time. He appears well-developed and well-nourished.       Elderly WM  HENT:  Head: Normocephalic.  Neck: Neck supple. No tracheal deviation present.  Cardiovascular: Normal rate and regular rhythm.   Pulmonary/Chest: Effort normal and breath sounds normal.  Neurological: He is alert and oriented to person, place, and time.  Skin: Rash noted.       Skin irritation around his right eye and over his right nose consistent with keratosis.  Psychiatric: He has a normal mood and affect. His behavior is normal.       Results for orders placed in visit on 12/24/11  POCT GLYCOSYLATED HEMOGLOBIN (HGB A1C)      Component Value Range     Hemoglobin A1C 5.5     Assessment & Plan:  #1 diabetes. Under good control with half tablet of DiaBeta 2.5 mg. #2 hyperlipidemia. He gets his cholesterol checked at the [place]. His appointment in November I have asked him to have them forward the information to me.  #3 hypertension/history of heart disease. Blood pressure while slightly higher systolically better than it was before continue with the 4 blood pressure medications. It should be noted that the Lotensin,  Hytrin, HydroDIURIL Rx are at maximum dosage and only the Toprol could be raised if needed. #4 flu vaccination will be given. #5 zoster varicella vaccination will be rewritten for him again #6 skin rash/solar keratosis. We'll renew patient's generic Lidex cream to apply once or twice a day.  Return in 4 months followup.

## 2011-12-24 NOTE — Patient Instructions (Signed)
Varicella Virus Vaccine Live injection What is this medicine? VARICELLA VIRUS VACCINE (var uh SEL uh VAHY ruhs vak SEEN) is used to prevent infections of chickpox. This medicine may be used for other purposes; ask your health care provider or pharmacist if you have questions. What should I tell my health care provider before I take this medicine? They need to know if you have any of the following conditions: -blood disorders or disease -cancer like leukemia or lymphoma -immune system problems or therapy -infection with fever -recent immune globulin therapy -tuberculosis -an unusual or allergic reaction to vaccines, neomycin, gelatin, other medicines, foods, dyes, or preservatives -pregnant or trying to get pregnant -breast-feeding How should I use this medicine? This vaccine is for injection under the skin. It is given by a health care professional. A copy of Vaccine Information Statements will be given before each vaccination. Read this sheet carefully each time. The sheet may change frequently. Talk to your pediatrician regarding the use of this medicine in children. While this drug may be prescribed for children as young as 44 months of age for selected conditions, precautions do apply. Overdosage: If you think you have taken too much of this medicine contact a poison control center or emergency room at once. NOTE: This medicine is only for you. Do not share this medicine with others. What if I miss a dose? Keep appointments for follow-up (booster) doses as directed. It is important not to miss your dose. Call your doctor or health care professional if you are unable to keep an appointment. What may interact with this medicine? Do not take this medicine with any of the following medications: -adalimumab -anakinra -etanercept -infliximab -medicines that suppress your immune system This medicine may also interact with the following medications: -aspirin and aspirin-like  medicines -blood transfusions -immunoglobulins -medicines to treat cancer -steroid medicines like prednisone or cortisone This list may not describe all possible interactions. Give your health care provider a list of all the medicines, herbs, non-prescription drugs, or dietary supplements you use. Also tell them if you smoke, drink alcohol, or use illegal drugs. Some items may interact with your medicine. What should I watch for while using this medicine? Visit your doctor for regular check ups. This vaccine, like all vaccines, may not fully protect everyone. After receiving this vaccine it may be possible to pass chickenpox infection to others. For up to 6 weeks, avoid people with immune system problems, pregnant women who have not had chickenpox, and newborns of women who have not had chickenpox. Talk to your doctor for more information. Do not become pregnant for 3 months after taking this vaccine. Women should inform their doctor if they wish to become pregnant or think they might be pregnant. There is a potential for serious side effects to an unborn child. Talk to your health care professional or pharmacist for more information. What side effects may I notice from receiving this medicine? Side effects that you should report to your doctor or health care professional as soon as possible: -allergic reactions like skin rash, itching or hives, swelling of the face, lips, or tongue -breathing problems -extreme changes in behavior -feeling faint or lightheaded, falls -fever over 102 degrees F -pain, tingling, numbness in the hands or feet -redness, blistering, peeling or loosening of the skin, including inside the mouth -seizures -unusually weak or tired Side effects that usually do not require medical attention (report to your doctor or health care professional if they continue or are bothersome): -aches or pains -  chickenpox-like rash -diarrhea -low-grade fever under 102 degrees F -loss  of appetite -nausea, vomiting -redness, pain, swelling at site where injected -sleepy -trouble sleeping This list may not describe all possible side effects. Call your doctor for medical advice about side effects. You may report side effects to FDA at 1-800-FDA-1088. Where should I keep my medicine? This drug is given in a hospital or clinic and will not be stored at home. NOTE: This sheet is a summary. It may not cover all possible information. If you have questions about this medicine, talk to your doctor, pharmacist, or health care provider.  2012, Elsevier/Gold Standard. (11/23/2007 5:19:05 PM)Shingles Shingles is caused by the same virus that causes chickenpox (varicella zoster virus or VZV). Shingles often occurs many years or decades after having chickenpox. That is why it is more common in adults older than 50 years. The virus reactivates and breaks out as an infection in a nerve root. SYMPTOMS   The initial feeling (sensations) may be pain. This pain is usually described as:  Burning.  Stabbing.  Throbbing.  Tingling in the nerve root.  A red rash will follow in a couple days. The rash may occur in any area of the body and is usually on one side (unilateral) of the body in a band or belt-like pattern. The rash usually starts out as very small blisters (vesicles). They will dry up after 7 to 10 days. This is not usually a significant problem except for the pain it causes.  Long-lasting (chronic) pain is more likely in an elderly person. It can last months to years. This condition is called postherpetic neuralgia. Shingles can be an extremely severe infection in someone with AIDS, a weakened immune system, or with forms of leukemia. It can also be severe if you are taking transplant medicines or other medicines that weaken the immune system. TREATMENT  Your caregiver will often treat you with:  Antiviral drugs.  Anti-inflammatory drugs.  Pain medicines. Bed rest is very  important in preventing the pain associated with herpes zoster (postherpetic neuralgia). Application of heat in the form of a hot water bottle or electric heating pad or gentle pressure with the hand is recommended to help with the pain or discomfort. PREVENTION  A varicella zoster vaccine is available to help protect against the virus. The Food and Drug Administration approved the varicella zoster vaccine for individuals 34 years of age and older. HOME CARE INSTRUCTIONS   Cool compresses to the area of rash may be helpful.  Only take over-the-counter or prescription medicines for pain, discomfort, or fever as directed by your caregiver.  Avoid contact with:  Babies.  Pregnant women.  Children with eczema.  Elderly people with transplants.  People with chronic illnesses, such as leukemia and AIDS.  If the area involved is on your face, you may receive a referral for follow-up to a specialist. It is very important to keep all follow-up appointments. This will help avoid eye complications, chronic pain, or disability. SEEK IMMEDIATE MEDICAL CARE IF:   You develop any pain (headache) in the area of the face or eye. This must be followed carefully by your caregiver or ophthalmologist. An infection in part of your eye (cornea) can be very serious. It could lead to blindness.  You do not have pain relief from prescribed medicines.  Your redness or swelling spreads.  The area involved becomes very swollen and painful.  You have a fever.  You notice any red or painful lines extending away  from the affected area toward your heart (lymphangitis).  Your condition is worsening or has changed. Document Released: 02/25/2005 Document Revised: 05/20/2011 Document Reviewed: 01/30/2009 [facility] Patient Information 2013 [place], [place].

## 2012-05-05 ENCOUNTER — Ambulatory Visit (INDEPENDENT_AMBULATORY_CARE_PROVIDER_SITE_OTHER): Payer: Medicare Other | Admitting: Family Medicine

## 2012-05-05 ENCOUNTER — Ambulatory Visit: Payer: Medicare Other | Admitting: Family Medicine

## 2012-05-05 ENCOUNTER — Encounter: Payer: Self-pay | Admitting: Family Medicine

## 2012-05-05 VITALS — BP 129/71 | HR 63 | Ht 68.0 in | Wt 153.0 lb

## 2012-05-05 DIAGNOSIS — I10 Essential (primary) hypertension: Secondary | ICD-10-CM

## 2012-05-05 DIAGNOSIS — Z79899 Other long term (current) drug therapy: Secondary | ICD-10-CM

## 2012-05-05 DIAGNOSIS — Z951 Presence of aortocoronary bypass graft: Secondary | ICD-10-CM

## 2012-05-05 DIAGNOSIS — I2510 Atherosclerotic heart disease of native coronary artery without angina pectoris: Secondary | ICD-10-CM

## 2012-05-05 DIAGNOSIS — Z5181 Encounter for therapeutic drug level monitoring: Secondary | ICD-10-CM

## 2012-05-05 DIAGNOSIS — E119 Type 2 diabetes mellitus without complications: Secondary | ICD-10-CM

## 2012-05-05 DIAGNOSIS — Z87898 Personal history of other specified conditions: Secondary | ICD-10-CM

## 2012-05-05 DIAGNOSIS — E785 Hyperlipidemia, unspecified: Secondary | ICD-10-CM | POA: Insufficient documentation

## 2012-05-05 LAB — LIPID PANEL
HDL: 51 mg/dL (ref >39)
Total CHOL/HDL Ratio: 3.3 Ratio
Triglycerides: 63 mg/dL (ref <150)

## 2012-05-05 LAB — HEMOGLOBIN A1C
Hgb A1c MFr Bld: 6.2 % — ABNORMAL HIGH (ref <5.7)
Mean Plasma Glucose: 131 mg/dL — ABNORMAL HIGH (ref <117)

## 2012-05-05 LAB — COMPLETE METABOLIC PANEL WITH GFR
AST: 15 U/L (ref 0–37)
Albumin: 4.1 g/dL (ref 3.5–5.2)
Alkaline Phosphatase: 76 U/L (ref 39–117)
Calcium: 9.5 mg/dL (ref 8.4–10.5)
Chloride: 101 mEq/L (ref 96–112)
Potassium: 4.2 mEq/L (ref 3.5–5.3)
Sodium: 141 mEq/L (ref 135–145)
Total Protein: 7.1 g/dL (ref 6.0–8.3)

## 2012-05-05 NOTE — Progress Notes (Addendum)
CC: Ryan Bernard is a 79 y.o. male is here for Diabetes   Subjective: HPI:  Followup type 2 diabetes: [facility] the decision was made to stop a sulfonylurea. He is not currently taking any antihyperglycemic medication. No outside blood sugars to report. Denies polyuria polyphasia or polydipsia. Denies poorly healing wounds nor foot lesions. Denies motor or sensory disturbances. He is taking an aspirin daily and is on a statin. Last A1c 5.5 while on sulfonylurea. Overdue for urine microalbumin.  Followup hypertension: No outside blood pressures to report. Continues to take enalapril, hydrochlorothiazide, metoprolol, terazosin. Denies orthostatic like symptoms. Denies motor or sensory disturbances, chest pain, shortness of breath, orthopnea, peripheral edema, angioedema, cough, nor PND  Followup CAD: Approximately 2 years ago had triple bypass. Since then denies exertional chest pain nor limb claudication nor limb rest pain. He is taking an aspirin a day. He is unsure what his most recent LDL was.  Followup BPH: Continues to take terazosin on a daily basis. Wakes 1 time a night to urinate. Denies sensation of incomplete bladder emptying, straining to urinate, weak stream, nor urinary incontinence.    Review Of Systems Outlined In HPI  Past Medical History  Diagnosis Date  . CAD (coronary artery disease) 1995    w/ angioplasty  . Hypertension   . Hyperlipidemia   . Anemia   . BPH (benign prostatic hyperplasia)   . Heart disease     Mild valvular w/ pulm HTN  . SBE (subacute bacterial endocarditis) prophylaxis candidate   . Diabetes mellitus 2004    type 2     Family History  Problem Relation Age of Onset  . Heart disease Mother   . Hypertension Mother   . Alcohol abuse Father   . Depression Daughter   . Depression Son      History  Substance Use Topics  . Smoking status: Never Smoker   . Smokeless tobacco: Not on file  . Alcohol Use: No      Objective: Filed Vitals:   05/05/12 0935  BP: 129/71  Pulse: 63    General: Alert and Oriented, No Acute Distress HEENT: Pupils equal, round, reactive to light. Conjunctivae clear.  Moist mucous membranes, pharynx without inflammation nor lesions.  Neck supple without palpable lymphadenopathy nor abnormal masses. Lungs: Clear to auscultation bilaterally, no wheezing/ronchi/rales.  Comfortable work of breathing. Good air movement. Cardiac: Regular rate and rhythm. Normal S1/S2.  No murmurs, rubs, nor gallops.   Abdomen: Soft and nontender Extremities: No peripheral edema.  Strong peripheral pulses.  Feet: Dorsalis pedis pulses 1+ bilaterally.  Monofilament sensation intact on plantar and dorsal surface bilaterally.    No signs of infection, skin breakdown, nor ulceration. Mental Status: No depression, anxiety, nor agitation. Skin: Warm and dry.  Assessment & Plan: Ryan Bernard was seen today for diabetes.  Diagnoses and associated orders for this visit:  Diabetes mellitus type 2 in nonobese - Cancel: POCT HgB A1C - Cancel: POCT UA - Microalbumin - Microalbumin / creatinine urine ratio - HgB A1c  DM - Lipid panel  Essential hypertension, benign  CAD  Hyperlipidemia LDL goal < 70 - Lipid panel - COMPLETE METABOLIC PANEL WITH GFR  BENIGN PROSTATIC HYPERTROPHY, HX OF  S/P CABG x 5  Encounter for monitoring statin therapy - COMPLETE METABOLIC PANEL WITH GFR    Type 2 diabetes: Clinically controled, unable to do fingerstick A1c due to machine malfunction. If A1c is less than 6.5 we'll hold off on refills of  his sulfonylurea. Continue daily aspirin, ACE inhibitor, statin. Exam today normal. Continue annual eye exams. Urine microalbumin sent. Essential hypertension: Controlled and stable, continue current antihypertensive regimen. Hyperlipidemia: Clinically controlled at due for fasting lipid panel. Goal LDL less than 70 will adjust statin as needed. Checking liver  enzymes Coronary artery disease status post CABG: Stable, continue beta blocker, aspirin, statin BPH: Controlled and stable, continue alpha-blocker  Patient request [facility] of [facility].  Return in about 3 months (around 08/02/2012).

## 2012-05-06 ENCOUNTER — Telehealth: Payer: Self-pay | Admitting: Family Medicine

## 2012-05-06 ENCOUNTER — Encounter: Payer: Self-pay | Admitting: Family Medicine

## 2012-05-06 DIAGNOSIS — E119 Type 2 diabetes mellitus without complications: Secondary | ICD-10-CM

## 2012-05-06 DIAGNOSIS — N183 Chronic kidney disease, stage 3 unspecified: Secondary | ICD-10-CM

## 2012-05-06 DIAGNOSIS — E785 Hyperlipidemia, unspecified: Secondary | ICD-10-CM

## 2012-05-06 MED ORDER — PRAVASTATIN SODIUM 80 MG PO TABS: 80.0000 mg | ORAL_TABLET | Freq: Every day | ORAL | Status: DC

## 2012-05-06 NOTE — Telephone Encounter (Signed)
Pt notified, rx and labs placed up front

## 2012-05-06 NOTE — Telephone Encounter (Signed)
Sue Lush, Will you please let Mr. Kedzierski know that his A1c was 6.2 with a goal of less than 7.0 which he has clearly met.  Given that he's at this level even without taking any diabetic medication i think this is fantastic news.  I would encourage him to continue to hold off on taking any medications for diabetes.  His urine studies did not show any abnormalities. His LDL cholesterol goal is less than 70 and it's climbed up to 105.  There's a good chance that an increase in his pravachol/pravastatin can help him get back to below 70.  I've printed off an Rx for [facility] to [facility] or at any pharmacy of his choice.  I've also printed off labs for him to share with the [place].  I'd like him to follow up with me or his [facility] doctor in three months to recheck cholesterol.

## 2012-08-06 ENCOUNTER — Encounter: Payer: Self-pay | Admitting: Family Medicine

## 2012-08-06 ENCOUNTER — Ambulatory Visit (INDEPENDENT_AMBULATORY_CARE_PROVIDER_SITE_OTHER): Payer: Medicare Other | Admitting: Family Medicine

## 2012-08-06 VITALS — BP 132/75 | HR 59 | Wt 150.0 lb

## 2012-08-06 DIAGNOSIS — I10 Essential (primary) hypertension: Secondary | ICD-10-CM

## 2012-08-06 DIAGNOSIS — E119 Type 2 diabetes mellitus without complications: Secondary | ICD-10-CM

## 2012-08-06 DIAGNOSIS — E785 Hyperlipidemia, unspecified: Secondary | ICD-10-CM

## 2012-08-06 LAB — LIPID PANEL
Cholesterol: 132 mg/dL (ref 0–200)
Triglycerides: 56 mg/dL (ref <150)

## 2012-08-06 LAB — POCT GLYCOSYLATED HEMOGLOBIN (HGB A1C): Hemoglobin A1C: 6.1

## 2012-08-06 NOTE — Progress Notes (Signed)
CC: Ryan Bernard is a 79 y.o. male is here for Diabetes   Subjective: HPI:  Followup hypertension: Continues to take benazepril, hydrochlorothiazide , metoprolol, with no outside blood pressures to report. Still walking 3-5 miles a day without shortness of breath, denies new headaches, chest pain, shortness of breath, orthopnea, peripheral edema, nor motor or sensory disturbances   Followup type 2 diabetes: He has not been taking antihyperglycemic agents for over 6 months. Last A1c 6.2. He's quite involved with daily physical activity and try to watch what he eats. He denies polyuria polyphasia or polydipsia denies poorly healing wounds nor foot lesions or skin breakdown  Followup hyperlipidemia: At last visit LDL was above goal 70, increased pravastatin now 80 mg a day has been taking daily few days less than 3 months. Denies right upper quadrant pain, myalgias, skin or scleral discoloration. Denies lymph node occasional exertional chest pain   Review Of Systems Outlined In HPI  Past Medical History  Diagnosis Date  . CAD (coronary artery disease) 1995    w/ angioplasty  . Hypertension   . Hyperlipidemia   . Anemia   . BPH (benign prostatic hyperplasia)   . Heart disease     Mild valvular w/ pulm HTN  . SBE (subacute bacterial endocarditis) prophylaxis candidate   . Diabetes mellitus 2004    type 2     Family History  Problem Relation Age of Onset  . Heart disease Mother   . Hypertension Mother   . Alcohol abuse Father   . Depression Daughter   . Depression Son      History  Substance Use Topics  . Smoking status: Never Smoker   . Smokeless tobacco: Not on file  . Alcohol Use: No     Objective: Filed Vitals:   08/06/12 1009  BP: 132/75  Pulse: 59    General: Alert and Oriented, No Acute Distress HEENT: Pupils equal, round, reactive to light. Conjunctivae clear.  Moist mucous membranes Lungs: Clear to auscultation bilaterally, no wheezing/ronchi/rales.   Comfortable work of breathing. Good air movement. Cardiac: Regular rate and rhythm. Normal S1/S2.  No murmurs, rubs, nor gallops.   Abdomen: Normal bowel sounds, soft and non tender without palpable masses. Extremities: No peripheral edema.  Strong peripheral pulses.  Mental Status: No depression, anxiety, nor agitation. Skin: Warm and dry.  Assessment & Plan: Ryan Bernard was seen today for diabetes.  Diagnoses and associated orders for this visit:  DM - POCT HgB A1C  Hyperlipidemia LDL goal < 70 - Lipid panel  Essential hypertension, benign    Type 2 diabetes: Chronic well controlled condition with A1c of 6.1 today. Continue holding antihyperglycemic medications. Essential hypertension: Chronic well controlled condition continue current anti-hypertensive regimen Hyperlipidemia: Due for A1c for goal LDL less than 70  Return in about 3 months (around 11/06/2012).

## 2012-08-07 ENCOUNTER — Encounter: Payer: Self-pay | Admitting: Family Medicine

## 2012-10-02 ENCOUNTER — Ambulatory Visit (INDEPENDENT_AMBULATORY_CARE_PROVIDER_SITE_OTHER): Payer: Medicare Other | Admitting: Family Medicine

## 2012-10-02 ENCOUNTER — Encounter: Payer: Self-pay | Admitting: Family Medicine

## 2012-10-02 VITALS — BP 187/89 | HR 75 | Temp 98.1°F | Resp 18 | Wt 155.0 lb

## 2012-10-02 DIAGNOSIS — I10 Essential (primary) hypertension: Secondary | ICD-10-CM

## 2012-10-02 DIAGNOSIS — M6289 Other specified disorders of muscle: Secondary | ICD-10-CM

## 2012-10-02 DIAGNOSIS — M62838 Other muscle spasm: Secondary | ICD-10-CM

## 2012-10-02 DIAGNOSIS — R062 Wheezing: Secondary | ICD-10-CM

## 2012-10-02 MED ORDER — PREDNISONE 20 MG PO TABS: ORAL_TABLET | ORAL | Status: AC

## 2012-10-02 NOTE — Progress Notes (Signed)
CC: Ryan Bernard is a 79 y.o. male is here for Neck Pain and Wheezing   Subjective: HPI:  Patient complains of wheezing that has been present for 2 weeks on a daily basis it's mild severity during the day moderate severity at night. He has been awoken twice due to the wheezing. He has never had this before that he can recall. Coughing improved wheezing nothing else makes better or worse he denies shortness of breath continues to walk 3-4 miles most days of the week. Denies orthopnea, peripheral edema, blood and sputum, chest pain, fevers, chills  Patient complains of neck stiffness that has been present for 2 days it is localized on the sides of the neck is mild to moderate in severity. It is worse when looking to the left or right or with extension. He denies photophobia, rash, confusion, headache nor motor or sensory disturbances  Patient claims he continues to take metoprolol benazepril and terazosin on a daily basis without missed doses. Denies change in salt intake or exercise regimen   Review Of Systems Outlined In HPI  Past Medical History  Diagnosis Date  . CAD (coronary artery disease) 1995    w/ angioplasty  . Hypertension   . Hyperlipidemia   . Anemia   . BPH (benign prostatic hyperplasia)   . Heart disease     Mild valvular w/ pulm HTN  . SBE (subacute bacterial endocarditis) prophylaxis candidate   . Diabetes mellitus 2004    type 2     Family History  Problem Relation Age of Onset  . Heart disease Mother   . Hypertension Mother   . Alcohol abuse Father   . Depression Daughter   . Depression Son      History  Substance Use Topics  . Smoking status: Never Smoker   . Smokeless tobacco: Not on file  . Alcohol Use: No     Objective: Filed Vitals:   10/02/12 1051  BP: 187/89  Pulse: 75  Temp: 98.1 F (36.7 C)  Resp: 18    General: Alert and Oriented, No Acute Distress HEENT: Pupils equal, round, reactive to light. Conjunctivae clear.  External ears  unremarkable, canals clear with intact TMs with appropriate landmarks.  Middle ear appears open without effusion. Pink inferior turbinates.  Moist mucous membranes, pharynx without inflammation nor lesions.  Neck supple without palpable lymphadenopathy nor abnormal masses.  Lungs: Trace and asked for wheezing in all lung fields no rhonchi no rales nor signs of consolidation  Comfortable work of breathing. Good air movement. Cardiac: Regular rate and rhythm. Normal S1/S2.  No murmurs, rubs, nor gallops.   Back: No midline cervical spine tenderness to palpation pain is reproduced with palpation of bilateral upper trapezius muscles which are moderately hypertonic, patient refuses Spurling maneuver Extremities: No peripheral edema.  Strong peripheral pulses.  Mental Status: No depression, anxiety, nor agitation. Skin: Warm and dry.  Assessment & Plan: Ryan Bernard was seen today for neck pain and wheezing.  Diagnoses and associated orders for this visit:  Wheezing - predniSONE (DELTASONE) 20 MG tablet; Three tabs at once daily for five days.  Muscle hypertonicity  Essential hypertension, benign - metoprolol succinate (TOPROL-XL) 25 MG 24 hr tablet; Take 2 tablets (50 mg total) by mouth daily.    Essential hypertension: Uncontrolled chronic condition I've asked him to increase his metoprolol to 50 mg daily recheck next week Wheezing: Discussed with patient low suspicion of bacterial infection start prednisone.Signs and symptoms requring emergent/urgent reevaluation were discussed with the  patient. Muscle hypertonicity: Discussed range of motion exercises and stretching to perform daily, inflammation should be helped to a degree with prednisone above. Also consider hot pads  Return if symptoms worsen or fail to improve.

## 2012-10-15 ENCOUNTER — Encounter: Payer: Self-pay | Admitting: Family Medicine

## 2012-11-06 ENCOUNTER — Ambulatory Visit (INDEPENDENT_AMBULATORY_CARE_PROVIDER_SITE_OTHER): Payer: Medicare Other | Admitting: Family Medicine

## 2012-11-06 ENCOUNTER — Encounter: Payer: Self-pay | Admitting: Family Medicine

## 2012-11-06 VITALS — BP 129/71 | HR 62 | Wt 146.0 lb

## 2012-11-06 DIAGNOSIS — I10 Essential (primary) hypertension: Secondary | ICD-10-CM

## 2012-11-06 DIAGNOSIS — E119 Type 2 diabetes mellitus without complications: Secondary | ICD-10-CM

## 2012-11-06 DIAGNOSIS — M25579 Pain in unspecified ankle and joints of unspecified foot: Secondary | ICD-10-CM

## 2012-11-06 DIAGNOSIS — I2510 Atherosclerotic heart disease of native coronary artery without angina pectoris: Secondary | ICD-10-CM

## 2012-11-06 DIAGNOSIS — Z23 Encounter for immunization: Secondary | ICD-10-CM

## 2012-11-06 DIAGNOSIS — M25572 Pain in left ankle and joints of left foot: Secondary | ICD-10-CM

## 2012-11-06 MED ORDER — HYDROCHLOROTHIAZIDE 25 MG PO TABS: 25.0000 mg | ORAL_TABLET | Freq: Every day | ORAL | Status: DC
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
## 2013-02-08 ENCOUNTER — Encounter: Payer: Self-pay | Admitting: Family Medicine

## 2013-02-08 ENCOUNTER — Ambulatory Visit (INDEPENDENT_AMBULATORY_CARE_PROVIDER_SITE_OTHER): Payer: Medicare Other | Admitting: Family Medicine

## 2013-02-08 VITALS — BP 130/85 | HR 72 | Wt 151.0 lb

## 2013-02-08 DIAGNOSIS — I2510 Atherosclerotic heart disease of native coronary artery without angina pectoris: Secondary | ICD-10-CM

## 2013-02-08 DIAGNOSIS — I10 Essential (primary) hypertension: Secondary | ICD-10-CM

## 2013-02-08 DIAGNOSIS — E119 Type 2 diabetes mellitus without complications: Secondary | ICD-10-CM

## 2013-02-08 NOTE — Progress Notes (Signed)
CC: Ryan Bernard is a 79 y.o. male is here for Diabetes   Subjective: HPI:  Follow up type 2 diabetes: Has not been on any antihyperglycemic medication in over a year. Tries to watch the eats he exercises most days of the week walking at least half an hour. No outside blood sugars to report denies hypoglycemic episodes. Denies polyuria polyphagia polydipsia nor poorly healing wounds  Essential hypertension: Continues on benazepril and hydrochlorothiazide and doxazosin. Blood pressures at home are 99% of the time normotensive he has one blood pressure reading from last week with a systolic of 90 diastolic 50 while he was resting and reading denies any lightheadedness, motor or sensory disturbances or headache.  Followup coronary artery disease: He is taking a full aspirin daily along with pravastatin cholesterol was at goal last spring. Denies chest pain, limb claudication shortness of breath nor peripheral edema     Review Of Systems Outlined In HPI  Past Medical History  Diagnosis Date  . CAD (coronary artery disease) 1995    w/ angioplasty  . Hypertension   . Hyperlipidemia   . Anemia   . BPH (benign prostatic hyperplasia)   . Heart disease     Mild valvular w/ pulm HTN  . SBE (subacute bacterial endocarditis) prophylaxis candidate   . Diabetes mellitus 2004    type 2     Family History  Problem Relation Age of Onset  . Heart disease Mother   . Hypertension Mother   . Alcohol abuse Father   . Depression Daughter   . Depression Son      History  Substance Use Topics  . Smoking status: Never Smoker   . Smokeless tobacco: Not on file  . Alcohol Use: No     Objective: Filed Vitals:   02/08/13 0945  BP: 130/85  Pulse:     General: Alert and Oriented, No Acute Distress HEENT: Pupils equal, round, reactive to light. Conjunctivae clear.  Moist mucous membranes pharynx unremarkable Lungs: Clear to auscultation bilaterally, no wheezing/ronchi/rales.  Comfortable work  of breathing. Good air movement. Cardiac: Regular rate and rhythm. Normal S1/S2.  No murmurs, rubs, nor gallops.   Abdomen: Soft nontender Extremities: No peripheral edema.  Strong peripheral pulses.  Mental Status: No depression, anxiety, nor agitation. Skin: Warm and dry.  Assessment & Plan: Cai was seen today for diabetes.  Diagnoses and associated orders for this visit:  DM - POCT HgB A1C  Essential hypertension, benign  CAD    Type 2 diabetes: A1c 5.7 today well controlled continue exercise and dietary interventions we will now space out A1c frequency to every 6 months  essential hypertension: Controlled continue antihypertensive regimen Coronary artery disease: Controlled continue aspirin, statin, beta blocker  25 minutes spent face-to-face during visit today of which at least 50% was counseling or coordinating care regarding coronary artery disease, essential hypertension, diabetes mellitus.   Return in about 6 months (around 08/09/2013).

## 2013-06-10 ENCOUNTER — Ambulatory Visit (INDEPENDENT_AMBULATORY_CARE_PROVIDER_SITE_OTHER): Payer: Medicare Other | Admitting: Family Medicine

## 2013-06-10 ENCOUNTER — Encounter: Payer: Self-pay | Admitting: Family Medicine

## 2013-06-10 VITALS — BP 100/57 | HR 74 | Wt 146.0 lb

## 2013-06-10 DIAGNOSIS — I10 Essential (primary) hypertension: Secondary | ICD-10-CM

## 2013-06-10 DIAGNOSIS — R809 Proteinuria, unspecified: Secondary | ICD-10-CM

## 2013-06-10 DIAGNOSIS — I2510 Atherosclerotic heart disease of native coronary artery without angina pectoris: Secondary | ICD-10-CM

## 2013-06-10 DIAGNOSIS — E785 Hyperlipidemia, unspecified: Secondary | ICD-10-CM

## 2013-06-10 DIAGNOSIS — B0229 Other postherpetic nervous system involvement: Secondary | ICD-10-CM

## 2013-06-10 DIAGNOSIS — E119 Type 2 diabetes mellitus without complications: Secondary | ICD-10-CM

## 2013-06-10 LAB — POCT UA - MICROALBUMIN
Creatinine, POC: 300 mg/dL
Microalbumin Ur, POC: 30 mg/L

## 2013-06-10 LAB — LIPID PANEL
Cholesterol: 133 mg/dL (ref 0–200)
HDL: 57 mg/dL (ref >39)
LDL CALC: 63 mg/dL (ref 0–99)
Total CHOL/HDL Ratio: 2.3 Ratio
Triglycerides: 65 mg/dL (ref <150)
VLDL: 13 mg/dL (ref 0–40)

## 2013-06-10 LAB — BASIC METABOLIC PANEL WITH GFR
BUN: 15 mg/dL (ref 6–23)
CO2: 29 mEq/L (ref 19–32)
Calcium: 9.2 mg/dL (ref 8.4–10.5)
Chloride: 92 mEq/L — ABNORMAL LOW (ref 96–112)
Creat: 1.32 mg/dL (ref 0.50–1.35)
GFR, EST NON AFRICAN AMERICAN: 51 mL/min — AB
GFR, Est African American: 59 mL/min — ABNORMAL LOW
GLUCOSE: 99 mg/dL (ref 70–99)
POTASSIUM: 3.7 meq/L (ref 3.5–5.3)
SODIUM: 132 meq/L — AB (ref 135–145)

## 2013-06-10 LAB — POCT GLYCOSYLATED HEMOGLOBIN (HGB A1C): HEMOGLOBIN A1C: 6.1

## 2013-06-10 MED ORDER — CAPSAICIN 0.1 % EX CREA: TOPICAL_CREAM | CUTANEOUS | Status: DC

## 2013-06-10 NOTE — Progress Notes (Signed)
CC: Ryan Bernard is a 80 y.o. male is here for Diabetes   Subjective: HPI:  Followup type 2 diabetes: The outside blood sugars to report. No antihyperglycemic medications currently being taken. He exercises on a daily basis with a walking regimen, walking up to 6 miles a day. Denies polyuria polyphasia or polydipsia.  Coronary artery disease: He takes a statin and a aspirin on a daily basis. Denies exertional chest pain or limb claudication  Essential hypertension: No outside blood pressures to report continues on hydrochlorothiazide and benazepril. Denies hypo-tensive episodes, chest pain, shortness of breath, orthopnea, peripheral edema nor no side effects from medications.  Since I saw him last he developed shingles on the right torso/flank [facility] was too late for him to start on acyclovir. States this occurred about 2 months ago he has been left with mild to moderate pain described as a spinning sensation extending from the right back around the flank to the right chest worse with touch nothing else makes better or worse no interventions as of yet. States it's quite improved with time alone.   Review Of Systems Outlined In HPI  Past Medical History  Diagnosis Date  . CAD (coronary artery disease) 1995    w/ angioplasty  . Hypertension   . Hyperlipidemia   . Anemia   . BPH (benign prostatic hyperplasia)   . Heart disease     Mild valvular w/ pulm HTN  . SBE (subacute bacterial endocarditis) prophylaxis candidate   . Diabetes mellitus 2004    type 2    Past Surgical History  Procedure Laterality Date  . Angioplasty  1995    [facility]  . Hernia repair      x 2   . Back surgery  1964  . Coronary artery bypass graft  10/27/2009    5 vessel, Dr. Raoul PitchJoel Bernard   Family History  Problem Relation Age of Onset  . Heart disease Mother   . Hypertension Mother   . Alcohol abuse Father   . Depression Daughter   . Depression Son     History    Social History  . Marital Status: Married    Spouse Name: N/A    Number of Children: N/A  . Years of Education: N/A   Occupational History  . Not on file.   Social History Main Topics  . Smoking status: Never Smoker   . Smokeless tobacco: Not on file  . Alcohol Use: No  . Drug Use: No  . Sexual Activity:    Other Topics Concern  . Not on file   Social History Narrative  . No narrative on file     Objective: BP 100/57  Pulse 74  Wt 146 lb (66.225 kg)  General: Alert and Oriented, No Acute Distress HEENT: Pupils equal, round, reactive to light. Conjunctivae clear.  Moist mucous membranes pharynx unremarkable Lungs: Clear to auscultation bilaterally, no wheezing/ronchi/rales.  Comfortable work of breathing. Good air movement. Cardiac: Regular rate and rhythm. Normal S1/S2.  No murmurs, rubs, nor gallops.  Feet: Dorsalis pedis pulses 1+ bilaterally.  Monofilament sensation intact on plantar and dorsal surface bilaterally.  Vibratory sensation preserved at the medial 1st MTP joint bilaterally.  No signs of infection, skin breakdown, nor ulceration. Extremities: No peripheral edema.  Strong peripheral pulses.  Mental Status: No depression, anxiety, nor agitation. Skin: Warm and dry. Mild scarring from shingles on the right back extending around the flank slightly tender to touch  Assessment &  Plan: Ryan Bernard was seen today for diabetes.  Diagnoses and associated orders for this visit:  DM - POCT HgB A1C - POCT UA - Microalbumin - BASIC METABOLIC PANEL WITH GFR  CAD  Essential hypertension, benign - BASIC METABOLIC PANEL WITH GFR  Hyperlipidemia LDL goal < 70 - Lipid panel  MICROALBUMINURIA  Postherpetic neuralgia  Other Orders - Capsaicin 0.1 % CREA; Apply to shingles rash twice a day as needed for pain.    Type 2 diabetes: A1c is under control with diet and exercise alone, micro-albumen creatinine ratio control and benazepril Coronary artery disease: Stable  continue aspirin and statin Essential hypertension: Controlled continue current antihypertensive regimen Hyperlipidemia: Due for lipid panel Postherpetic neuralgia: He is not interested in any aggressive approach with oral medications therefore starting topical capsaicin  Return in about 3 months (around 09/09/2013) for diabetic and htn f/u.

## 2013-09-13 ENCOUNTER — Encounter: Payer: Self-pay | Admitting: Family Medicine

## 2013-09-13 ENCOUNTER — Ambulatory Visit (INDEPENDENT_AMBULATORY_CARE_PROVIDER_SITE_OTHER): Payer: Medicare Other | Admitting: Family Medicine

## 2013-09-13 VITALS — BP 108/56 | HR 89 | Ht 68.0 in | Wt 146.0 lb

## 2013-09-13 DIAGNOSIS — E119 Type 2 diabetes mellitus without complications: Secondary | ICD-10-CM

## 2013-09-13 DIAGNOSIS — I10 Essential (primary) hypertension: Secondary | ICD-10-CM

## 2013-09-13 DIAGNOSIS — B0229 Other postherpetic nervous system involvement: Secondary | ICD-10-CM

## 2013-09-13 LAB — POCT GLYCOSYLATED HEMOGLOBIN (HGB A1C): Hemoglobin A1C: 5.8

## 2013-09-13 NOTE — Patient Instructions (Signed)
Stop taking Hydrochlorthiazide (Blood Pressure Pill) until I see you next.

## 2013-09-13 NOTE — Progress Notes (Signed)
CC: Ryan Bernard is a 80 y.o. male is here for Diabetes   Subjective: HPI:  Followup type 2 diabetes: He continues to not take any antihyperglycemic medications. He is walking 3-6 miles every day. He focuses on portion control and a balanced diet low in carbohydrates. Denies polyuria polyphagia polydipsia nor poorly healing wounds.  Followup hypertension: Continues on metoprolol, terazosin, benazepril, hydrochlorothiazide. Outside blood pressures have been noted to be systolic 100-120 but mostly in the low 100s, diastolic in the low 60s.  Denies orthostatic like symptoms. Denies chest pain shortness of breath orthopnea nor peripheral edema  Follow postherpetic neuralgia: States that the burning from capsasin is actually worse than the tingling that he continues to experience on the right flank that is present on a daily basis and worse with light touch. States the symptoms are not interfering with quality of life currently and denies any skin changes.  Review Of Systems Outlined In HPI  Past Medical History  Diagnosis Date  . CAD (coronary artery disease) 1995    w/ angioplasty  . Hypertension   . Hyperlipidemia   . Anemia   . BPH (benign prostatic hyperplasia)   . Heart disease     Mild valvular w/ pulm HTN  . SBE (subacute bacterial endocarditis) prophylaxis candidate   . Diabetes mellitus 2004    type 2    Past Surgical History  Procedure Laterality Date  . Angioplasty  1995    [facility]  . Hernia repair      x 2   . Back surgery  1964  . Coronary artery bypass graft  10/27/2009    5 vessel, Dr. Raoul PitchJoel Morgan   Family History  Problem Relation Age of Onset  . Heart disease Mother   . Hypertension Mother   . Alcohol abuse Father   . Depression Daughter   . Depression Son     History   Social History  . Marital Status: Married    Spouse Name: N/A    Number of Children: N/A  . Years of Education: N/A   Occupational History  . Not on file.   Social History  Main Topics  . Smoking status: Never Smoker   . Smokeless tobacco: Not on file  . Alcohol Use: No  . Drug Use: No  . Sexual Activity:    Other Topics Concern  . Not on file   Social History Narrative  . No narrative on file     Objective: BP 108/56  Pulse 89  Ht 5\' 8"  (1.727 m)  Wt 146 lb (66.225 kg)  BMI 22.20 kg/m2  General: Alert and Oriented, No Acute Distress HEENT: Pupils equal, round, reactive to light. Conjunctivae clear.  Moist mucous membranes pharynx unremarkable Lungs: Clear to auscultation bilaterally, no wheezing/ronchi/rales.  Comfortable work of breathing. Good air movement. Cardiac: Regular rate and rhythm. Normal S1/S2.  No murmurs, rubs, nor gallops.   Extremities: No peripheral edema.  Strong peripheral pulses.  Mental Status: No depression, anxiety, nor agitation. Skin: Warm and dry. No overlying skin changes on the right flank where he localizes his pain  Assessment & Plan: Ryan Bernard was seen today for diabetes.  Diagnoses and associated orders for this visit:  DM - POCT HgB A1C  Essential hypertension, benign  Postherpetic neuralgia    Type 2 diabetes: A1c 5.8: Controlled, continue to manage with diet and exercise alone Essential hypertension: Controlled, given frequent low blood pressures I've advised him to stop hydrochlorothiazide for the next 3 months to  see if he still actually needs this medication and to minimize fall risk Postherpetic neuralgia: Controlled chronic condition, understandably he still politely declines any oral medications to decrease any lingering discomfort  Return in about 3 months (around 12/14/2013) for Diabetes and HTN Follow Up.

## 2013-10-11 ENCOUNTER — Encounter: Payer: Self-pay | Admitting: Family Medicine

## 2013-10-11 ENCOUNTER — Ambulatory Visit (INDEPENDENT_AMBULATORY_CARE_PROVIDER_SITE_OTHER): Payer: Medicare Other | Admitting: Family Medicine

## 2013-10-11 VITALS — BP 168/81 | HR 63 | Temp 98.3°F | Wt 153.0 lb

## 2013-10-11 DIAGNOSIS — R059 Cough, unspecified: Secondary | ICD-10-CM

## 2013-10-11 DIAGNOSIS — R05 Cough: Secondary | ICD-10-CM

## 2013-10-11 MED ORDER — PREDNISONE 20 MG PO TABS: ORAL_TABLET | ORAL | Status: AC

## 2013-10-11 NOTE — Progress Notes (Signed)
CC: Ryan Bernard is a 80 y.o. male is here for URI   Subjective: HPI:  Complains of cough that has been present for the last week on a daily basis worsening a daily basis. Nonproductive but improves with Mucinex. Worse when lying down at night. Nothing else seems to make better or worse other than that described above. Denies fevers, chills, nausea, nasal congestion, wheezing, blood and sputum, nor chest pain. Shortness of breath with activity has been present described as mild in severity.   Review Of Systems Outlined In HPI  Past Medical History  Diagnosis Date  . CAD (coronary artery disease) 1995    w/ angioplasty  . Hypertension   . Hyperlipidemia   . Anemia   . BPH (benign prostatic hyperplasia)   . Heart disease     Mild valvular w/ pulm HTN  . SBE (subacute bacterial endocarditis) prophylaxis candidate   . Diabetes mellitus 2004    type 2    Past Surgical History  Procedure Laterality Date  . Angioplasty  1995    [facility]  . Hernia repair      x 2   . Back surgery  1964  . Coronary artery bypass graft  10/27/2009    5 vessel, Ryan Bernard   Family History  Problem Relation Age of Onset  . Heart disease Mother   . Hypertension Mother   . Alcohol abuse Father   . Depression Daughter   . Depression Son     History   Social History  . Marital Status: Married    Spouse Name: N/A    Number of Children: N/A  . Years of Education: N/A   Occupational History  . Not on file.   Social History Main Topics  . Smoking status: Never Smoker   . Smokeless tobacco: Not on file  . Alcohol Use: No  . Drug Use: No  . Sexual Activity:    Other Topics Concern  . Not on file   Social History Narrative  . No narrative on file     Objective: BP 168/81  Pulse 63  Temp(Src) 98.3 F (36.8 C) (Oral)  Wt 153 lb (69.4 kg)  General: Alert and Oriented, No Acute Distress HEENT: Pupils equal, round, reactive to light. Conjunctivae clear.  External ears  unremarkable, canals clear with intact TMs with appropriate landmarks.  Middle ear appears open without effusion. Pink inferior turbinates.  Moist mucous membranes, pharynx without inflammation nor lesions.  Neck supple without palpable lymphadenopathy nor abnormal masses. Lungs: Clear to auscultation bilaterally, no wheezing/ronchi/rales.  Comfortable work of breathing. Good air movement. Cardiac: Regular rate and rhythm. Normal S1/S2.  Grade II/VI holosystolic murmur at L 2nd IC space, rubs, nor gallops.   Mental Status: No depression, anxiety, nor agitation. Skin: Warm and dry.  Assessment & Plan: Ryan Bernard was seen today for uri.  Diagnoses and associated orders for this visit:  Cough - predniSONE (DELTASONE) 20 MG tablet; Two tabs at once daily for five days.    Cough: No evidence of bacterial infection therefore moderate dose of prednisone in hopes of helping speed up his cough resolution. Continue Mucinex  Return if symptoms worsen or fail to improve.

## 2013-12-14 ENCOUNTER — Ambulatory Visit (INDEPENDENT_AMBULATORY_CARE_PROVIDER_SITE_OTHER): Payer: Medicare Other | Admitting: Family Medicine

## 2013-12-14 ENCOUNTER — Encounter: Payer: Self-pay | Admitting: Family Medicine

## 2013-12-14 VITALS — BP 98/60 | HR 70 | Ht 68.0 in | Wt 142.0 lb

## 2013-12-14 DIAGNOSIS — E1122 Type 2 diabetes mellitus with diabetic chronic kidney disease: Secondary | ICD-10-CM

## 2013-12-14 DIAGNOSIS — N189 Chronic kidney disease, unspecified: Secondary | ICD-10-CM

## 2013-12-14 DIAGNOSIS — I10 Essential (primary) hypertension: Secondary | ICD-10-CM

## 2013-12-14 DIAGNOSIS — M542 Cervicalgia: Secondary | ICD-10-CM

## 2013-12-14 DIAGNOSIS — Z23 Encounter for immunization: Secondary | ICD-10-CM

## 2013-12-14 LAB — POCT GLYCOSYLATED HEMOGLOBIN (HGB A1C): HEMOGLOBIN A1C: 5.8

## 2013-12-14 MED ORDER — PREDNISONE 20 MG PO TABS: ORAL_TABLET | ORAL | Status: AC

## 2013-12-14 NOTE — Progress Notes (Signed)
CC: Ryan Bernard is a 80 y.o. male is here for Diabetes   Subjective: HPI:  Followup essential hypertension: Continues to take benazepril, metoprolol, terazosin and since his last visit stopped taking hydrochlorothiazide. No outside blood pressures to report. He states he feels great, continues to walk to 3 miles a day, denies chest pain shortness of breath orthopnea nor peripheral edema  Followup type 2 diabetes: Continues to focus on diet and exercise alone and currently not taking any anti-hyperglycemics.  Denies polyuria polyphasia polydipsia and has an eye exam upcoming in November  He complains of posterior neck pain that began in the middle of last week that was described as in the midline nonradiating and worse with extension of the head. He was moderate to severe in severity and less he was lying down flat. It was nonradiating and would also worsen if you look to the left of the right. No interventions other than Tylenol with only mild improvement of pain. Symptoms are currently mild in severity and described only has pain. Denies any motor sensory disturbances in the upper extremities  Review Of Systems Outlined In HPI  Past Medical History  Diagnosis Date  . CAD (coronary artery disease) 1995    w/ angioplasty  . Hypertension   . Hyperlipidemia   . Anemia   . BPH (benign prostatic hyperplasia)   . Heart disease     Mild valvular w/ pulm HTN  . SBE (subacute bacterial endocarditis) prophylaxis candidate   . Diabetes mellitus 2004    type 2    Past Surgical History  Procedure Laterality Date  . Angioplasty  1995    [facility]  . Hernia repair      x 2   . Back surgery  1964  . Coronary artery bypass graft  10/27/2009    5 vessel, Dr. Raoul Pitch   Family History  Problem Relation Age of Onset  . Heart disease Mother   . Hypertension Mother   . Alcohol abuse Father   . Depression Daughter   . Depression Son     History   Social History  . Marital Status:  Married    Spouse Name: N/A    Number of Children: N/A  . Years of Education: N/A   Occupational History  . Not on file.   Social History Main Topics  . Smoking status: Never Smoker   . Smokeless tobacco: Not on file  . Alcohol Use: No  . Drug Use: No  . Sexual Activity:    Other Topics Concern  . Not on file   Social History Narrative  . No narrative on file     Objective: BP 98/60  Pulse 70  Ht 5\' 8"  (1.727 m)  Wt 142 lb (64.411 kg)  BMI 21.60 kg/m2  General: Alert and Oriented, No Acute Distress HEENT: Pupils equal, round, reactive to light. Conjunctivae clear.  External ears unremarkable, canals clear with intact TMs with appropriate landmarks.  Middle ear appears open without effusion. Pink inferior turbinates.  Moist mucous membranes, pharynx without inflammation nor lesions.  Neck supple without palpable lymphadenopathy nor abnormal masses. Lungs: Clear to auscultation bilaterally, no wheezing/ronchi/rales.  Comfortable work of breathing. Good air movement. Cardiac: Regular rate and rhythm. Normal S1/S2.  No murmurs, rubs, nor gallops.   Back: No midline spinous process tenderness in the cervical region, full range of motion and strength of the cervical spine. Spurling's is negative bilaterally. No paraspinal hypertonicity Extremities: No peripheral edema.  Strong peripheral pulses.  Mental Status: No depression, anxiety, nor agitation. Skin: Warm and dry.  Assessment & Plan: Ryan Bernard was seen today for diabetes.  Diagnoses and associated orders for this visit:  Essential hypertension, benign  Pain of cervical facet joint - predniSONE (DELTASONE) 20 MG tablet; Three tabs at once daily for five days.  Type 2 diabetes mellitus with diabetic chronic kidney disease - POCT HgB A1C  Encounter for immunization    Essential hypertension: Controlled continue to hold hydrochlorothiazide. Continue with benazepril, metoprolol, terazosin and discussed signs of orthostatic  hypotension that would require us to cut back on his benazepril Type 2 diabetes: A1c 5.8 today, control, continue successful diet and exercise interventions Neck pain is most likely due to facet joint arthritis, I've encouraged him to avoid nonsteroidal anti-inflammatories due to renal insufficiency and to try burst of prednisone to see if this completely alleviates his pain  He received a flu shot today   Return in about 3 months (around 03/16/2014) for HTN, Diabetes.

## 2014-01-07 ENCOUNTER — Emergency Department
Admission: EM | Admit: 2014-01-07 | Discharge: 2014-01-07 | Disposition: A | Discharge status: Home / Self Care | Payer: Medicare Other | Source: Home / Self Care

## 2014-01-07 ENCOUNTER — Encounter: Payer: Self-pay | Admitting: Emergency Medicine

## 2014-01-07 DIAGNOSIS — J209 Acute bronchitis, unspecified: Secondary | ICD-10-CM

## 2014-01-07 DIAGNOSIS — J042 Acute laryngotracheitis: Secondary | ICD-10-CM

## 2014-01-07 MED ORDER — AZITHROMYCIN 250 MG PO TABS: ORAL_TABLET | ORAL | Status: DC

## 2014-01-07 NOTE — ED Provider Notes (Signed)
CSN: [id number]     Arrival date & time 01/07/14  1022 History   None    Chief Complaint  Patient presents with  . Hoarse  . Cough  . Nasal Congestion   (Consider location/radiation/quality/duration/timing/severity/associated sxs/prior Treatment) HPI  URI HISTORY  Ryan Bernard is a 80 y.o. male who complains of onset of cold symptoms for several days.  Have been using over-the-counter treatment which helps a little bit.  No chills/sweats +  Fever, improved today  +  Nasal congestion, progressively worsening. Worsening hoarseness +  Discolored Post-nasal drainage No sinus pain/pressure No sore throat. No dysphagia, denies reflux symptoms  +  Cough, dry nonproductive No wheezing Positive chest congestion No hemoptysis No shortness of breath No pleuritic pain  No itchy/red eyes No earache  No nausea No vomiting No abdominal pain No diarrhea  No skin rashes +  Fatigue No myalgias No headache   Past Medical History  Diagnosis Date  . CAD (coronary artery disease) 1995    w/ angioplasty  . Hypertension   . Hyperlipidemia   . Anemia   . BPH (benign prostatic hyperplasia)   . Heart disease     Mild valvular w/ pulm HTN  . SBE (subacute bacterial endocarditis) prophylaxis candidate   . Diabetes mellitus 2004    type 2   Past Surgical History  Procedure Laterality Date  . Angioplasty  1995    [facility]  . Hernia repair      x 2   . Back surgery  1964  . Coronary artery bypass graft  10/27/2009    5 vessel, Dr. Raoul PitchJoel Morgan   Family History  Problem Relation Age of Onset  . Heart disease Mother   . Hypertension Mother   . Alcohol abuse Father   . Depression Daughter   . Depression Son    History  Substance Use Topics  . Smoking status: Never Smoker   . Smokeless tobacco: Not on file  . Alcohol Use: No    Review of Systems  All other systems reviewed and are negative.   Allergies  Review of patient's allergies indicates no known allergies.  Home  Medications   Prior to Admission medications   Medication Sig Start Date End Date Taking? Authorizing Provider  aspirin 325 MG tablet Take 325 mg by mouth daily.      Historical Provider, MD  azithromycin (ZITHROMAX Z-PAK) 250 MG tablet Take 2 tablets on day one, then 1 tablet daily on days 2 through 5 01/07/14   Lajean Manesavid Massey, MD  benazepril (LOTENSIN) 40 MG tablet Take 40 mg by mouth daily.      Historical Provider, MD  fluocinonide cream (LIDEX) 0.05 % Apply topically 2 (two) times daily as needed. 12/24/11   Hassan RowanEugene Wade, MD  metoprolol succinate (TOPROL-XL) 25 MG 24 hr tablet Take 2 tablets (50 mg total) by mouth daily. 10/02/12   Sean Hommel, DO  pravastatin (PRAVACHOL) 80 MG tablet Take 1 tablet (80 mg total) by mouth at bedtime. 05/06/12   Laren BoomSean Hommel, DO  terazosin (HYTRIN) 10 MG capsule Take 10 mg by mouth daily.      Historical Provider, MD   BP 167/74  Pulse 74  Temp(Src) 97.7 F (36.5 C) (Oral)  Resp 16  Ht 5\' 8"  (1.727 m)  Wt 143 lb (64.864 kg)  BMI 21.75 kg/m2  SpO2 95% Physical Exam  Nursing note and vitals reviewed. Constitutional: He is oriented to person, place, and time. He appears well-developed and well-nourished. No  distress.  HENT:  Head: Normocephalic and atraumatic.  Right Ear: Tympanic membrane normal.  Left Ear: Tympanic membrane normal.  Nose: Nose normal.  Mouth/Throat: Oropharynx is clear and moist. No oropharyngeal exudate.  He is very hoarse. Posterior pharynx mildly red. +Sero-mucoid drainage. Airway intact.  Eyes: Conjunctivae are normal. Right eye exhibits no discharge. Left eye exhibits no discharge. No scleral icterus.  Neck: Neck supple. No JVD present. No tracheal deviation present.  Cardiovascular: Normal rate, regular rhythm and normal heart sounds.   Pulmonary/Chest: No stridor. No respiratory distress. He has no wheezes. He has rhonchi. He has no rales.  Lymphadenopathy:    He has no cervical adenopathy.  Neurological: He is alert and  oriented to person, place, and time.  Skin: Skin is warm and dry.    ED Course  Procedures (including critical care time) Labs Review Labs Reviewed - No data to display  Imaging Review No results found.   MDM   1. Acute bronchitis, unspecified organism   2. Acute laryngitis and tracheitis    Treatment options discussed, as well as risks, benefits, alternatives. Patient voiced understanding and agreement with the following plans:  Z-Pak Other symptomatic care He declined any prescription cough medicine Follow-up with your primary care doctor in 5-7 days if not improving, or sooner if symptoms become worse. Precautions discussed. Red flags discussed. Questions invited and answered. Patient voiced understanding and agreement.   Lajean Manesavid Massey, MD 01/07/14 1500

## 2014-01-07 NOTE — ED Notes (Signed)
Reports 2 days of congestion, cough and hoarseness; actually better today than yesterday. Denies fever. Did have Flu vaccination this season.

## 2014-03-16 ENCOUNTER — Encounter: Payer: Self-pay | Admitting: Family Medicine

## 2014-03-16 ENCOUNTER — Ambulatory Visit (INDEPENDENT_AMBULATORY_CARE_PROVIDER_SITE_OTHER): Payer: Medicare Other | Admitting: Family Medicine

## 2014-03-16 VITALS — BP 131/72 | HR 62 | Wt 147.0 lb

## 2014-03-16 DIAGNOSIS — E1122 Type 2 diabetes mellitus with diabetic chronic kidney disease: Secondary | ICD-10-CM

## 2014-03-16 DIAGNOSIS — Z23 Encounter for immunization: Secondary | ICD-10-CM

## 2014-03-16 DIAGNOSIS — I10 Essential (primary) hypertension: Secondary | ICD-10-CM

## 2014-03-16 DIAGNOSIS — N189 Chronic kidney disease, unspecified: Secondary | ICD-10-CM

## 2014-03-16 LAB — POCT GLYCOSYLATED HEMOGLOBIN (HGB A1C): Hemoglobin A1C: 5.7

## 2014-03-16 NOTE — Progress Notes (Signed)
CC: Ryan Bernard is a 81 y.o. male is here for Diabetes   Subjective: HPI:  Follow-up type 2 diabetes: He continues to remain off of all antihypertensive glycemic medications. He is no longer walking on a daily basis due to poor weather however has been using a stationary bicycle at home frequently throughout the week. Denies polyuria polyphagia polydipsia nor poorly healing wounds. Denies any vision loss.  Follow-up hypertension: Continues on metoprolol, benazepril, terazosin.  No outside blood pressures reported. Denies chest pain shortness of breath orthopnea nor motor or sensory disturbances.   Review Of Systems Outlined In HPI  Past Medical History  Diagnosis Date  . CAD (coronary artery disease) 1995    w/ angioplasty  . Hypertension   . Hyperlipidemia   . Anemia   . BPH (benign prostatic hyperplasia)   . Heart disease     Mild valvular w/ pulm HTN  . SBE (subacute bacterial endocarditis) prophylaxis candidate   . Diabetes mellitus 2004    type 2    Past Surgical History  Procedure Laterality Date  . Angioplasty  1995    [facility]  . Hernia repair      x 2   . Back surgery  1964  . Coronary artery bypass graft  10/27/2009    5 vessel, Dr. Raoul PitchJoel Morgan   Family History  Problem Relation Age of Onset  . Heart disease Mother   . Hypertension Mother   . Alcohol abuse Father   . Depression Daughter   . Depression Son     History   Social History  . Marital Status: Married    Spouse Name: N/A    Number of Children: N/A  . Years of Education: N/A   Occupational History  . Not on file.   Social History Main Topics  . Smoking status: Never Smoker   . Smokeless tobacco: Not on file  . Alcohol Use: No  . Drug Use: No  . Sexual Activity: Not on file   Other Topics Concern  . Not on file   Social History Narrative     Objective: BP 131/72 mmHg  Pulse 62  Wt 147 lb (66.679 kg)  General: Alert and Oriented, No Acute Distress HEENT: Pupils equal,  round, reactive to light. Conjunctivae clear.  Moist mucous membranes Lungs: Clear to auscultation bilaterally, no wheezing/ronchi/rales.  Comfortable work of breathing. Good air movement. Cardiac: Regular rate and rhythm. Normal S1/S2.  No murmurs, rubs, nor gallops.   Extremities: No peripheral edema.  Strong peripheral pulses.  Mental Status: No depression, anxiety, nor agitation. Skin: Warm and dry.  Assessment & Plan: Ryan Bernard was seen today for diabetes.  Diagnoses and associated orders for this visit:  Type 2 diabetes mellitus with diabetic chronic kidney disease - POCT HgB A1C  Essential hypertension, benign  Immunization due - Pneumococcal conjugate vaccine 13-valent    Type 2 diabetes: A1c of 5.7, controlled continue diet and exercise interventions with no need for anti-hyperglycemics at this time Essential hypertension: Controlled continue metoprolol, benazepril,terazosin  He has not had Prevnar since turning 67 however he will receive that today.   Return in about 3 months (around 06/15/2014) for Sugar and BP checks.

## 2014-06-15 ENCOUNTER — Ambulatory Visit: Payer: Medicare Other | Admitting: Family Medicine

## 2014-06-16 ENCOUNTER — Ambulatory Visit (INDEPENDENT_AMBULATORY_CARE_PROVIDER_SITE_OTHER): Payer: Medicare Other | Admitting: Family Medicine

## 2014-06-16 ENCOUNTER — Encounter: Payer: Self-pay | Admitting: Family Medicine

## 2014-06-16 VITALS — BP 135/80 | HR 60 | Wt 149.0 lb

## 2014-06-16 DIAGNOSIS — E1122 Type 2 diabetes mellitus with diabetic chronic kidney disease: Secondary | ICD-10-CM | POA: Diagnosis not present

## 2014-06-16 DIAGNOSIS — I10 Essential (primary) hypertension: Secondary | ICD-10-CM

## 2014-06-16 DIAGNOSIS — N189 Chronic kidney disease, unspecified: Secondary | ICD-10-CM

## 2014-06-16 DIAGNOSIS — N183 Chronic kidney disease, stage 3 unspecified: Secondary | ICD-10-CM

## 2014-06-16 DIAGNOSIS — Z951 Presence of aortocoronary bypass graft: Secondary | ICD-10-CM | POA: Diagnosis not present

## 2014-06-16 LAB — BASIC METABOLIC PANEL WITH GFR
BUN: 16 mg/dL (ref 6–23)
CO2: 30 meq/L (ref 19–32)
Calcium: 9 mg/dL (ref 8.4–10.5)
Chloride: 101 mEq/L (ref 96–112)
Creat: 1.08 mg/dL (ref 0.50–1.35)
GFR, EST NON AFRICAN AMERICAN: 64 mL/min
GFR, Est African American: 75 mL/min
GLUCOSE: 106 mg/dL — AB (ref 70–99)
POTASSIUM: 4.2 meq/L (ref 3.5–5.3)
Sodium: 141 mEq/L (ref 135–145)

## 2014-06-16 LAB — HEMOGLOBIN A1C
Hgb A1c MFr Bld: 5.9 % — ABNORMAL HIGH (ref <5.7)
Mean Plasma Glucose: 123 mg/dL — ABNORMAL HIGH (ref <117)

## 2014-06-16 LAB — LIPID PANEL
Cholesterol: 150 mg/dL (ref 0–200)
HDL: 56 mg/dL (ref >=40)
LDL CALC: 83 mg/dL (ref 0–99)
Total CHOL/HDL Ratio: 2.7 Ratio
Triglycerides: 56 mg/dL (ref <150)
VLDL: 11 mg/dL (ref 0–40)

## 2014-06-16 NOTE — Progress Notes (Signed)
CC: Ryan Bernard is a 81 y.o. male is here for Hypertension and Diabetes   Subjective: HPI:  Follow-up essential hypertension: No outside blood pressures report continues on benazepril metoprolol and terazosin  Follow-up chronic kidney disease: Continues to take benazepril. Denies any change in urine appearance or color. Denies any peripheral edema or edema elsewhere. No orthopnea.  Follow-up type 2 diabetes: No outside blood sugars to report. He admits that he is not been exercising nearly as much as he did over the summer. He's been unable to get into the habit of walking or indoor bicycling mostly due to the weather. No polyuria polyphagia polydipsia nor vision loss  Follow-up CABG: Continues to take pravastatin and full dose aspirin without any new shortness of breath or chest pain   Review Of Systems Outlined In HPI  Past Medical History  Diagnosis Date  . CAD (coronary artery disease) 1995    w/ angioplasty  . Hypertension   . Hyperlipidemia   . Anemia   . BPH (benign prostatic hyperplasia)   . Heart disease     Mild valvular w/ pulm HTN  . SBE (subacute bacterial endocarditis) prophylaxis candidate   . Diabetes mellitus 2004    type 2    Past Surgical History  Procedure Laterality Date  . Angioplasty  1995    [facility]  . Hernia repair      x 2   . Back surgery  1964  . Coronary artery bypass graft  10/27/2009    5 vessel, Dr. Raoul PitchJoel Bernard   Family History  Problem Relation Age of Onset  . Heart disease Mother   . Hypertension Mother   . Alcohol abuse Father   . Depression Daughter   . Depression Son     History   Social History  . Marital Status: Married    Spouse Name: N/A  . Number of Children: N/A  . Years of Education: N/A   Occupational History  . Not on file.   Social History Main Topics  . Smoking status: Never Smoker   . Smokeless tobacco: Not on file  . Alcohol Use: No  . Drug Use: No  . Sexual Activity: Not on file   Other  Topics Concern  . Not on file   Social History Narrative     Objective: BP 152/80 mmHg  Pulse 60  Wt 149 lb (67.586 kg)  General: Alert and Oriented, No Acute Distress HEENT: Pupils equal, round, reactive to light. Conjunctivae clear.  Moist mucous membranes Lungs: Clear to auscultation bilaterally, no wheezing/ronchi/rales.  Comfortable work of breathing. Good air movement. Cardiac: Regular rate and rhythm. Normal S1/S2.  No murmurs, rubs, nor gallops.   Extremities: No peripheral edema.  Strong peripheral pulses.  Mental Status: No depression, anxiety, nor agitation. Skin: Warm and dry.  Assessment & Plan: Ryan Bernard was seen today for hypertension and diabetes.  Diagnoses and all orders for this visit:  Essential hypertension, benign  S/P CABG x 5  Chronic kidney disease, stage 3  Type 2 diabetes mellitus with diabetic chronic kidney disease  Other orders -     BASIC METABOLIC PANEL WITH GFR -     Hemoglobin A1c -     Microalbumin / creatinine urine ratio   Essential hypertension: Controlled continue metoprolol and benazepril terazosin Type 2 diabetes: Clinically controlled due for A1c today Coronary artery disease: Stable continue aspirin and beta blocker Chronic kidney disease: clinical stable,Due for annual renal function.   No Follow-up on file.

## 2014-06-17 ENCOUNTER — Telehealth: Payer: Self-pay | Admitting: Family Medicine

## 2014-06-17 LAB — MICROALBUMIN / CREATININE URINE RATIO
CREATININE, URINE: 115.3 mg/dL
MICROALB UR: 6.2 mg/dL — AB (ref <2.0)
MICROALB/CREAT RATIO: 53.8 mg/g — AB (ref 0.0–30.0)

## 2014-06-17 NOTE — Telephone Encounter (Signed)
Sue Lushndrea, Will you please let patient know that his blood sugar is stable, no need to start any blood sugar medication.  Kidney function is also stable.  LDL cholesterol is starting to rise but does not appear to be dangerously off, this will improve once he's able to start his walking regimen again.  No need to change his current medication regimen.

## 2014-06-20 NOTE — Telephone Encounter (Signed)
Pt.notified

## 2014-09-15 ENCOUNTER — Ambulatory Visit: Payer: Medicare Other | Admitting: Family Medicine

## 2014-09-19 ENCOUNTER — Ambulatory Visit (INDEPENDENT_AMBULATORY_CARE_PROVIDER_SITE_OTHER): Payer: Medicare Other | Admitting: Family Medicine

## 2014-09-19 ENCOUNTER — Encounter: Payer: Self-pay | Admitting: Family Medicine

## 2014-09-19 VITALS — BP 122/72 | HR 61 | Wt 146.0 lb

## 2014-09-19 DIAGNOSIS — I2510 Atherosclerotic heart disease of native coronary artery without angina pectoris: Secondary | ICD-10-CM | POA: Diagnosis not present

## 2014-09-19 DIAGNOSIS — N189 Chronic kidney disease, unspecified: Secondary | ICD-10-CM

## 2014-09-19 DIAGNOSIS — R7303 Prediabetes: Secondary | ICD-10-CM | POA: Insufficient documentation

## 2014-09-19 DIAGNOSIS — E1122 Type 2 diabetes mellitus with diabetic chronic kidney disease: Secondary | ICD-10-CM | POA: Diagnosis not present

## 2014-09-19 DIAGNOSIS — I10 Essential (primary) hypertension: Secondary | ICD-10-CM

## 2014-09-19 DIAGNOSIS — Z951 Presence of aortocoronary bypass graft: Secondary | ICD-10-CM | POA: Diagnosis not present

## 2014-09-19 LAB — POCT GLYCOSYLATED HEMOGLOBIN (HGB A1C): Hemoglobin A1C: 6.3

## 2014-09-19 NOTE — Progress Notes (Signed)
CC: [name] is a 81 y.o. male is here for Hypertension and Diabetes   Subjective: HPI:  Follow-up type 2 diabetes: Continues to walk up to 5 miles most days a week. No current antihypertensives regimen. Tries to watch what he eats. Nice polyuria plication or polydipsia. Denies poorly healing wounds.  Follow-up essential hypertension: Continues to take benazepril metoprolol andterazosin on a daily basis without known side effects. He brings in a blood pressure log over the past month with a 99% of these readings being in the normotensive range 1% in stage I hypertension. Denies any chest pain shortness of breath orthopnea nor peripheral edema.  Follow-up coronary artery disease: He denies leg claudication chest pain or irregular heartbeat. Denies any change in exercise tolerance    Review Of Systems Outlined In HPI  Past Medical History  Diagnosis Date  . CAD (coronary artery disease) 1995    w/ angioplasty  . Hypertension   . Hyperlipidemia   . Anemia   . BPH (benign prostatic hyperplasia)   . Heart disease     Mild valvular w/ pulm HTN  . SBE (subacute bacterial endocarditis) prophylaxis candidate   . Diabetes mellitus 2004    type 2    Past Surgical History  Procedure Laterality Date  . Angioplasty  1995    [facility]  . Hernia repair      x 2   . Back surgery  1964  . Coronary artery bypass graft  10/27/2009    5 vessel, Dr. Raoul PitchJoel Morgan   Family History  Problem Relation Age of Onset  . Heart disease Mother   . Hypertension Mother   . Alcohol abuse Father   . Depression Daughter   . Depression Son     History   Social History  . Marital Status: Married    Spouse Name: N/A  . Number of Children: N/A  . Years of Education: N/A   Occupational History  . Not on file.   Social History Main Topics  . Smoking status: Never Smoker   . Smokeless tobacco: Not on file  . Alcohol Use: No  . Drug Use: No  . Sexual Activity: Not on file   Other Topics  Concern  . Not on file   Social History Narrative     Objective: BP 122/72 mmHg  Pulse 61  Wt 146 lb (66.225 kg)  General: Alert and Oriented, No Acute Distress HEENT: Pupils equal, round, reactive to light. Conjunctivae clear. Moist mucous membranes  Lungs: Clear to auscultation bilaterally, no wheezing/ronchi/rales.  Comfortable work of breathing. Good air movement. Cardiac: Regular rate and rhythm. Normal S1/S2.  No murmurs, rubs, nor gallops.   Abdomen: Normal bowel sounds, soft and non tender without palpable masses. Extremities: No peripheral edema.  Strong peripheral pulses.  Mental Status: No depression, anxiety, nor agitation. Skin: Warm and dry.  Assessment & Plan: Ryan Bernard was seen today for hypertension and diabetes.  Diagnoses and all orders for this visit:  Type 2 diabetes mellitus with diabetic chronic kidney disease Orders: -     POCT HgB A1C  Essential hypertension, benign  Atherosclerosis of native coronary artery of native heart without angina pectoris  S/P CABG x 5   Type 2 diabetes: A1c 6.3, control, continue diet and exercise interventions Essential hypertension: Controlled continue benazepril metoprolol and terazosin Coronary artery disease: Controlled, continue aspirin, statin, and blood pressure optimization.   Return in about 3 months (around 12/20/2014) for Blood Sugar Check.

## 2014-12-20 ENCOUNTER — Ambulatory Visit: Payer: Medicare Other | Admitting: Family Medicine

## 2014-12-26 ENCOUNTER — Ambulatory Visit: Payer: Medicare Other | Admitting: Family Medicine

## 2015-01-10 ENCOUNTER — Encounter: Payer: Self-pay | Admitting: Family Medicine

## 2015-01-10 ENCOUNTER — Ambulatory Visit (INDEPENDENT_AMBULATORY_CARE_PROVIDER_SITE_OTHER): Payer: Medicare Other | Admitting: Family Medicine

## 2015-01-10 VITALS — BP 130/74 | HR 63 | Wt 149.0 lb

## 2015-01-10 DIAGNOSIS — I10 Essential (primary) hypertension: Secondary | ICD-10-CM | POA: Diagnosis not present

## 2015-01-10 DIAGNOSIS — Z23 Encounter for immunization: Secondary | ICD-10-CM

## 2015-01-10 DIAGNOSIS — N183 Chronic kidney disease, stage 3 unspecified: Secondary | ICD-10-CM

## 2015-01-10 DIAGNOSIS — E1122 Type 2 diabetes mellitus with diabetic chronic kidney disease: Secondary | ICD-10-CM | POA: Diagnosis not present

## 2015-01-10 LAB — POCT GLYCOSYLATED HEMOGLOBIN (HGB A1C): HEMOGLOBIN A1C: 5.7

## 2015-01-10 NOTE — Progress Notes (Signed)
CC: Ryan EisenmengerJoe Bernard is a 81 y.o. male is here for Hyperglycemia   Subjective: HPI:   Follow-up essential hypertension: Takingbenazepril, metoprolol and terazosin on a daily basis. All blood pressures at home are in the normotensive range. He continues to walk up to 5 miles a day. Denies chest pain shortness of breath orthopnea nor peripheral edema  Follow-up type 2 diabetes: He is taking no antihyperglycemic medication since I saw him last. He denies polyuria polyphagia or polydipsia.   Review Of Systems Outlined In HPI  Past Medical History  Diagnosis Date  . CAD (coronary artery disease) 1995    w/ angioplasty  . Hypertension   . Hyperlipidemia   . Anemia   . BPH (benign prostatic hyperplasia)   . Heart disease     Mild valvular w/ pulm HTN  . SBE (subacute bacterial endocarditis) prophylaxis candidate   . Diabetes mellitus 2004    type 2    Past Surgical History  Procedure Laterality Date  . Angioplasty  1995    [facility]  . Hernia repair      x 2   . Back surgery  1964  . Coronary artery bypass graft  10/27/2009    5 vessel, Dr. Raoul PitchJoel Morgan   Family History  Problem Relation Age of Onset  . Heart disease Mother   . Hypertension Mother   . Alcohol abuse Father   . Depression Daughter   . Depression Son     Social History   Social History  . Marital Status: Married    Spouse Name: N/A  . Number of Children: N/A  . Years of Education: N/A   Occupational History  . Not on file.   Social History Main Topics  . Smoking status: Never Smoker   . Smokeless tobacco: Not on file  . Alcohol Use: No  . Drug Use: No  . Sexual Activity: Not on file   Other Topics Concern  . Not on file   Social History Narrative     Objective: BP 130/74 mmHg  Pulse 63  Wt 149 lb (67.586 kg)  General: Alert and Oriented, No Acute Distress HEENT: Pupils equal, round, reactive to light. Conjunctivae clear.  Moist mucous membranes Lungs: Clear to auscultation  bilaterally, no wheezing/ronchi/rales.  Comfortable work of breathing. Good air movement. Cardiac: Regular rate and rhythm. Normal S1/S2.  No murmurs, rubs, nor gallops.   Extremities: No peripheral edema.  Strong peripheral pulses.  Mental Status: No depression, anxiety, nor agitation. Skin: Warm and dry.  Assessment & Plan: Ryan RungJoe was seen today for hyperglycemia.  Diagnoses and all orders for this visit:  Type 2 diabetes mellitus with stage 3 chronic kidney disease, without long-term current use of insulin (HCC) -     POCT HgB A1C  Essential hypertension, benign  Other orders -     Flu Vaccine QUAD 36+ mos IM  type 2 diabetes: A1c of 5.7, control continue exercise program. Essential hypertension: Controlled continue all antihypertensives at the current dosage.   Return in about 3 months (around 04/12/2015).

## 2015-04-12 ENCOUNTER — Encounter: Payer: Self-pay | Admitting: Family Medicine

## 2015-04-12 ENCOUNTER — Ambulatory Visit (INDEPENDENT_AMBULATORY_CARE_PROVIDER_SITE_OTHER): Payer: Medicare Other | Admitting: Family Medicine

## 2015-04-12 VITALS — BP 163/87 | HR 69 | Wt 154.0 lb

## 2015-04-12 DIAGNOSIS — E1122 Type 2 diabetes mellitus with diabetic chronic kidney disease: Secondary | ICD-10-CM

## 2015-04-12 DIAGNOSIS — I10 Essential (primary) hypertension: Secondary | ICD-10-CM

## 2015-04-12 DIAGNOSIS — N183 Chronic kidney disease, stage 3 (moderate): Secondary | ICD-10-CM

## 2015-04-12 LAB — POCT GLYCOSYLATED HEMOGLOBIN (HGB A1C): HEMOGLOBIN A1C: 6

## 2015-04-12 MED ORDER — METOPROLOL SUCCINATE ER 50 MG PO TB24: 50.0000 mg | ORAL_TABLET | Freq: Every day | ORAL | Status: DC

## 2015-04-12 NOTE — Progress Notes (Signed)
CC: Ryan Bernard is a 82 y.o. male is here for Hyperglycemia and Hypertension   Subjective: HPI:  Follow-up hypertension: Currently taking 12.5 mg of metoprolol twice a day. Additionally taking benazepril daily. He checked his blood pressure last week and it was 140/70. He's not been walking as much as he used to due to poor weather outside. Denies chest pain shortness of breath orthopnea nor peripheral edema.  Follow-up type 2 diabetes: Denies polyuria polyphagia or polydipsia. No outside blood sugars to report. Denies poorly healing wounds or vision loss   Review Of Systems Outlined In HPI  Past Medical History  Diagnosis Date  . CAD (coronary artery disease) 1995    w/ angioplasty  . Hypertension   . Hyperlipidemia   . Anemia   . BPH (benign prostatic hyperplasia)   . Heart disease     Mild valvular w/ pulm HTN  . SBE (subacute bacterial endocarditis) prophylaxis candidate   . Diabetes mellitus 2004    type 2    Past Surgical History  Procedure Laterality Date  . Angioplasty  1995    [facility]  . Hernia repair      x 2   . Back surgery  1964  . Coronary artery bypass graft  10/27/2009    5 vessel, Dr. Raoul Pitch   Family History  Problem Relation Age of Onset  . Heart disease Mother   . Hypertension Mother   . Alcohol abuse Father   . Depression Daughter   . Depression Son     Social History   Social History  . Marital Status: Married    Spouse Name: N/A  . Number of Children: N/A  . Years of Education: N/A   Occupational History  . Not on file.   Social History Main Topics  . Smoking status: Never Smoker   . Smokeless tobacco: Not on file  . Alcohol Use: No  . Drug Use: No  . Sexual Activity: Not on file   Other Topics Concern  . Not on file   Social History Narrative     Objective: BP 163/87 mmHg  Pulse 69  Wt 154 lb (69.854 kg)  General: Alert and Oriented, No Acute Distress HEENT: Pupils equal, round, reactive to light.  Conjunctivae clear.  Moist mucous membranes Lungs: Clear to auscultation bilaterally, no wheezing/ronchi/rales.  Comfortable work of breathing. Good air movement. Cardiac: Regular rate and rhythm. Normal S1/S2.  No  rubs, nor gallops.  Grade 1/6 holosystolic murmur Extremities: No peripheral edema.  Strong peripheral pulses.  Mental Status: No depression, anxiety, nor agitation. Skin: Warm and dry.  Assessment & Plan: Ryan Bernard was seen today for hyperglycemia and hypertension.  Diagnoses and all orders for this visit:  Essential hypertension, benign  Type 2 diabetes mellitus with stage 3 chronic kidney disease, without long-term current use of insulin (HCC)  Other orders -     metoprolol succinate (TOPROL-XL) 50 MG 24 hr tablet; Take 1 tablet (50 mg total) by mouth daily. Take with or immediately following a meal.   Essential hypertension: Uncontrolled chronic condition of encouragement to increase metoprolol, he wants to discuss it with his [facility] doctor first. No changed to benazepril. Type 2 diabetes: Controlled with an A1c of 6.0. No need to start anti-hyperglycemics. Encouraged to start walking regimen again once the weather permits.  Return in about 3 months (around 07/10/2015).

## 2015-04-12 NOTE — Addendum Note (Signed)
Addended by: Thom Chimes on: 04/12/2015 10:25 AM   Modules accepted: Orders

## 2015-04-12 NOTE — Patient Instructions (Signed)
Call [phone] when you get home to tell me which medications you're taking at home.

## 2015-07-10 ENCOUNTER — Encounter: Payer: Self-pay | Admitting: Family Medicine

## 2015-07-10 ENCOUNTER — Ambulatory Visit (INDEPENDENT_AMBULATORY_CARE_PROVIDER_SITE_OTHER): Payer: Medicare Other | Admitting: Family Medicine

## 2015-07-10 VITALS — BP 177/74 | HR 58 | Wt 152.0 lb

## 2015-07-10 DIAGNOSIS — E1122 Type 2 diabetes mellitus with diabetic chronic kidney disease: Secondary | ICD-10-CM

## 2015-07-10 DIAGNOSIS — B372 Candidiasis of skin and nail: Secondary | ICD-10-CM | POA: Diagnosis not present

## 2015-07-10 DIAGNOSIS — N183 Chronic kidney disease, stage 3 unspecified: Secondary | ICD-10-CM

## 2015-07-10 DIAGNOSIS — I10 Essential (primary) hypertension: Secondary | ICD-10-CM | POA: Diagnosis not present

## 2015-07-10 LAB — POCT GLYCOSYLATED HEMOGLOBIN (HGB A1C): Hemoglobin A1C: 5.7

## 2015-07-10 MED ORDER — METOPROLOL SUCCINATE ER 50 MG PO TB24: 50.0000 mg | ORAL_TABLET | Freq: Every day | ORAL | Status: DC

## 2015-07-10 MED ORDER — CLOTRIMAZOLE 1 % EX CREA: TOPICAL_CREAM | CUTANEOUS | Status: AC

## 2015-07-10 NOTE — Progress Notes (Signed)
CC: Ryan Bernard is a 82 y.o. male is here for Hyperglycemia and Rash   Subjective: HPI:  Follow-up essential hypertension: He is noticing that blood pressures at home are consistently in the stage I hypertension range despite taking 25 mg of metoprolol daily. He denies chest pain shortness of breath orthopnea or peripheral edema.  Follow-up type 2 diabetes: Outside blood sugars to report. He's walking between 3-5 miles a day. Denies any polyuria or polyphagia or polydipsia. No vision disturbance.  His major complaint today is a rash and the groin bilaterally. He's been using an over-the-counter diaper rash cream but it only helps to mild degree. It's been present for at least 2 weeks now. Does not seem to be getting any worse provided he uses the above regimen. He denies any skin changes elsewhere.   Review Of Systems Outlined In HPI  Past Medical History  Diagnosis Date  . CAD (coronary artery disease) 1995    w/ angioplasty  . Hypertension   . Hyperlipidemia   . Anemia   . BPH (benign prostatic hyperplasia)   . Heart disease     Mild valvular w/ pulm HTN  . SBE (subacute bacterial endocarditis) prophylaxis candidate   . Diabetes mellitus 2004    type 2    Past Surgical History  Procedure Laterality Date  . Angioplasty  1995    [facility]  . Hernia repair      x 2   . Back surgery  1964  . Coronary artery bypass graft  10/27/2009    5 vessel, Dr. Raoul Pitch   Family History  Problem Relation Age of Onset  . Heart disease Mother   . Hypertension Mother   . Alcohol abuse Father   . Depression Daughter   . Depression Son     Social History   Social History  . Marital Status: Married    Spouse Name: N/A  . Number of Children: N/A  . Years of Education: N/A   Occupational History  . Not on file.   Social History Main Topics  . Smoking status: Never Smoker   . Smokeless tobacco: Not on file  . Alcohol Use: No  . Drug Use: No  . Sexual Activity: Not on  file   Other Topics Concern  . Not on file   Social History Narrative     Objective: BP 177/74 mmHg  Pulse 58  Wt 152 lb (68.947 kg)  General: Alert and Oriented, No Acute Distress HEENT: Pupils equal, round, reactive to light. Conjunctivae clear.  Moist mucous membranes Lungs: Clear to auscultation bilaterally, no wheezing/ronchi/rales.  Comfortable work of breathing. Good air movement. Cardiac: Regular rate and rhythm. Normal S1/S2.  No murmurs, rubs, nor gallops.   Extremities: No peripheral edema.  Strong peripheral pulses.  Mental Status: No depression, anxiety, nor agitation. Skin: Warm and dry. Patches of erythema and both groin with satellite lesions.  Assessment & Plan: Ryan Bernard was seen today for hyperglycemia and rash.  Diagnoses and all orders for this visit:  Essential hypertension, benign  Type 2 diabetes mellitus with stage 3 chronic kidney disease, without long-term current use of insulin (HCC) -     POCT HgB A1C  Skin candidiasis -     clotrimazole (LOTRIMIN) 1 % cream; Apply to affected areas twice a day for up to four weeks, applying up to two weeks after resolution of symptoms.  Other orders -     metoprolol succinate (TOPROL-XL) 50 MG 24 hr tablet; Take  1 tablet (50 mg total) by mouth daily. Take with or immediately following a meal.   Essential hypertension: Uncontrolled chronic condition increasing metoprolol Type 2 diabetes: A1c of 5.7, controlled, continue diet and exercise interventions Candidiasis: Start clotrimazole contact me if no better after 2 weeks.  Return in about 3 months (around 10/10/2015) for sugar.

## 2015-08-28 ENCOUNTER — Encounter: Payer: Self-pay | Admitting: Family Medicine

## 2015-08-28 ENCOUNTER — Ambulatory Visit (INDEPENDENT_AMBULATORY_CARE_PROVIDER_SITE_OTHER): Payer: Medicare Other | Admitting: Family Medicine

## 2015-08-28 VITALS — BP 158/67 | HR 61 | Temp 98.2°F | Wt 158.0 lb

## 2015-08-28 DIAGNOSIS — J209 Acute bronchitis, unspecified: Secondary | ICD-10-CM

## 2015-08-28 MED ORDER — AZITHROMYCIN 250 MG PO TABS: ORAL_TABLET | ORAL | Status: AC

## 2015-08-28 NOTE — Progress Notes (Signed)
CC: Ryan Bernard is a 82 y.o. male is here for Cough; Shortness of Breath; and Wheezing   Subjective: HPI:  Nonproductive cough, wheezing, shortness of breath with activity. Symptoms have been present ever since Tuesday of last week. Small benefit from Mucinex. No other interventions as of yet. He denies any fevers, chills, sore throat or head congestion. He denies any skin changes. He not been able to cough up much mucus and denies any blood in sputum. Symptoms are moderate in severity present all hours of the day and worsened in humid environments.   Review Of Systems Outlined In HPI  Past Medical History  Diagnosis Date  . CAD (coronary artery disease) 1995    w/ angioplasty  . Hypertension   . Hyperlipidemia   . Anemia   . BPH (benign prostatic hyperplasia)   . Heart disease     Mild valvular w/ pulm HTN  . SBE (subacute bacterial endocarditis) prophylaxis candidate   . Diabetes mellitus 2004    type 2    Past Surgical History  Procedure Laterality Date  . Angioplasty  1995    [facility]  . Hernia repair      x 2   . Back surgery  1964  . Coronary artery bypass graft  10/27/2009    5 vessel, Dr. Raoul PitchJoel Morgan   Family History  Problem Relation Age of Onset  . Heart disease Mother   . Hypertension Mother   . Alcohol abuse Father   . Depression Daughter   . Depression Son     Social History   Social History  . Marital Status: Married    Spouse Name: N/A  . Number of Children: N/A  . Years of Education: N/A   Occupational History  . Not on file.   Social History Main Topics  . Smoking status: Never Smoker   . Smokeless tobacco: Not on file  . Alcohol Use: No  . Drug Use: No  . Sexual Activity: Not on file   Other Topics Concern  . Not on file   Social History Narrative     Objective: BP 158/67 mmHg  Pulse 61  Temp(Src) 98.2 F (36.8 C) (Oral)  Wt 158 lb (71.668 kg)  SpO2 92%  General: Alert and Oriented, No Acute Distress HEENT: Pupils  equal, round, reactive to light. Conjunctivae clear. Moist mucous membranes pharynx is unremarkable Lungs: Clear to auscultation bilaterally, no wheezing/ronchi/rales.  Comfortable work of breathing. Good air movement. Cardiac: Regular rate and rhythm. Normal S1/S2.  No murmurs, rubs, nor gallops.   Extremities: No peripheral edema.  Strong peripheral pulses.  Mental Status: No depression, anxiety, nor agitation. Skin: Warm and dry.  Assessment & Plan: Ryan Bernard was seen today for cough, shortness of breath and wheezing.  Diagnoses and all orders for this visit:  Acute bronchitis, unspecified organism -     azithromycin (ZITHROMAX) 250 MG tablet; Take two tabs at once on day 1, then one tab daily on days 2-5.  Continue Mucinex, start azithromycin, call on Wednesday if no better the next step would be to add prednisone.Signs and symptoms requring emergent/urgent reevaluation were discussed with the patient.   Return if symptoms worsen or fail to improve.

## 2015-10-10 ENCOUNTER — Encounter: Payer: Self-pay | Admitting: Family Medicine

## 2015-10-10 ENCOUNTER — Ambulatory Visit (INDEPENDENT_AMBULATORY_CARE_PROVIDER_SITE_OTHER): Payer: Medicare Other | Admitting: Family Medicine

## 2015-10-10 VITALS — BP 173/88 | HR 54 | Wt 153.0 lb

## 2015-10-10 DIAGNOSIS — I10 Essential (primary) hypertension: Secondary | ICD-10-CM

## 2015-10-10 DIAGNOSIS — N183 Chronic kidney disease, stage 3 unspecified: Secondary | ICD-10-CM

## 2015-10-10 DIAGNOSIS — E1122 Type 2 diabetes mellitus with diabetic chronic kidney disease: Secondary | ICD-10-CM

## 2015-10-10 DIAGNOSIS — Z23 Encounter for immunization: Secondary | ICD-10-CM

## 2015-10-10 LAB — POCT GLYCOSYLATED HEMOGLOBIN (HGB A1C): Hemoglobin A1C: 5.6

## 2015-10-10 NOTE — Patient Instructions (Signed)
Tavian can you please use this space to write down your daily blood pressure for the next week and drop it off at the front desk sometime next week:                         .

## 2015-10-10 NOTE — Progress Notes (Signed)
CC: Ryan Bernard is a 82 y.o. male is here for Hyperglycemia and Hypertension   Subjective: HPI:  Follow-up essential hypertension: He tells me that his numbers are always in the prehypertensive or normotensive state at home except for today when he noticed a systolic of 160. He checks his blood pressure multiple times of the week. He denies any recent change in his medications. Still Takes terazosin, Toprol, benazepril.  Denies chest pain shortness of breath orthopnea nor peripheral edema.  Follow-up type 2 diabetes: He is walking about 2 miles a day for exercise. Denies polyuria polyphagia polydipsia or vision loss. No motor or sensory disturbances. No outside blood sugars to report.   Review Of Systems Outlined In HPI  Past Medical History:  Diagnosis Date  . Anemia   . BPH (benign prostatic hyperplasia)   . CAD (coronary artery disease) 1995   w/ angioplasty  . Diabetes mellitus 2004   type 2  . Heart disease    Mild valvular w/ pulm HTN  . Hyperlipidemia   . Hypertension   . SBE (subacute bacterial endocarditis) prophylaxis candidate     Past Surgical History:  Procedure Laterality Date  . ANGIOPLASTY  1995   [facility]  . BACK SURGERY  1964  . CORONARY ARTERY BYPASS GRAFT  10/27/2009   5 vessel, Dr. Raoul Pitch  . HERNIA REPAIR     x 2    Family History  Problem Relation Age of Onset  . Heart disease Mother   . Hypertension Mother   . Alcohol abuse Father   . Depression Daughter   . Depression Son     Social History   Social History  . Marital status: Married    Spouse name: N/A  . Number of children: N/A  . Years of education: N/A   Occupational History  . Not on file.   Social History Main Topics  . Smoking status: Never Smoker  . Smokeless tobacco: Not on file  . Alcohol use No  . Drug use: No  . Sexual activity: Not on file   Other Topics Concern  . Not on file   Social History Narrative  . No narrative on file     Objective: BP (!)  173/88   Pulse (!) 54   Wt 153 lb (69.4 kg)   BMI 23.26 kg/m   General: Alert and Oriented, No Acute Distress HEENT: Pupils equal, round, reactive to light. Conjunctivae clear. Moist mucous membranes Lungs: Clear to auscultation bilaterally, no wheezing/ronchi/rales.  Comfortable work of breathing. Good air movement. Cardiac: Regular rate and rhythm. Normal S1/S2.  No murmurs, rubs, nor gallops.   Extremities: No peripheral edema.  Strong peripheral pulses.  Mental Status: No depression, anxiety, nor agitation. Skin: Warm and dry.  Assessment & Plan: Vann was seen today for hyperglycemia and hypertension.  Diagnoses and all orders for this visit:  Essential hypertension, benign  Type 2 diabetes mellitus with stage 3 chronic kidney disease, without long-term current use of insulin (HCC) -     POCT HgB A1C   Essential hypertension: Uncontrolled chronic condition, since he is reporting that blood pressures at home are overall normal I like him to use his after visit summary to write down his daily blood pressures for the next week and drop this off at the office sometime next week. I'll use these numbers to determine if any adjustments need to be done on his metoprolol. Thank you diabetes: A1c of 5.8, controlled continue diet and exercise interventions.  Discussed with this patient that I will be resigning from my position here with [facility] in September in order to stay with my family who will be moving to [facility]. I let him know about the providers that are still accepting patients and I feel that this individual will be under great care if he/she stays here with [facility].  Return in about 3 months (around 01/10/2016) for Follow up with Dr. Lyn Hollingshead.

## 2015-10-20 ENCOUNTER — Telehealth: Payer: Self-pay | Admitting: Family Medicine

## 2015-10-20 NOTE — Telephone Encounter (Signed)
Will you please let patient know that his BP numbers at home look great.  I don't think he needs to change his current blood pressure regimen.  As long as his BP numbers don't start to rise at home no need to follow up until three months from now.

## 2015-10-20 NOTE — Telephone Encounter (Signed)
Pt.notified

## 2015-11-02 ENCOUNTER — Encounter: Payer: Self-pay | Admitting: Family Medicine

## 2015-11-02 ENCOUNTER — Ambulatory Visit (INDEPENDENT_AMBULATORY_CARE_PROVIDER_SITE_OTHER): Payer: Medicare Other | Admitting: Family Medicine

## 2015-11-02 VITALS — BP 173/78 | HR 54 | Wt 151.0 lb

## 2015-11-02 DIAGNOSIS — E1122 Type 2 diabetes mellitus with diabetic chronic kidney disease: Secondary | ICD-10-CM

## 2015-11-02 DIAGNOSIS — N183 Chronic kidney disease, stage 3 unspecified: Secondary | ICD-10-CM

## 2015-11-02 DIAGNOSIS — Z0000 Encounter for general adult medical examination without abnormal findings: Secondary | ICD-10-CM | POA: Diagnosis not present

## 2015-11-02 DIAGNOSIS — I10 Essential (primary) hypertension: Secondary | ICD-10-CM | POA: Diagnosis not present

## 2015-11-02 LAB — LIPID PANEL
CHOLESTEROL: 137 mg/dL (ref 125–200)
HDL: 60 mg/dL (ref >=40)
LDL Cholesterol: 67 mg/dL (ref <130)
Total CHOL/HDL Ratio: 2.3 Ratio (ref <=5.0)
Triglycerides: 49 mg/dL (ref <150)
VLDL: 10 mg/dL (ref <30)

## 2015-11-02 LAB — COMPLETE METABOLIC PANEL WITH GFR
ALT: 9 U/L (ref 9–46)
AST: 17 U/L (ref 10–35)
Albumin: 4 g/dL (ref 3.6–5.1)
Alkaline Phosphatase: 69 U/L (ref 40–115)
BILIRUBIN TOTAL: 0.7 mg/dL (ref 0.2–1.2)
BUN: 16 mg/dL (ref 7–25)
CHLORIDE: 102 mmol/L (ref 98–110)
CO2: 31 mmol/L (ref 20–31)
CREATININE: 1.14 mg/dL — AB (ref 0.70–1.11)
Calcium: 9.4 mg/dL (ref 8.6–10.3)
GFR, Est African American: 69 mL/min (ref >=60)
GFR, Est Non African American: 60 mL/min (ref >=60)
GLUCOSE: 108 mg/dL — AB (ref 65–99)
Potassium: 4.2 mmol/L (ref 3.5–5.3)
SODIUM: 141 mmol/L (ref 135–146)
TOTAL PROTEIN: 6.8 g/dL (ref 6.1–8.1)

## 2015-11-02 LAB — CBC
HCT: 38.5 % (ref 38.5–50.0)
Hemoglobin: 13.1 g/dL — ABNORMAL LOW (ref 13.2–17.1)
MCH: 31.7 pg (ref 27.0–33.0)
MCHC: 34 g/dL (ref 32.0–36.0)
MCV: 93.2 fL (ref 80.0–100.0)
MPV: 11.9 fL (ref 7.5–12.5)
PLATELETS: 106 10*3/uL — AB (ref 140–400)
RBC: 4.13 MIL/uL — AB (ref 4.20–5.80)
RDW: 13.4 % (ref 11.0–15.0)
WBC: 5.1 10*3/uL (ref 3.8–10.8)

## 2015-11-02 NOTE — Progress Notes (Signed)
Subjective:    Ryan Bernard is a 82 y.o. male who presents for Medicare Annual/Subsequent preventive examination.   Preventive Screening-Counseling & Management  Tobacco History  Smoking Status  . Never Smoker  Smokeless Tobacco  . Not on file      Influenza Vaccine: received today Pneumovax: UTD Td/Tdap: UTD Zoster:Delined $$$   Problems Prior to Visit 1. DM2  Current Problems (verified) Patient Active Problem List   Diagnosis Date Noted  . Type 2 diabetes mellitus with diabetic chronic kidney disease (HCC) 09/19/2014  . Postherpetic neuralgia 06/10/2013  . Chronic kidney disease, stage 3 05/06/2012  . Hyperlipidemia LDL goal < 70 05/05/2012  . S/P CABG x 5 05/05/2012  . MICROALBUMINURIA 08/07/2007  . PULMONARY HYPERTENSION 04/07/2007  . Essential hypertension, benign 03/13/2007  . Coronary atherosclerosis 03/13/2007  . SOLAR KERATOSIS 03/13/2007  . BENIGN PROSTATIC HYPERTROPHY, HX OF 03/13/2007    Medications Prior to Visit Current Outpatient Prescriptions on File Prior to Visit  Medication Sig Dispense Refill  . aspirin 325 MG tablet Take 325 mg by mouth daily.      . benazepril (LOTENSIN) 40 MG tablet Take 40 mg by mouth daily.      . metoprolol succinate (TOPROL-XL) 50 MG 24 hr tablet Take 1 tablet (50 mg total) by mouth daily. Take with or immediately following a meal. 90 tablet 3  . pravastatin (PRAVACHOL) 80 MG tablet Take 1 tablet (80 mg total) by mouth at bedtime. 90 tablet 3  . terazosin (HYTRIN) 10 MG capsule Take 10 mg by mouth daily.       No current facility-administered medications on file prior to visit.     Current Medications (verified) Current Outpatient Prescriptions  Medication Sig Dispense Refill  . aspirin 325 MG tablet Take 325 mg by mouth daily.      . benazepril (LOTENSIN) 40 MG tablet Take 40 mg by mouth daily.      . metoprolol succinate (TOPROL-XL) 50 MG 24 hr tablet Take 1 tablet (50 mg total) by mouth daily. Take with or  immediately following a meal. 90 tablet 3  . pravastatin (PRAVACHOL) 80 MG tablet Take 1 tablet (80 mg total) by mouth at bedtime. 90 tablet 3  . terazosin (HYTRIN) 10 MG capsule Take 10 mg by mouth daily.       No current facility-administered medications for this visit.      Allergies (verified) Review of patient's allergies indicates no known allergies.   PAST HISTORY  Family History Family History  Problem Relation Age of Onset  . Heart disease Mother   . Hypertension Mother   . Alcohol abuse Father   . Depression Daughter   . Depression Son     Social History Social History  Substance Use Topics  . Smoking status: Never Smoker  . Smokeless tobacco: Not on file  . Alcohol use No    Are there smokers in your home (other than you)?  No  Risk Factors Current exercise habits: walking, miles  Dietary issues discussed: DASH   Cardiac risk factors: advanced age (older than 29 for men, 14 for women), diabetes mellitus and hypertension.  Depression Screen (Note: if answer to either of the following is "Yes", a more complete depression screening is indicated)   Q1: Over the past two weeks, have you felt down, depressed or hopeless? No  Q2: Over the past two weeks, have you felt little interest or pleasure in doing things? No  Have you lost interest or pleasure  in daily life? No  Do you often feel hopeless? No  Do you cry easily over simple problems? No  Activities of Daily Living In your present state of health, do you have any difficulty performing the following activities?:  Driving? No Managing money?  No Feeding yourself? No Getting from bed to chair? No Climbing a flight of stairs? No Preparing food and eating?: No Bathing or showering? No Getting dressed: No Getting to the toilet? No Using the toilet:No Moving around from place to place: No In the past year have you fallen or had a near fall?:No   Are you sexually active?  No  Do you have more than one  partner?  No  Hearing Difficulties: No Do you often ask people to speak up or repeat themselves? No Do you experience ringing or noises in your ears? No Do you have difficulty understanding soft or whispered voices? No   Do you feel that you have a problem with memory? No  Do you often misplace items? No  Do you feel safe at home?  Yes  Cognitive Testing  Alert? Yes  Normal Appearance?Yes  Oriented to person? Yes  Place? Yes   Time? Yes  Recall of three objects?  Yes  Can perform simple calculations? Yes  Displays appropriate judgment?Yes  Can read the correct time from a watch face?Yes   Advanced Directives have been discussed with the patient? Yes   List the Names of Other Physician/Practitioners you currently use: 1.    Indicate any recent Medical Services you may have received from other than [facility] providers in the past year (date may be approximate).  Immunization History  Administered Date(s) Administered  . Influenza Split 12/24/2011  . Influenza Whole 01/09/2009, 12/20/2009, 12/28/2010  . Influenza,inj,Quad PF,36+ Mos 11/06/2012, 12/14/2013, 01/10/2015  . Pneumococcal Conjugate-13 03/16/2014  . Pneumococcal Polysaccharide-23 08/20/2011  . Td 12/10/2002  . Tdap 10/10/2015    Screening Tests Health Maintenance  Topic Date Due  . ZOSTAVAX  08/18/1993  . OPHTHALMOLOGY EXAM  10/15/2013  . FOOT EXAM  09/19/2015  . INFLUENZA VACCINE  10/10/2015  . HEMOGLOBIN A1C  04/11/2016  . TETANUS/TDAP  10/09/2025  . PNA vac Low Risk Adult  Completed    All answers were reviewed with the patient and necessary referrals were made:  Laren Boom, DO   11/02/2015   History reviewed: allergies, current medications, past medical history, past social history, past surgical history and problem list  Review of Systems Review of Systems - General ROS: negative for - chills, fever, night sweats, weight gain or weight loss Ophthalmic ROS: negative for - decreased  vision Psychological ROS: negative for - anxiety or depression ENT ROS: negative for - hearing change, nasal congestion, tinnitus or allergies Hematological and Lymphatic ROS: negative for - bleeding problems, bruising or swollen lymph nodes Breast ROS: negative Respiratory ROS: no cough, shortness of breath, or wheezing Cardiovascular ROS: no chest pain or dyspnea on exertion Gastrointestinal ROS: no abdominal pain, change in bowel habits, or black or bloody stools Genito-Urinary ROS: negative for - genital discharge, genital ulcers, incontinence or abnormal bleeding from genitals Musculoskeletal ROS: negative for - joint pain or muscle pain Neurological ROS: negative for - headaches or memory loss Dermatological ROS: negative for lumps, mole changes, rash and skin lesion changes   Objective:     Vision by Snellen chart: right eye:20/70, left eye:20/40 There were no vitals taken for this visit. There is no height or weight on file to calculate BMI.  General: No Acute Distress HEENT: Atraumatic, normocephalic, conjunctivae normal without scleral icterus.  No nasal discharge, hearing grossly intact, TMs with good landmarks bilaterally with no middle ear abnormalities, posterior pharynx clear without oral lesions. Neck: Supple, trachea midline, no cervical nor supraclavicular adenopathy. Pulmonary: Clear to auscultation bilaterally without wheezing, rhonchi, nor rales. Cardiac: Regular rate and rhythm.  No murmurs, rubs, nor gallops. No peripheral edema.  2+ peripheral pulses bilaterally. Abdomen: Bowel sounds normal.  No masses.  Non-tender without rebound.  Negative Murphy's sign. MSK: Grossly intact, no signs of weakness.  Full strength throughout upper and lower extremities.  Full ROM in upper and lower extremities.  No midline spinal tenderness. Neuro: Gait unremarkable, CN II-XII grossly intact.  C5-C6 Reflex 2/4 Bilaterally, L4 Reflex 2/4 Bilaterally.  Cerebellar function  intact. Skin: No rashes. Psych: Alert and oriented to person/place/time.  Thought process normal. No anxiety/depression.     Assessment:     Due for flu shot     Plan:     During the course of the visit the patient was educated and counseled about appropriate screening and preventive services including:    flu shot  Diet review for nutrition referral?   Not Indicated ____   Patient Instructions (the written plan) was given to the patient.  Medicare Attestation I have personally reviewed: The patient's medical and social history Their use of alcohol, tobacco or illicit drugs Their current medications and supplements The patient's functional ability including ADLs,fall risks, home safety risks, cognitive, and hearing and visual impairment Diet and physical activities Evidence for depression or mood disorders  The patient's weight, height, BMI, and visual acuity have been recorded in the chart.  I have made referrals, counseling, and provided education to the patient based on review of the above and I have provided the patient with a written personalized care plan for preventive services.     Laren BoomHommel, Win Guajardo, DO   11/02/2015

## 2016-01-10 ENCOUNTER — Telehealth: Payer: Self-pay | Admitting: Osteopathic Medicine

## 2016-01-10 ENCOUNTER — Encounter: Payer: Self-pay | Admitting: Osteopathic Medicine

## 2016-01-10 ENCOUNTER — Ambulatory Visit: Payer: Medicare Other | Admitting: Osteopathic Medicine

## 2016-01-10 ENCOUNTER — Ambulatory Visit (INDEPENDENT_AMBULATORY_CARE_PROVIDER_SITE_OTHER): Payer: Medicare Other | Admitting: Osteopathic Medicine

## 2016-01-10 VITALS — BP 157/79 | HR 65 | Ht 67.0 in | Wt 153.0 lb

## 2016-01-10 DIAGNOSIS — N183 Chronic kidney disease, stage 3 (moderate): Secondary | ICD-10-CM

## 2016-01-10 DIAGNOSIS — I10 Essential (primary) hypertension: Secondary | ICD-10-CM

## 2016-01-10 DIAGNOSIS — E1122 Type 2 diabetes mellitus with diabetic chronic kidney disease: Secondary | ICD-10-CM

## 2016-01-10 DIAGNOSIS — Z951 Presence of aortocoronary bypass graft: Secondary | ICD-10-CM

## 2016-01-10 NOTE — Progress Notes (Signed)
HPI: Ryan Bernard is a 82 y.o. male  who presents to [facility] today, 01/10/16,  for chief complaint of:  Chief Complaint  Patient presents with  . Establish Care    Patient here to establish care, previous PCP has left the practice. Overall patient feeling well today. No complaints.  Coronary artery disease: Patient is status post CABG 5. On high-dose aspirin, benazepril, metoprolol, pravastatin. No chest pain or claudication symptoms.  Hypertension: BP elevated today, no chest pain, pressure, shortness of breath. Patient states home blood pressure measurements have been fine but his home cuff has not been verified in several years.  Type 2 diabetes: Last A1c indicates very good control on no medications. No hypoglycemic symptoms.    Past medical, surgical, social and family history reviewed: Past Medical History:  Diagnosis Date  . Anemia   . BPH (benign prostatic hyperplasia)   . CAD (coronary artery disease) 1995   w/ angioplasty  . Diabetes mellitus 2004   type 2  . Heart disease    Mild valvular w/ pulm HTN  . Hyperlipidemia   . Hypertension   . SBE (subacute bacterial endocarditis) prophylaxis candidate    Past Surgical History:  Procedure Laterality Date  . ANGIOPLASTY  1995   [facility]  . BACK SURGERY  1964  . CORONARY ARTERY BYPASS GRAFT  10/27/2009   5 vessel, Dr. Raoul Pitch  . HERNIA REPAIR     x 2    Social History  Substance Use Topics  . Smoking status: Never Smoker  . Smokeless tobacco: Never Used  . Alcohol use No   Family History  Problem Relation Age of Onset  . Heart disease Mother   . Hypertension Mother   . Alcohol abuse Father   . Depression Daughter   . Depression Son      Current medication list and allergy/intolerance information reviewed:   Current Outpatient Prescriptions on File Prior to Visit  Medication Sig Dispense Refill  . aspirin 325 MG tablet Take 325 mg by mouth daily.       . benazepril (LOTENSIN) 40 MG tablet Take 40 mg by mouth daily.      . metoprolol succinate (TOPROL-XL) 50 MG 24 hr tablet Take 1 tablet (50 mg total) by mouth daily. Take with or immediately following a meal. 90 tablet 3  . pravastatin (PRAVACHOL) 80 MG tablet Take 1 tablet (80 mg total) by mouth at bedtime. 90 tablet 3  . terazosin (HYTRIN) 10 MG capsule Take 10 mg by mouth daily.       No current facility-administered medications on file prior to visit.    No Known Allergies    Review of Systems:  Constitutional: No recent illness  HEENT: No  headache, no vision change  Cardiac: No  chest pain, No  pressure, No palpitations  Respiratory:  No  shortness of breath. No  Cough  Gastrointestinal: No  abdominal pain, no change on bowel habits  Musculoskeletal: No new myalgia/arthralgia  Skin: No  Rash  Hem/Onc: +easy bruising/bleeding  Neurologic: No  weakness, No  Dizziness   Exam:  BP (!) 157/79   Pulse 65   Ht 5\' 7"  (1.702 m)   Wt 153 lb (69.4 kg)   BMI 23.96 kg/m   Constitutional: VS see above. General Appearance: alert, well-developed, well-nourished, NAD  Eyes: Normal lids and conjunctive, non-icteric sclera  Ears, Nose, Mouth, Throat: MMM, Normal external inspection ears/nares/mouth/lips/gums.  Neck: No masses, trachea midline.  Respiratory: Normal respiratory effort. no wheeze, no rhonchi, no rales  Cardiovascular: S1/S2 normal, +murmur, no rub/gallop auscultated. RRR.   Musculoskeletal: Gait normal. Symmetric and independent movement of all extremities  Neurological: Normal balance/coordination. No tremor.  Skin: warm, dry, intact.   Psychiatric: Normal judgment/insight. Normal mood and affect. Oriented x3.    Recent Results (from the past 2160 hour(s))  Lipid panel     Status: None   Collection Time: 11/02/15 10:29 AM  Result Value Ref Range   Cholesterol 137 125 - 200 mg/dL   Triglycerides 49 <161<150 mg/dL   HDL 60 >=09>=40 mg/dL   Total CHOL/HDL  Ratio 2.3 <=5.0 Ratio   VLDL 10 <30 mg/dL   LDL Cholesterol 67 <604<130 mg/dL    Comment:   Total Cholesterol/HDL Ratio:CHD Risk                        Coronary Heart Disease Risk Table                                        Men       Women          1/2 Average Risk              3.4        3.3              Average Risk              5.0        4.4           2X Average Risk              9.6        7.1           3X Average Risk             23.4       11.0 Use the calculated Patient Ratio above and the CHD Risk table  to determine the patient's CHD Risk.   CBC     Status: Abnormal   Collection Time: 11/02/15 10:29 AM  Result Value Ref Range   WBC 5.1 3.8 - 10.8 K/uL   RBC 4.13 (L) 4.20 - 5.80 MIL/uL   Hemoglobin 13.1 (L) 13.2 - 17.1 g/dL   HCT 54.038.5 98.138.5 - 19.150.0 %   MCV 93.2 80.0 - 100.0 fL   MCH 31.7 27.0 - 33.0 pg   MCHC 34.0 32.0 - 36.0 g/dL   RDW 47.813.4 29.511.0 - 62.115.0 %   Platelets 106 (L) 140 - 400 K/uL   MPV 11.9 7.5 - 12.5 fL  COMPLETE METABOLIC PANEL WITH GFR     Status: Abnormal   Collection Time: 11/02/15 10:29 AM  Result Value Ref Range   Sodium 141 135 - 146 mmol/L   Potassium 4.2 3.5 - 5.3 mmol/L   Chloride 102 98 - 110 mmol/L   CO2 31 20 - 31 mmol/L   Glucose, Bld 108 (H) 65 - 99 mg/dL   BUN 16 7 - 25 mg/dL   Creat 3.081.14 (H) 6.570.70 - 1.11 mg/dL    Comment:   For patients > or = 80 years of age: The upper reference limit for Creatinine is approximately 13% higher for people identified as African-American.      Total Bilirubin 0.7 0.2 - 1.2 mg/dL   Alkaline  Phosphatase 69 40 - 115 U/L   AST 17 10 - 35 U/L   ALT 9 9 - 46 U/L   Total Protein 6.8 6.1 - 8.1 g/dL   Albumin 4.0 3.6 - 5.1 g/dL   Calcium 9.4 8.6 - 16.110.3 mg/dL   GFR, Est African American 69 >=60 mL/min   GFR, Est Non African American 60 >=60 mL/min     ASSESSMENT/PLAN:   Essential hypertension, benign - Patient was instructed to return to clinic with home blood pressure cuff later today for verification, did  not show for nurse visit. His home blood pressure cuff is verified accurate, patient can take home blood pressure readings over the course of about 2 weeks and bring these into the office. If home blood pressure cuff is not accurate, we'll need to consider increase dose/change antihypertensive medication  Type 2 diabetes mellitus with stage 3 chronic kidney disease, without long-term current use of insulin (HCC) - Well-controlled on no antidiabetic medications.  S/P CABG x 5 - On appropriate secondary prevention, advised can decrease aspirin to baby aspirin, would not worry about changing statins at this point   All questions at time of visit were answered - patient instructed to contact office with any additional concerns. ER/RTC precautions were reviewed with the patient. Follow-up plan: Return in about 4 weeks (around 02/07/2016) for BP recheck and needs to bring home monitor and home BP readings, sooner if needed.

## 2016-01-10 NOTE — Telephone Encounter (Signed)
Patient did not come back to the office with home blood pressure cuff for verification. Please ask that he schedule a nurse visit sometime in the next 1-2 weeks to do this, or can schedule a visit with me. He needs to be keeping track of home blood pressures, bringing his home blood pressure readings and his blood pressure monitor to each doctor visit.

## 2016-01-11 NOTE — Telephone Encounter (Signed)
Patient has been informed to come in on nurse schedule to recheck blood pressure. Codi Folkerts,CMA

## 2016-01-24 ENCOUNTER — Ambulatory Visit: Payer: Medicare Other | Admitting: Osteopathic Medicine

## 2016-01-26 ENCOUNTER — Ambulatory Visit (INDEPENDENT_AMBULATORY_CARE_PROVIDER_SITE_OTHER): Payer: Medicare Other | Admitting: Osteopathic Medicine

## 2016-01-26 ENCOUNTER — Encounter: Payer: Self-pay | Admitting: Osteopathic Medicine

## 2016-01-26 VITALS — BP 155/65 | HR 62 | Ht 67.0 in | Wt 155.0 lb

## 2016-01-26 DIAGNOSIS — I10 Essential (primary) hypertension: Secondary | ICD-10-CM | POA: Diagnosis not present

## 2016-01-26 DIAGNOSIS — I2510 Atherosclerotic heart disease of native coronary artery without angina pectoris: Secondary | ICD-10-CM | POA: Diagnosis not present

## 2016-01-26 NOTE — Patient Instructions (Signed)
Home BP measurements look good - continue writing these down and bringing the readings to your visit, also bring the blood pressure cuff to your doctor visits with me.

## 2016-01-26 NOTE — Progress Notes (Signed)
HPI: Ryan Bernard is a 82 y.o. male  who presents to [facility] [place] today, 01/26/16,  for chief complaint of:  Chief Complaint  Patient presents with  . Follow-up    Blood pressure    Hypertension: Very pleasant patient here to have home blood pressure cuff verified. He brings home blood pressure readings with him as well, written down. Typically in 120s over 80s or 90s, occasionally will get up into the 160s. Patient denies chest pain, pressure, shortness of breath.    Past medical, surgical, social and family history reviewed: Patient Active Problem List   Diagnosis Date Noted  . Type 2 diabetes mellitus with diabetic chronic kidney disease (HCC) 09/19/2014  . Postherpetic neuralgia 06/10/2013  . Chronic kidney disease, stage 3 05/06/2012  . Hyperlipidemia LDL goal < 70 05/05/2012  . S/P CABG x 5 05/05/2012  . MICROALBUMINURIA 08/07/2007  . PULMONARY HYPERTENSION 04/07/2007  . Essential hypertension, benign 03/13/2007  . Coronary atherosclerosis 03/13/2007  . SOLAR KERATOSIS 03/13/2007  . BENIGN PROSTATIC HYPERTROPHY, HX OF 03/13/2007   Past Surgical History:  Procedure Laterality Date  . ANGIOPLASTY  1995   [facility]  . BACK SURGERY  1964  . CORONARY ARTERY BYPASS GRAFT  10/27/2009   5 vessel, Dr. Raoul PitchJoel Morgan  . HERNIA REPAIR     x 2    Social History  Substance Use Topics  . Smoking status: Never Smoker  . Smokeless tobacco: Never Used  . Alcohol use No   Family History  Problem Relation Age of Onset  . Heart disease Mother   . Hypertension Mother   . Alcohol abuse Father   . Depression Daughter   . Depression Son      Current medication list and allergy/intolerance information reviewed:   Current Outpatient Prescriptions on File Prior to Visit  Medication Sig Dispense Refill  . aspirin 325 MG tablet Take 325 mg by mouth daily.      . benazepril (LOTENSIN) 40 MG tablet Take 40 mg by mouth daily.      . metoprolol  succinate (TOPROL-XL) 50 MG 24 hr tablet Take 1 tablet (50 mg total) by mouth daily. Take with or immediately following a meal. 90 tablet 3  . pravastatin (PRAVACHOL) 80 MG tablet Take 1 tablet (80 mg total) by mouth at bedtime. 90 tablet 3  . terazosin (HYTRIN) 10 MG capsule Take 10 mg by mouth daily.       No current facility-administered medications on file prior to visit.    No Known Allergies    Review of Systems:  Constitutional: No recent illness  HEENT: No  headache, no vision change  Cardiac: No  chest pain, No  pressure, No palpitations  Respiratory:  No  shortness of breath. No  Cough  Gastrointestinal: No  abdominal pain, no change on bowel habits  Musculoskeletal: No new myalgia/arthralgia  Skin: No  Rash  Hem/Onc: No  easy bruising/bleeding, No  abnormal lumps/bumps  Neurologic: No  weakness, No  Dizziness  Psychiatric: No  concerns with depression, No  concerns with anxiety  Exam:  BP (!) 155/65   Pulse 62   Ht 5\' 7"  (1.702 m)   Wt 155 lb (70.3 kg)   BMI 24.28 kg/m  home blood pressure cuff verified by Dr. Lyn HollingsheadAlexander, his cough and my manual read our systolic 180s  Constitutional: VS see above. General Appearance: alert, well-developed, well-nourished, NAD  Eyes: Normal lids and conjunctive, non-icteric sclera  Ears, Nose, Mouth,  Throat: MMM, Normal external inspection ears/nares/mouth/lips/gums.  Neck: No masses, trachea midline.   Respiratory: Normal respiratory effort. no wheeze, no rhonchi, no rales  Cardiovascular: S1/S2 normal, RRR.   Musculoskeletal: Gait normal. Symmetric and independent movement of all extremities  Neurological: Normal balance/coordination. No tremor.  Skin: warm, dry, intact.   Psychiatric: Normal judgment/insight. Normal mood and affect. Oriented x3.      ASSESSMENT/PLAN:   Okay to continue home blood pressure readings, patient advised to bring cuff and home measurement to all visits. We'll not adjust blood  pressure medications at this time - blood pressure fluctuates but worry about dropping too low. Patient overall feeling well, advised on symptoms of low and high blood pressure to watch out for.  Essential hypertension, benign  Atherosclerosis of native coronary artery of native heart without angina pectoris    Patient Instructions  Home BP measurements look good - continue writing these down and bringing the readings to your visit, also bring the blood pressure cuff to your doctor visits with me.     Visit summary with medication list and pertinent instructions was printed for patient to review. All questions at time of visit were answered - patient instructed to contact office with any additional concerns. ER/RTC precautions were reviewed with the patient. Follow-up plan: Return in about 3 months (around 04/27/2016) for ANNUAL PHYSICAL - sooner if needed.

## 2016-04-19 DIAGNOSIS — H527 Unspecified disorder of refraction: Secondary | ICD-10-CM | POA: Diagnosis not present

## 2016-04-26 ENCOUNTER — Encounter: Payer: Self-pay | Admitting: Osteopathic Medicine

## 2016-04-26 ENCOUNTER — Ambulatory Visit (INDEPENDENT_AMBULATORY_CARE_PROVIDER_SITE_OTHER): Payer: Medicare Other | Admitting: Osteopathic Medicine

## 2016-04-26 VITALS — BP 182/88 | HR 59 | Ht 67.5 in | Wt 155.0 lb

## 2016-04-26 DIAGNOSIS — B0229 Other postherpetic nervous system involvement: Secondary | ICD-10-CM

## 2016-04-26 DIAGNOSIS — E119 Type 2 diabetes mellitus without complications: Secondary | ICD-10-CM

## 2016-04-26 DIAGNOSIS — R011 Cardiac murmur, unspecified: Secondary | ICD-10-CM

## 2016-04-26 DIAGNOSIS — Z951 Presence of aortocoronary bypass graft: Secondary | ICD-10-CM

## 2016-04-26 DIAGNOSIS — I2510 Atherosclerotic heart disease of native coronary artery without angina pectoris: Secondary | ICD-10-CM

## 2016-04-26 DIAGNOSIS — I10 Essential (primary) hypertension: Secondary | ICD-10-CM

## 2016-04-26 DIAGNOSIS — Z0000 Encounter for general adult medical examination without abnormal findings: Secondary | ICD-10-CM | POA: Diagnosis not present

## 2016-04-26 LAB — POCT GLYCOSYLATED HEMOGLOBIN (HGB A1C): Hemoglobin A1C: 6.2

## 2016-04-26 NOTE — Progress Notes (Signed)
HPI: Ryan Bernard is a 83 y.o. male  who presents to [facility] today, 04/26/16,  For...  Medicare Annual Wellness Exam  HbA1C check  BP check  Patient presents for annual physical/Medicare wellness exam. No complaints today.  Glucose: From my review of the records, patient's A1c's have been consistently in the prediabetic range. Not sure where this diagnosis of diabetes is coming from if he had this prior to coming to our office. On no medications, doing well as far as A1c's are concerned over the past few years.  Blood pressure: Elevated in the office, home blood pressure cuff was verified today. Patient's home blood pressure readings are a bit labile, often in the 120s to 140s, occasionally into the 170s systolic. No chest pain, pressure, shortness of breath.  Shingles vaccine discussed, patient states that he was told he should not get this at some point, needed to wait because he had an active shingles infection. Still suffers from some postherpetic neuralgia but is not interested in medications for this, doesn't really bother him enough for that   Past medical, surgical, social and family history reviewed:  Patient Active Problem List   Diagnosis Date Noted  . Type 2 diabetes mellitus with diabetic chronic kidney disease (HCC) 09/19/2014  . Postherpetic neuralgia 06/10/2013  . Chronic kidney disease, stage 3 05/06/2012  . Hyperlipidemia LDL goal < 70 05/05/2012  . S/P CABG x 5 05/05/2012  . MICROALBUMINURIA 08/07/2007  . PULMONARY HYPERTENSION 04/07/2007  . Essential hypertension, benign 03/13/2007  . Coronary atherosclerosis 03/13/2007  . SOLAR KERATOSIS 03/13/2007  . BENIGN PROSTATIC HYPERTROPHY, HX OF 03/13/2007    Past Surgical History:  Procedure Laterality Date  . ANGIOPLASTY  1995   [facility]  . BACK SURGERY  1964  . CORONARY ARTERY BYPASS GRAFT  10/27/2009   5 vessel, Dr. Raoul PitchJoel Morgan  . HERNIA REPAIR     x 2      Social History   Social History  . Marital status: Married    Spouse name: N/A  . Number of children: N/A  . Years of education: N/A   Occupational History  . Not on file.   Social History Main Topics  . Smoking status: Never Smoker  . Smokeless tobacco: Never Used  . Alcohol use No  . Drug use: No  . Sexual activity: Not on file   Other Topics Concern  . Not on file   Social History Narrative  . No narrative on file    Family History  Problem Relation Age of Onset  . Heart disease Mother   . Hypertension Mother   . Alcohol abuse Father   . Depression Daughter   . Depression Son      Current medication list and allergy/intolerance information reviewed:    Outpatient Encounter Prescriptions as of 04/26/2016  Medication Sig  . aspirin 325 MG tablet Take 325 mg by mouth daily.    . benazepril (LOTENSIN) 40 MG tablet Take 40 mg by mouth daily.    . metoprolol succinate (TOPROL-XL) 50 MG 24 hr tablet Take 1 tablet (50 mg total) by mouth daily. Take with or immediately following a meal.  . pravastatin (PRAVACHOL) 80 MG tablet Take 1 tablet (80 mg total) by mouth at bedtime.  Marland Kitchen. terazosin (HYTRIN) 10 MG capsule Take 10 mg by mouth daily.     No facility-administered encounter medications on file as of 04/26/2016.     No Known Allergies  Review of Systems: Review of Systems - negative   Medicare Wellness Questionnaire  Are there smokers in your home (other than you)? no  Depression Screen (Note: if answer to either of the following is "Yes", a more complete depression screening is indicated)   Q1: Over the past two weeks, have you felt down, depressed or hopeless? no  Q2: Over the past two weeks, have you felt little interest or pleasure in doing things? no  Have you lost interest or pleasure in daily life? no  Do you often feel hopeless? no  Do you cry easily over simple problems? no  Activities of Daily Living In your present state of health, do  you have any difficulty performing the following activities?:  Driving? no Managing money?  no Feeding yourself? no Getting from bed to chair? no Climbing a flight of stairs? no Preparing food and eating?: no Bathing or showering? no Getting dressed: no Getting to the toilet? no Using the toilet: no Moving around from place to place: no In the past year have you fallen or had a near fall?: no  Hearing Difficulties:  Do you often ask people to speak up or repeat themselves? no Do you experience ringing or noises in your ears? yes  Do you have difficulty understanding soft or whispered voices? no  Memory Difficulties:  Do you feel that you have a problem with memory? no  Do you often misplace items? no  Do you feel safe at home?  no  Sexual Health:   Are you sexually active?  Yes  Do you have more than one partner?  No  Advanced Directives:   Advanced directives discussed: has an advanced directive - a copy HAS NOT been provided.  Additional information provided: no  Risk Factors  Current exercise habits: exercise bike   Dietary issues discussed: low carb, heart healthy  Cardiac risk factors: known cardiac disease, Diabetes Mellitus, hypertension, hypercholesterolemia/hyperlipidemia   Exam:  BP (!) 182/88   Pulse (!) 59   Ht 5' 7.5" (1.715 m)   Wt 155 lb (70.3 kg)   BMI 23.92 kg/m  Vision by Snellen chart: right eye:see nurse notes, left eye:see nurse notes  Constitutional: VS see above. General Appearance: alert, well-developed, well-nourished, NAD  Ears, Nose, Mouth, Throat: MMM  Neck: No masses, trachea midline.   Respiratory: Normal respiratory effort. no wheeze, no rhonchi, no rales  Cardiovascular:No lower extremity edema. RRR, S1S2 normal, (+)3/6 systolic murmur  Musculoskeletal: Gait normal. No clubbing/cyanosis of digits.   Neurological: Normal balance/coordination. No tremor. Recalls 3 objects and able to read face of watch with correct time.    Skin: warm, dry, intact. No rash/ulcer.   Psychiatric: Normal judgment/insight. Normal mood and affect. Oriented x3.     ASSESSMENT/PLAN:    Medicare annual wellness visit, subsequent  Diabetes mellitus without complication (HCC) - Plan: POCT HgB A1C  Essential hypertension, benign  Atherosclerosis of native coronary artery of native heart without angina pectoris  Postherpetic neuralgia  S/P CABG x 5  Heart murmur - Plan: ECHOCARDIOGRAM COMPLETE  CANCER SCREENING  Lung - does not need  Colon - does not need  Prostate - does not need OTHER DISEASE SCREENING  Lipid - does not need  DM2 - does not need  AAA - male 33-75yo ever smoked - does not need INFECTIOUS DISEASE SCREENING  HIV - does not need  GC/CT - does not need  HepC -does not need  TB - does not need ADULT VACCINATION  Influenza - annual vaccine recommended  Td - booster every 10 years - does not need  Zoster - option at 78, yes at 74+ - needs, patient states he will see if he can get this for free through the [place]  PCV13 - does not need  PPSV23 - does not need Immunization History  Administered Date(s) Administered  . Influenza Split 12/24/2011  . Influenza Whole 01/09/2009, 12/20/2009, 12/28/2010  . Influenza, High Dose Seasonal PF 11/02/2015  . Influenza,inj,Quad PF,36+ Mos 11/06/2012, 12/14/2013, 01/10/2015  . Pneumococcal Conjugate-13 03/16/2014  . Pneumococcal Polysaccharide-23 08/20/2011  . Td 12/10/2002  . Tdap 10/10/2015   OTHER  Fall - exercise and Vit D age 61+ - does not need  Advanced Directives -  Discussed as above   During the course of the visit the patient was educated and counseled about appropriate screening and preventive services as noted above.   Patient Instructions (the written plan) was given to the patient.  Medicare Attestation I have personally reviewed: The patient's medical and social history Their use of alcohol, tobacco or illicit drugs Their  current medications and supplements The patient's functional ability including ADLs,fall risks, home safety risks, cognitive, and hearing and visual impairment Diet and physical activities Evidence for depression or mood disorders  The patient's weight, height, BMI, and visual acuity have been recorded in the chart.  I have made referrals, counseling, and provided education to the patient based on review of the above and I have provided the patient with a written personalized care plan for preventive services.     Sunnie Nielsen, DO   04/26/16   Visit summary with medication list and pertinent instructions was printed for patient to review. All questions at time of visit were answered - patient instructed to contact office with any additional concerns. ER/RTC precautions were reviewed with the patient. Follow-up plan: Return in about 6 months (around 10/24/2016) for labs, routine check-up. Sooner if needed/ based on echocardiogram results .

## 2016-07-15 DIAGNOSIS — Z951 Presence of aortocoronary bypass graft: Secondary | ICD-10-CM | POA: Diagnosis not present

## 2016-07-15 DIAGNOSIS — I2510 Atherosclerotic heart disease of native coronary artery without angina pectoris: Secondary | ICD-10-CM | POA: Diagnosis not present

## 2016-07-15 DIAGNOSIS — I10 Essential (primary) hypertension: Secondary | ICD-10-CM | POA: Diagnosis not present

## 2016-07-15 DIAGNOSIS — R011 Cardiac murmur, unspecified: Secondary | ICD-10-CM | POA: Diagnosis not present

## 2016-08-29 DIAGNOSIS — I5189 Other ill-defined heart diseases: Secondary | ICD-10-CM | POA: Diagnosis not present

## 2016-08-29 DIAGNOSIS — I517 Cardiomegaly: Secondary | ICD-10-CM | POA: Diagnosis not present

## 2016-08-29 DIAGNOSIS — I088 Other rheumatic multiple valve diseases: Secondary | ICD-10-CM | POA: Diagnosis not present

## 2016-10-24 ENCOUNTER — Ambulatory Visit: Payer: Medicare Other | Admitting: Osteopathic Medicine

## 2016-10-28 ENCOUNTER — Ambulatory Visit: Payer: Medicare Other | Admitting: Osteopathic Medicine

## 2016-10-30 ENCOUNTER — Ambulatory Visit (INDEPENDENT_AMBULATORY_CARE_PROVIDER_SITE_OTHER): Payer: Medicare Other | Admitting: Osteopathic Medicine

## 2016-10-30 ENCOUNTER — Encounter: Payer: Self-pay | Admitting: Osteopathic Medicine

## 2016-10-30 VITALS — BP 175/91 | HR 53 | Ht 67.5 in | Wt 149.0 lb

## 2016-10-30 DIAGNOSIS — I10 Essential (primary) hypertension: Secondary | ICD-10-CM | POA: Diagnosis not present

## 2016-10-30 DIAGNOSIS — Z23 Encounter for immunization: Secondary | ICD-10-CM | POA: Diagnosis not present

## 2016-10-30 DIAGNOSIS — R7303 Prediabetes: Secondary | ICD-10-CM

## 2016-10-30 LAB — POCT GLYCOSYLATED HEMOGLOBIN (HGB A1C): HEMOGLOBIN A1C: 6.1

## 2016-10-30 NOTE — Progress Notes (Signed)
HPI: Ryan Bernard is a 83 y.o. male  who presents to [facility] today, 10/30/16,  for chief complaint of:  Chief Complaint  Patient presents with  . Follow-up    HTN: home BP readings (monitor verified 04/2016) are in systolic 120s typically, occasionally into 140s but also occasionally into 100s. Labile BP with dizziness when BP is low, but high BP in the office on most occasions. He forgot to bring his home BP cuff today to this appt.   Prediabetes: A1C in good range. No meds.     Past medical history, surgical history, social history and family history reviewed.  Patient Active Problem List   Diagnosis Date Noted  . Type 2 diabetes mellitus with diabetic chronic kidney disease (HCC) 09/19/2014  . Postherpetic neuralgia 06/10/2013  . Chronic kidney disease, stage 3 05/06/2012  . Hyperlipidemia LDL goal < 70 05/05/2012  . S/P CABG x 5 05/05/2012  . MICROALBUMINURIA 08/07/2007  . PULMONARY HYPERTENSION 04/07/2007  . Essential hypertension, benign 03/13/2007  . Coronary atherosclerosis 03/13/2007  . SOLAR KERATOSIS 03/13/2007  . BENIGN PROSTATIC HYPERTROPHY, HX OF 03/13/2007    Current medication list and allergy/intolerance information reviewed.   Current Outpatient Prescriptions on File Prior to Visit  Medication Sig Dispense Refill  . aspirin 81 MG EC tablet Take 81 mg by mouth daily.     . benazepril (LOTENSIN) 40 MG tablet Take 40 mg by mouth daily.      . metoprolol succinate (TOPROL-XL) 50 MG 24 hr tablet Take 1 tablet (50 mg total) by mouth daily. Take with or immediately following a meal. 90 tablet 3  . pravastatin (PRAVACHOL) 80 MG tablet Take 1 tablet (80 mg total) by mouth at bedtime. 90 tablet 3  . terazosin (HYTRIN) 10 MG capsule Take 10 mg by mouth daily.       No current facility-administered medications on file prior to visit.    No Known Allergies    Review of Systems:  Constitutional: No recent illness, feels well  today  Cardiac: No  chest pain, No  pressure, No palpitations  Respiratory:  No  shortness of breath. No  Cough  Gastrointestinal: No  abdominal pain,  Musculoskeletal: No new myalgia/arthralgia  Skin: No  Rash  Neurologic: No  weakness, No  Dizziness  Psychiatric: No  concerns with depression, No  concerns with anxiety  Exam:  BP (!) 175/91   Pulse (!) 53   Ht 5' 7.5" (1.715 m)   Wt 149 lb (67.6 kg)   BMI 22.99 kg/m   Constitutional: VS see above. General Appearance: alert, well-developed, well-nourished, NAD  Eyes: Normal lids and conjunctive, non-icteric sclera  Ears, Nose, Mouth, Throat: MMM, Normal external inspection ears/nares/mouth/lips/gums.  Neck: No masses, trachea midline.   Respiratory: Normal respiratory effort. no wheeze, no rhonchi, no rales  Cardiovascular: S1/S2 normal, +murmur, no rub/gallop auscultated. RRR.   Musculoskeletal: Gait normal. Symmetric and independent movement of all extremities  Neurological: Normal balance/coordination. No tremor.  Skin: warm, dry, intact.   Psychiatric: Normal judgment/insight. Normal mood and affect. Oriented x3.    Recent Results (from the past 2160 hour(s))  POCT HgB A1C     Status: None   Collection Time: 10/30/16 10:45 AM  Result Value Ref Range   Hemoglobin A1C 6.1       ASSESSMENT/PLAN:   White coat syndrome with diagnosis of hypertension - Will hold off on medication adjustment given normal to low BP on verified home monitor  Prediabetes - Plan: POCT HgB A1C  Need for influenza vaccination - Plan: Flu vaccine HIGH DOSE PF   Labs next visit - pt advised to come fasting    Follow-up plan: Return in about 3 months (around 01/30/2017) for blood pressure check - bring home monitor! .  Visit summary with medication list and pertinent instructions was printed for patient to review, alert [place] if any changes needed. All questions at time of visit were answered - patient instructed to contact office  with any additional concerns. ER/RTC precautions were reviewed with the patient and understanding verbalized.

## 2017-01-27 ENCOUNTER — Ambulatory Visit: Payer: Medicare Other | Admitting: Osteopathic Medicine

## 2017-01-27 ENCOUNTER — Encounter: Payer: Self-pay | Admitting: Osteopathic Medicine

## 2017-01-27 VITALS — BP 170/81 | HR 59 | Temp 97.8°F | Wt 152.4 lb

## 2017-01-27 DIAGNOSIS — I10 Essential (primary) hypertension: Secondary | ICD-10-CM

## 2017-01-27 DIAGNOSIS — E119 Type 2 diabetes mellitus without complications: Secondary | ICD-10-CM | POA: Diagnosis not present

## 2017-01-27 DIAGNOSIS — Z951 Presence of aortocoronary bypass graft: Secondary | ICD-10-CM | POA: Diagnosis not present

## 2017-01-27 NOTE — Progress Notes (Signed)
HPI: Ryan Bernard is a 83 y.o. male who  has a past medical history of Anemia, BPH (benign prostatic hyperplasia), CAD (coronary artery disease) (1995), Diabetes mellitus (2004), Heart disease, Hyperlipidemia, Hypertension, and SBE (subacute bacterial endocarditis) prophylaxis candidate.  he presents to [facility] today, 01/27/17,  for chief complaint of:  Chief Complaint  Patient presents with  . Follow-up    Hypertension    Home BP: 165/96 Manual cuff: 170/100  Home readings are looking about 170/80 and in afternoon 130/60s - per patient verbal report, he does not have a written record with him  No chest pain, pressure, shortness of breath. No dizziness, faintness, LOC.    Past medical, surgical, social and family history reviewed:  Patient Active Problem List   Diagnosis Date Noted  . Type 2 diabetes mellitus with diabetic chronic kidney disease (HCC) 09/19/2014  . Postherpetic neuralgia 06/10/2013  . Chronic kidney disease, stage 3 (HCC) 05/06/2012  . Hyperlipidemia LDL goal < 70 05/05/2012  . S/P CABG x 5 05/05/2012  . MICROALBUMINURIA 08/07/2007  . PULMONARY HYPERTENSION 04/07/2007  . Essential hypertension, benign 03/13/2007  . Coronary atherosclerosis 03/13/2007  . SOLAR KERATOSIS 03/13/2007  . BENIGN PROSTATIC HYPERTROPHY, HX OF 03/13/2007    Past Surgical History:  Procedure Laterality Date  . ANGIOPLASTY  1995   [facility]  . BACK SURGERY  1964  . CORONARY ARTERY BYPASS GRAFT  10/27/2009   5 vessel, Dr. Raoul PitchJoel Morgan  . HERNIA REPAIR     x 2     Social History   Tobacco Use  . Smoking status: Never Smoker  . Smokeless tobacco: Never Used  Substance Use Topics  . Alcohol use: No    Family History  Problem Relation Age of Onset  . Heart disease Mother   . Hypertension Mother   . Alcohol abuse Father   . Depression Daughter   . Depression Son      Current medication list and allergy/intolerance information  reviewed:    Current Outpatient Medications  Medication Sig Dispense Refill  . aspirin 81 MG EC tablet Take 81 mg by mouth daily.     . benazepril (LOTENSIN) 40 MG tablet Take 40 mg by mouth daily.      . metoprolol succinate (TOPROL-XL) 50 MG 24 hr tablet Take 1 tablet (50 mg total) by mouth daily. Take with or immediately following a meal. 90 tablet 3  . pravastatin (PRAVACHOL) 80 MG tablet Take 1 tablet (80 mg total) by mouth at bedtime. 90 tablet 3  . terazosin (HYTRIN) 10 MG capsule Take 10 mg by mouth daily.       No current facility-administered medications for this visit.     No Known Allergies    Review of Systems:  Constitutional:  No  fever, no chills, No recent illness,    HEENT: No  headache, no vision change  Cardiac: No  chest pain, No  pressure, No palpitations,  Respiratory:  No  shortness of breath. No  Cough  Gastrointestinal: No  abdominal pain, No  nausea,    Musculoskeletal: No new myalgia/arthralgia  Neurologic: No  weakness, No  dizziness   Exam:  BP (!) 170/81 (BP Location: Right Arm, Patient Position: Sitting, Cuff Size: Large)   Pulse (!) 59   Temp 97.8 F (36.6 C) (Oral)   Wt 152 lb 6.4 oz (69.1 kg)   BMI 23.52 kg/m   Constitutional: VS see above. General Appearance: alert, well-developed, well-nourished, NAD  Eyes: Normal lids and conjunctive, non-icteric sclera  Ears, Nose, Mouth, Throat: MMM, Normal external inspection ears/nares/mouth/lips/gums.   Neck: No masses, trachea midline.  Respiratory: Normal respiratory effort. no wheeze, no rhonchi, no rales  Cardiovascular: S1/S2 normal, +murmur, no rub/gallop auscultated. RRR. No lower extremity edema  Musculoskeletal: Gait normal.   Neurological: Normal balance/coordination. No tremor  Skin: warm, dry, intact. No rash/ulcer.   Psychiatric: Normal judgment/insight. Normal mood and affect. Oriented x3.    ASSESSMENT/PLAN: Somewhat labile blood pressure per patient report, home  blood pressure cuff is verified to within a reasonable degree of our equipment. Patient would like to avoid medication adjustment at this point, advised of risk of high blood pressure in terms of cardiovascular events, must balance this against the risk of hypotensive if we adjust medications to aggressively. Patient okay to leave meds as they are  Essential hypertension, benign - Plan: CBC, COMPLETE METABOLIC PANEL WITH GFR, Lipid panel  S/P CABG x 5 - Plan: CBC, COMPLETE METABOLIC PANEL WITH GFR, Lipid panel  Diabetes mellitus without complication (HCC) - Plan: CBC, COMPLETE METABOLIC PANEL WITH GFR, Lipid panel, Hemoglobin A1c  White coat syndrome with diagnosis of hypertension    Patient Instructions  Metoprolol whole tablet in the evenings or mornings with food  Benazepril whole tablet in the mornings   Next time you're here, bring all your pill bottles with you and bing your blood pressure cuff, too.      Visit summary with medication list and pertinent instructions was printed for patient to review. All questions at time of visit were answered - patient instructed to contact office with any additional concerns. ER/RTC precautions were reviewed with the patient. Follow-up plan: Return in about 4 months (around 05/27/2017) for recheck blood pressure, annual physical .  Note: Total time spent 25 minutes, greater than 50% of the visit was spent face-to-face counseling and coordinating care for the following: The primary encounter diagnosis was Essential hypertension, benign. Diagnoses of S/P CABG x 5, Diabetes mellitus without complication (HCC), and White coat syndrome with diagnosis of hypertension were also pertinent to this visit.Marland Kitchen.  Please note: voice recognition software was used to produce this document, and typos may escape review. Please contact Dr. Lyn HollingsheadAlexander for any needed clarifications.

## 2017-01-27 NOTE — Patient Instructions (Signed)
Metoprolol whole tablet in the evenings or mornings with food  Benazepril whole tablet in the mornings   Next time you're here, bring all your pill bottles with you and bing your blood pressure cuff, too.

## 2017-01-28 LAB — CBC
HCT: 39 % (ref 38.5–50.0)
HEMOGLOBIN: 13.3 g/dL (ref 13.2–17.1)
MCH: 31.5 pg (ref 27.0–33.0)
MCHC: 34.1 g/dL (ref 32.0–36.0)
MCV: 92.4 fL (ref 80.0–100.0)
MPV: 11.1 fL (ref 7.5–12.5)
Platelets: 129 10*3/uL — ABNORMAL LOW (ref 140–400)
RBC: 4.22 10*6/uL (ref 4.20–5.80)
RDW: 12.7 % (ref 11.0–15.0)
WBC: 5.3 10*3/uL (ref 3.8–10.8)

## 2017-01-28 LAB — COMPLETE METABOLIC PANEL WITH GFR
AG RATIO: 1.5 (calc) (ref 1.0–2.5)
ALBUMIN MSPROF: 4.2 g/dL (ref 3.6–5.1)
ALKALINE PHOSPHATASE (APISO): 94 U/L (ref 40–115)
ALT: 10 U/L (ref 9–46)
AST: 15 U/L (ref 10–35)
BILIRUBIN TOTAL: 0.9 mg/dL (ref 0.2–1.2)
BUN / CREAT RATIO: 12 (calc) (ref 6–22)
BUN: 14 mg/dL (ref 7–25)
CHLORIDE: 101 mmol/L (ref 98–110)
CO2: 31 mmol/L (ref 20–32)
Calcium: 9.2 mg/dL (ref 8.6–10.3)
Creat: 1.16 mg/dL — ABNORMAL HIGH (ref 0.70–1.11)
GFR, EST AFRICAN AMERICAN: 67 mL/min/{1.73_m2} (ref >=60)
GFR, Est Non African American: 58 mL/min/{1.73_m2} — ABNORMAL LOW (ref >=60)
GLUCOSE: 113 mg/dL — AB (ref 65–99)
Globulin: 2.8 g/dL (calc) (ref 1.9–3.7)
POTASSIUM: 4.5 mmol/L (ref 3.5–5.3)
SODIUM: 139 mmol/L (ref 135–146)
TOTAL PROTEIN: 7 g/dL (ref 6.1–8.1)

## 2017-01-28 LAB — LIPID PANEL
CHOLESTEROL: 155 mg/dL (ref <200)
HDL: 66 mg/dL (ref >40)
LDL CHOLESTEROL (CALC): 74 mg/dL
Non-HDL Cholesterol (Calc): 89 mg/dL (calc) (ref <130)
Total CHOL/HDL Ratio: 2.3 (calc) (ref <5.0)
Triglycerides: 70 mg/dL (ref <150)

## 2017-01-28 LAB — HEMOGLOBIN A1C
EAG (MMOL/L): 6.5 (calc)
Hgb A1c MFr Bld: 5.7 % of total Hgb — ABNORMAL HIGH (ref <5.7)
MEAN PLASMA GLUCOSE: 117 (calc)

## 2017-04-25 DIAGNOSIS — H527 Unspecified disorder of refraction: Secondary | ICD-10-CM | POA: Diagnosis not present

## 2017-04-25 LAB — HM DIABETES EYE EXAM

## 2017-05-22 ENCOUNTER — Encounter: Payer: Self-pay | Admitting: Osteopathic Medicine

## 2017-05-22 ENCOUNTER — Ambulatory Visit (INDEPENDENT_AMBULATORY_CARE_PROVIDER_SITE_OTHER): Payer: Medicare Other | Admitting: Osteopathic Medicine

## 2017-05-22 ENCOUNTER — Ambulatory Visit (INDEPENDENT_AMBULATORY_CARE_PROVIDER_SITE_OTHER): Payer: Medicare Other

## 2017-05-22 VITALS — BP 136/66 | HR 61 | Temp 98.0°F | Wt 157.0 lb

## 2017-05-22 DIAGNOSIS — Z951 Presence of aortocoronary bypass graft: Secondary | ICD-10-CM | POA: Diagnosis not present

## 2017-05-22 DIAGNOSIS — I700 Atherosclerosis of aorta: Secondary | ICD-10-CM

## 2017-05-22 DIAGNOSIS — R05 Cough: Secondary | ICD-10-CM

## 2017-05-22 DIAGNOSIS — J029 Acute pharyngitis, unspecified: Secondary | ICD-10-CM

## 2017-05-22 DIAGNOSIS — R059 Cough, unspecified: Secondary | ICD-10-CM

## 2017-05-22 DIAGNOSIS — R69 Illness, unspecified: Secondary | ICD-10-CM

## 2017-05-22 DIAGNOSIS — J90 Pleural effusion, not elsewhere classified: Secondary | ICD-10-CM | POA: Diagnosis not present

## 2017-05-22 DIAGNOSIS — J111 Influenza due to unidentified influenza virus with other respiratory manifestations: Secondary | ICD-10-CM

## 2017-05-22 LAB — POCT INFLUENZA A/B
INFLUENZA A, POC: NEGATIVE
Influenza B, POC: NEGATIVE

## 2017-05-22 MED ORDER — ALBUTEROL SULFATE HFA 108 (90 BASE) MCG/ACT IN AERS: 2.0000 | INHALATION_SPRAY | Freq: Four times a day (QID) | RESPIRATORY_TRACT | 11 refills | Status: DC | PRN

## 2017-05-22 MED ORDER — GUAIFENESIN-CODEINE 100-10 MG/5ML PO SOLN: 5.0000 mL | Freq: Four times a day (QID) | ORAL | 0 refills | Status: DC | PRN

## 2017-05-22 MED ORDER — AZITHROMYCIN 250 MG PO TABS: ORAL_TABLET | ORAL | 0 refills | Status: DC

## 2017-05-22 NOTE — Patient Instructions (Signed)
Over-the-Counter Medications & Home Remedies for Upper Respiratory Illness  Note: the following list assumes no pregnancy, normal liver & kidney function and no other drug interactions. Dr. Lyn HollingsheadAlexander has highlighted medications which are safe for you to use, but these may not be appropriate for everyone. Always ask a pharmacist or qualified medical provider if you have any questions!   Aches/Pains, Fever, Headache Acetaminophen (Tylenol) 500 mg tablets - take max 1 tablets (500mg ) every 6 hours (4 times per day)  Ibuprofen (Motrin) 200 mg tablets - take max 2 tablets (400mg ) every 6 hours*  Sinus Congestion Nasal Saline if desired Phenylephrine (Sudafed) 10 mg tablets every 4 hours (or the 12-hour formulation)* Diphenhydramine (Benadryl) 25 mg tablets - take 1 tablets every 4 hours  Cough & Sore Throat Prescription cough pills or syrups as directed Dextromethorphan (Robitussin, others) - cough suppressant Guaifenesin (Robitussin, Mucinex, others) - expectorant (helps cough up mucus) (Dextromethorphan and Guaifenesin also come in a combination tablet) Lozenges w/ Benzocaine + Menthol (Cepacol) Honey - as much as you want! Teas which "coat the throat" - look for ingredients Elm Bark, Licorice Root, Marshmallow Root  Other Antibiotics if these are needed - take ALL, even if you're feeling better  Zinc Lozenges within 24 hours of symptoms onset - mixed evidence this shortens the duration of the common cold Don't waste your money on Vitamin C or Echinacea  *Caution in patients with high blood pressure

## 2017-05-22 NOTE — Progress Notes (Signed)
HPI: Ryan Bernard is a 84 y.o. male who  has a past medical history of Anemia, BPH (benign prostatic hyperplasia), CAD (coronary artery disease) (1995), Diabetes mellitus (2004), Heart disease, Hyperlipidemia, Hypertension, and SBE (subacute bacterial endocarditis) prophylaxis candidate.  he presents to [facility] today, 05/22/17,  for chief complaint of: Flu-like illness  Started feeling sick last night, Coughing, low fever. Coughing worse with lying down, whistling type noise concern for wheezing. Had flu shot earlier this season.    Patient is accompanied by son who assists with history-taking. Sent states he recently tested positive for flu.  Past medical, surgical, social and family history reviewed:  Patient Active Problem List   Diagnosis Date Noted  . Type 2 diabetes mellitus with diabetic chronic kidney disease (HCC) 09/19/2014  . Postherpetic neuralgia 06/10/2013  . Chronic kidney disease, stage 3 (HCC) 05/06/2012  . Hyperlipidemia LDL goal < 70 05/05/2012  . S/P CABG x 5 05/05/2012  . MICROALBUMINURIA 08/07/2007  . PULMONARY HYPERTENSION 04/07/2007  . Essential hypertension, benign 03/13/2007  . Coronary atherosclerosis 03/13/2007  . SOLAR KERATOSIS 03/13/2007  . BENIGN PROSTATIC HYPERTROPHY, HX OF 03/13/2007    Past Surgical History:  Procedure Laterality Date  . ANGIOPLASTY  1995   [facility]  . BACK SURGERY  1964  . CORONARY ARTERY BYPASS GRAFT  10/27/2009   5 vessel, Dr. Raoul Pitch  . HERNIA REPAIR     x 2     Social History   Tobacco Use  . Smoking status: Never Smoker  . Smokeless tobacco: Never Used  Substance Use Topics  . Alcohol use: No    Family History  Problem Relation Age of Onset  . Heart disease Mother   . Hypertension Mother   . Alcohol abuse Father   . Depression Daughter   . Depression Son      Current medication list and allergy/intolerance information reviewed:    Current Outpatient  Medications  Medication Sig Dispense Refill  . aspirin 81 MG EC tablet Take 81 mg by mouth daily.     . benazepril (LOTENSIN) 40 MG tablet Take 40 mg by mouth daily.      . metoprolol succinate (TOPROL-XL) 50 MG 24 hr tablet Take 1 tablet (50 mg total) by mouth daily. Take with or immediately following a meal. (Patient taking differently: Take 25 mg 2 (two) times daily by mouth. Take with or immediately following a meal.) 90 tablet 3  . pravastatin (PRAVACHOL) 80 MG tablet Take 1 tablet (80 mg total) by mouth at bedtime. 90 tablet 3  . terazosin (HYTRIN) 10 MG capsule Take 10 mg by mouth daily.       No current facility-administered medications for this visit.     No Known Allergies    Review of Systems:  Constitutional:  +subjective fever, no chills, No unintentional weight changes. No significant fatigue.   HEENT: No  headache, no vision change, no hearing change, +sore throat, No  sinus pressure  Cardiac: No  chest pain, No  pressure, No palpitations  Respiratory:  No  shortness of breath. +Cough  Gastrointestinal: No  abdominal pain, No  nausea, No  vomiting,  No  blood in stool, No  diarrhea  Musculoskeletal: No new myalgia/arthralgia  Skin: No  Rash  Neurologic: No  weakness, No  dizziness  Exam:  BP 136/66   Pulse 61   Temp 98 F (36.7 C) (Oral)   Wt 157 lb 0.6 oz (71.2 kg)  SpO2 95%   BMI 24.23 kg/m   Constitutional: VS see above. General Appearance: alert, well-developed, well-nourished, NAD. Talking in complete sentences,active and able to walk around independently.  Eyes: Normal lids and conjunctive, non-icteric sclera  Ears, Nose, Mouth, Throat: MMM, Normal external inspection ears/nares/mouth/lips/gums. TM normal bilaterally. Pharynx/tonsils no erythema, no exudate. Nasal mucosa normal.   Neck: No masses, trachea midline. No tenderness/mass appreciated. No lymphadenopathy  Respiratory: Normal respiratory effort. no wheeze, no rhonchi, no  rales  Cardiovascular: S1/S2 normal, +murmur, no rub/gallop auscultated. RRR. No lower extremity edema.   Gastrointestinal: Nontender, no masses. Bowel sounds normal.  Musculoskeletal: Gait normal.   Neurological: Normal balance/coordination. No tremor.   Skin: warm, dry, intact.   Psychiatric: Normal judgment/insight. Normal mood and affect.   Results for orders placed or performed in visit on 05/22/17 (from the past 72 hour(s))  POCT Influenza A/B     Status: None   Collection Time: 05/22/17 11:33 AM  Result Value Ref Range   Influenza A, POC Negative Negative   Influenza B, POC Negative Negative    Dg Chest 2 View  Result Date: 05/22/2017 CLINICAL DATA:  84 year old male with cough for 1 week. EXAM: CHEST - 2 VIEW COMPARISON:  None. FINDINGS: Prior CABG. Mild cardiomegaly. Other mediastinal contours are within normal limits. Visualized tracheal air column is within normal limits. Increased basilar predominant pulmonary interstitial opacity in both lungs. More confluent bibasilar opacity which may reflect small pleural effusions. No pneumothorax. No consolidation. No acute osseous abnormality identified. Postoperative changes to the proximal right humerus. Abdomen Calcified aortic atherosclerosis. Negative visible bowel gas pattern. IMPRESSION: 1. Abnormal increased pulmonary interstitial opacity, and more confluent bibasilar opacity. Favor acute pulmonary edema with small pleural effusions. Other differential considerations include viral/atypical respiratory infection with a fusions and chronic lung disease with bibasilar scarring. 2. Prior CABG.  Aortic Atherosclerosis (ICD10-I70.0). Electronically Signed   By: Odessa Fleming M.D.   On: 05/22/2017 14:44     ASSESSMENT/PLAN:   Influenza-like illness - Plan: POCT Influenza A/B  Cough - Plan: guaiFENesin-codeine 100-10 MG/5ML syrup, azithromycin (ZITHROMAX) 250 MG tablet, albuterol (PROVENTIL HFA;VENTOLIN HFA) 108 (90 Base) MCG/ACT  inhaler, DG Chest 2 View  Sore throat   I think more likely viral illness, given borderline chest x-ray would go ahead and start azithromycin sooner than later. No previous chest x-ray available for comparison. On personal review of the x-ray, if the bibasilar infiltrates are effusion these are very mild and I did not hear any rales on exam. No wheezing on exam.Question edema versus bronchitis.  Patient Instructions  Over-the-Counter Medications & Home Remedies for Upper Respiratory Illness  Note: the following list assumes no pregnancy, normal liver & kidney function and no other drug interactions. Dr. Lyn Hollingshead has highlighted medications which are safe for you to use, but these may not be appropriate for everyone. Always ask a pharmacist or qualified medical provider if you have any questions!   Aches/Pains, Fever, Headache Acetaminophen (Tylenol) 500 mg tablets - take max 1 tablets (500mg ) every 6 hours (4 times per day)  Ibuprofen (Motrin) 200 mg tablets - take max 2 tablets (400mg ) every 6 hours*  Sinus Congestion Nasal Saline if desired Phenylephrine (Sudafed) 10 mg tablets every 4 hours (or the 12-hour formulation)* Diphenhydramine (Benadryl) 25 mg tablets - take 1 tablets every 4 hours  Cough & Sore Throat Prescription cough pills or syrups as directed Dextromethorphan (Robitussin, others) - cough suppressant Guaifenesin (Robitussin, Mucinex, others) - expectorant (helps cough up mucus) (Dextromethorphan  and Guaifenesin also come in a combination tablet) Lozenges w/ Benzocaine + Menthol (Cepacol) Honey - as much as you want! Teas which "coat the throat" - look for ingredients Elm Bark, Licorice Root, Marshmallow Root  Other Antibiotics if these are needed - take ALL, even if you're feeling better  Zinc Lozenges within 24 hours of symptoms onset - mixed evidence this shortens the duration of the common cold Don't waste your money on Vitamin C or Echinacea  *Caution in  patients with high blood pressure       Visit summary with medication list and pertinent instructions was printed for patient to review. All questions at time of visit were answered - patient instructed to contact office with any additional concerns. ER/RTC precautions were reviewed with the patient.   Follow-up plan: Return if symptoms worsen or fail to improve.    Please note: voice recognition software was used to produce this document, and typos may escape review. Please contact Dr. Lyn HollingsheadAlexander for any needed clarifications.

## 2017-05-23 ENCOUNTER — Encounter: Payer: Self-pay | Admitting: Osteopathic Medicine

## 2017-05-26 DIAGNOSIS — E876 Hypokalemia: Secondary | ICD-10-CM | POA: Insufficient documentation

## 2017-05-26 DIAGNOSIS — R748 Abnormal levels of other serum enzymes: Secondary | ICD-10-CM | POA: Diagnosis not present

## 2017-05-26 DIAGNOSIS — I10 Essential (primary) hypertension: Secondary | ICD-10-CM | POA: Diagnosis not present

## 2017-05-26 DIAGNOSIS — I110 Hypertensive heart disease with heart failure: Secondary | ICD-10-CM | POA: Diagnosis not present

## 2017-05-26 DIAGNOSIS — Z79899 Other long term (current) drug therapy: Secondary | ICD-10-CM | POA: Diagnosis not present

## 2017-05-26 DIAGNOSIS — Z951 Presence of aortocoronary bypass graft: Secondary | ICD-10-CM | POA: Diagnosis not present

## 2017-05-26 DIAGNOSIS — E119 Type 2 diabetes mellitus without complications: Secondary | ICD-10-CM | POA: Diagnosis not present

## 2017-05-26 DIAGNOSIS — Z7982 Long term (current) use of aspirin: Secondary | ICD-10-CM | POA: Diagnosis not present

## 2017-05-26 DIAGNOSIS — J156 Pneumonia due to other aerobic Gram-negative bacteria: Secondary | ICD-10-CM | POA: Diagnosis not present

## 2017-05-26 DIAGNOSIS — I2510 Atherosclerotic heart disease of native coronary artery without angina pectoris: Secondary | ICD-10-CM | POA: Diagnosis not present

## 2017-05-26 DIAGNOSIS — J1008 Influenza due to other identified influenza virus with other specified pneumonia: Secondary | ICD-10-CM | POA: Diagnosis not present

## 2017-05-26 DIAGNOSIS — R918 Other nonspecific abnormal finding of lung field: Secondary | ICD-10-CM | POA: Diagnosis not present

## 2017-05-26 DIAGNOSIS — J189 Pneumonia, unspecified organism: Secondary | ICD-10-CM | POA: Diagnosis not present

## 2017-05-26 DIAGNOSIS — J111 Influenza due to unidentified influenza virus with other respiratory manifestations: Secondary | ICD-10-CM | POA: Diagnosis not present

## 2017-05-26 DIAGNOSIS — J811 Chronic pulmonary edema: Secondary | ICD-10-CM | POA: Diagnosis not present

## 2017-05-26 DIAGNOSIS — J90 Pleural effusion, not elsewhere classified: Secondary | ICD-10-CM | POA: Diagnosis not present

## 2017-05-26 DIAGNOSIS — E785 Hyperlipidemia, unspecified: Secondary | ICD-10-CM | POA: Diagnosis not present

## 2017-05-26 DIAGNOSIS — J09X1 Influenza due to identified novel influenza A virus with pneumonia: Secondary | ICD-10-CM | POA: Diagnosis not present

## 2017-05-26 DIAGNOSIS — R0602 Shortness of breath: Secondary | ICD-10-CM | POA: Diagnosis not present

## 2017-05-26 DIAGNOSIS — I517 Cardiomegaly: Secondary | ICD-10-CM | POA: Diagnosis not present

## 2017-05-26 DIAGNOSIS — I5023 Acute on chronic systolic (congestive) heart failure: Secondary | ICD-10-CM | POA: Diagnosis not present

## 2017-05-26 DIAGNOSIS — Z91048 Other nonmedicinal substance allergy status: Secondary | ICD-10-CM | POA: Diagnosis not present

## 2017-05-26 DIAGNOSIS — J9601 Acute respiratory failure with hypoxia: Secondary | ICD-10-CM | POA: Diagnosis not present

## 2017-05-26 DIAGNOSIS — I169 Hypertensive crisis, unspecified: Secondary | ICD-10-CM | POA: Diagnosis not present

## 2017-05-27 ENCOUNTER — Encounter: Payer: Medicare Other | Admitting: Osteopathic Medicine

## 2017-05-27 ENCOUNTER — Telehealth: Payer: Self-pay | Admitting: Osteopathic Medicine

## 2017-05-27 DIAGNOSIS — E119 Type 2 diabetes mellitus without complications: Secondary | ICD-10-CM | POA: Diagnosis not present

## 2017-05-27 DIAGNOSIS — R748 Abnormal levels of other serum enzymes: Secondary | ICD-10-CM | POA: Diagnosis not present

## 2017-05-27 DIAGNOSIS — I5023 Acute on chronic systolic (congestive) heart failure: Secondary | ICD-10-CM | POA: Diagnosis not present

## 2017-05-27 DIAGNOSIS — J09X1 Influenza due to identified novel influenza A virus with pneumonia: Secondary | ICD-10-CM | POA: Diagnosis not present

## 2017-05-27 NOTE — Telephone Encounter (Signed)
Pt called to cancel appointment today and wanted me to inform you that [facility] is [facility] with [facility] Flu and pneumonia. Thanks

## 2017-05-27 NOTE — Telephone Encounter (Signed)
Noted. We called him last week w/ concerning XR results and advised take antibiotic. He hung up on us then. See those notes.

## 2017-05-28 DIAGNOSIS — I517 Cardiomegaly: Secondary | ICD-10-CM | POA: Diagnosis not present

## 2017-05-28 DIAGNOSIS — J09X1 Influenza due to identified novel influenza A virus with pneumonia: Secondary | ICD-10-CM | POA: Diagnosis not present

## 2017-05-28 DIAGNOSIS — R918 Other nonspecific abnormal finding of lung field: Secondary | ICD-10-CM | POA: Diagnosis not present

## 2017-05-28 DIAGNOSIS — R0602 Shortness of breath: Secondary | ICD-10-CM | POA: Diagnosis not present

## 2017-05-28 DIAGNOSIS — I5023 Acute on chronic systolic (congestive) heart failure: Secondary | ICD-10-CM | POA: Diagnosis not present

## 2017-05-28 DIAGNOSIS — E119 Type 2 diabetes mellitus without complications: Secondary | ICD-10-CM | POA: Diagnosis not present

## 2017-05-28 DIAGNOSIS — J90 Pleural effusion, not elsewhere classified: Secondary | ICD-10-CM | POA: Diagnosis not present

## 2017-05-28 DIAGNOSIS — R748 Abnormal levels of other serum enzymes: Secondary | ICD-10-CM | POA: Diagnosis not present

## 2017-05-29 DIAGNOSIS — R748 Abnormal levels of other serum enzymes: Secondary | ICD-10-CM | POA: Diagnosis not present

## 2017-05-29 DIAGNOSIS — E119 Type 2 diabetes mellitus without complications: Secondary | ICD-10-CM | POA: Diagnosis not present

## 2017-05-29 DIAGNOSIS — I5023 Acute on chronic systolic (congestive) heart failure: Secondary | ICD-10-CM | POA: Diagnosis not present

## 2017-05-29 DIAGNOSIS — J09X1 Influenza due to identified novel influenza A virus with pneumonia: Secondary | ICD-10-CM | POA: Diagnosis not present

## 2017-05-30 DIAGNOSIS — I5023 Acute on chronic systolic (congestive) heart failure: Secondary | ICD-10-CM | POA: Diagnosis not present

## 2017-05-30 DIAGNOSIS — E119 Type 2 diabetes mellitus without complications: Secondary | ICD-10-CM | POA: Diagnosis not present

## 2017-05-30 DIAGNOSIS — J09X1 Influenza due to identified novel influenza A virus with pneumonia: Secondary | ICD-10-CM | POA: Diagnosis not present

## 2017-05-30 DIAGNOSIS — R748 Abnormal levels of other serum enzymes: Secondary | ICD-10-CM | POA: Diagnosis not present

## 2017-06-01 MED ORDER — ALBUTEROL SULFATE (2.5 MG/3ML) 0.083% IN NEBU
2.50 | INHALATION_SOLUTION | RESPIRATORY_TRACT | Status: DC
Start: ? — End: 2017-06-01

## 2017-06-01 MED ORDER — HYDRALAZINE HCL 20 MG/ML IJ SOLN
10.00 | INTRAMUSCULAR | Status: DC
Start: ? — End: 2017-06-01

## 2017-06-01 MED ORDER — POTASSIUM CHLORIDE CRYS ER 20 MEQ PO TBCR
EXTENDED_RELEASE_TABLET | ORAL | Status: DC
Start: 2017-05-31 — End: 2017-06-01

## 2017-06-01 MED ORDER — GENERIC EXTERNAL MEDICATION
Status: DC
Start: ? — End: 2017-06-01

## 2017-06-01 MED ORDER — NITROGLYCERIN 0.4 MG SL SUBL
0.40 | SUBLINGUAL_TABLET | SUBLINGUAL | Status: DC
Start: ? — End: 2017-06-01

## 2017-06-01 MED ORDER — SODIUM CHLORIDE 0.9 % IV SOLN
INTRAVENOUS | Status: DC
Start: ? — End: 2017-06-01

## 2017-06-01 MED ORDER — GENERIC EXTERNAL MEDICATION
1.00 | Status: DC
Start: 2017-05-31 — End: 2017-06-01

## 2017-06-01 MED ORDER — OSELTAMIVIR PHOSPHATE 75 MG PO CAPS
75.00 | ORAL_CAPSULE | ORAL | Status: DC
Start: 2017-05-30 — End: 2017-06-01

## 2017-06-01 MED ORDER — METOPROLOL SUCCINATE ER 25 MG PO TB24
25.00 | ORAL_TABLET | ORAL | Status: DC
Start: 2017-05-31 — End: 2017-06-01

## 2017-06-01 MED ORDER — ACETAMINOPHEN 650 MG RE SUPP
650.00 | RECTAL | Status: DC
Start: ? — End: 2017-06-01

## 2017-06-01 MED ORDER — HEPARIN SODIUM (PORCINE) 5000 UNIT/ML IJ SOLN
INTRAMUSCULAR | Status: DC
Start: 2017-05-30 — End: 2017-06-01

## 2017-06-01 MED ORDER — DOXAZOSIN MESYLATE 4 MG PO TABS
8.00 | ORAL_TABLET | ORAL | Status: DC
Start: 2017-05-30 — End: 2017-06-01

## 2017-06-01 MED ORDER — PRAVASTATIN SODIUM 40 MG PO TABS
40.00 | ORAL_TABLET | ORAL | Status: DC
Start: 2017-05-30 — End: 2017-06-01

## 2017-06-01 MED ORDER — GENERIC EXTERNAL MEDICATION
500.00 | Status: DC
Start: ? — End: 2017-06-01

## 2017-06-01 MED ORDER — GUAIFENESIN 100 MG/5ML PO LIQD
200.00 | ORAL | Status: DC
Start: ? — End: 2017-06-01

## 2017-06-01 MED ORDER — ACETAMINOPHEN 325 MG PO TABS
650.00 | ORAL_TABLET | ORAL | Status: DC
Start: ? — End: 2017-06-01

## 2017-06-01 MED ORDER — FUROSEMIDE 10 MG/ML IJ SOLN
20.00 | INTRAMUSCULAR | Status: DC
Start: 2017-05-30 — End: 2017-06-01

## 2017-06-01 MED ORDER — LISINOPRIL 20 MG PO TABS
40.00 | ORAL_TABLET | ORAL | Status: DC
Start: 2017-05-31 — End: 2017-06-01

## 2017-06-06 DIAGNOSIS — I10 Essential (primary) hypertension: Secondary | ICD-10-CM | POA: Diagnosis not present

## 2017-06-06 DIAGNOSIS — Z09 Encounter for follow-up examination after completed treatment for conditions other than malignant neoplasm: Secondary | ICD-10-CM | POA: Diagnosis not present

## 2017-07-17 ENCOUNTER — Other Ambulatory Visit: Payer: Self-pay

## 2017-07-17 ENCOUNTER — Encounter: Payer: Self-pay | Admitting: Physician Assistant

## 2017-07-17 ENCOUNTER — Ambulatory Visit: Payer: Medicare Other | Admitting: Physician Assistant

## 2017-07-17 ENCOUNTER — Ambulatory Visit (INDEPENDENT_AMBULATORY_CARE_PROVIDER_SITE_OTHER): Payer: Medicare Other

## 2017-07-17 VITALS — BP 165/78 | HR 95 | Temp 98.4°F | Resp 16 | Wt 149.0 lb

## 2017-07-17 DIAGNOSIS — J22 Unspecified acute lower respiratory infection: Secondary | ICD-10-CM | POA: Diagnosis not present

## 2017-07-17 DIAGNOSIS — R05 Cough: Secondary | ICD-10-CM | POA: Diagnosis not present

## 2017-07-17 DIAGNOSIS — Z8701 Personal history of pneumonia (recurrent): Secondary | ICD-10-CM | POA: Insufficient documentation

## 2017-07-17 MED ORDER — DOXYCYCLINE HYCLATE 100 MG PO TABS: 100.0000 mg | ORAL_TABLET | Freq: Two times a day (BID) | ORAL | 0 refills | Status: AC

## 2017-07-17 MED ORDER — PREDNISONE 50 MG PO TABS: ORAL_TABLET | ORAL | 0 refills | Status: DC

## 2017-07-17 NOTE — Patient Instructions (Addendum)
-   downstairs for chest x-ray now - start steroid and doxycycline - Prednisone daily with breakfast for 5 days - antibiotic twice a day for 1 week

## 2017-07-17 NOTE — Progress Notes (Signed)
HPI:                                                                Ryan Bernard is a 84 y.o. male who presents to [facility]: Primary Care Sports Medicine today for cough  This is a pleasant 84 yo M with PMH of CAD s/p CABG, HTN, Type 2 DM, CKD, BPH who presents with productive cough and wheezing x 5 days, gradually worsening. Sputum is yellow. Cough is worse at night. Denies fever, chills, chest pain, hemoptysis, dyspnea. Using Albuterol 3 times per day He was hospitalized for pneumonia less than 2 months ago    No flowsheet data found.    Past Medical History:  Diagnosis Date  . Anemia   . BPH (benign prostatic hyperplasia)   . CAD (coronary artery disease) 1995   w/ angioplasty  . Diabetes mellitus 2004   type 2  . Heart disease    Mild valvular w/ pulm HTN  . Hyperlipidemia   . Hypertension   . SBE (subacute bacterial endocarditis) prophylaxis candidate    Past Surgical History:  Procedure Laterality Date  . ANGIOPLASTY  1995   [facility]  . BACK SURGERY  1964  . CORONARY ARTERY BYPASS GRAFT  10/27/2009   5 vessel, Dr. Raoul Pitch  . HERNIA REPAIR     x 2    Social History   Tobacco Use  . Smoking status: Never Smoker  . Smokeless tobacco: Never Used  Substance Use Topics  . Alcohol use: No   family history includes Alcohol abuse in his father; Depression in his daughter and son; Heart disease in his mother; Hypertension in his mother.    ROS: negative except as noted in the HPI  Medications: Current Outpatient Medications  Medication Sig Dispense Refill  . albuterol (PROVENTIL HFA;VENTOLIN HFA) 108 (90 Base) MCG/ACT inhaler Inhale 2 puffs into the lungs every 6 (six) hours as needed for wheezing. 2 Inhaler 11  . aspirin 81 MG EC tablet Take 81 mg by mouth daily.     . benazepril (LOTENSIN) 40 MG tablet Take 40 mg by mouth daily.      . metoprolol succinate (TOPROL-XL) 50 MG 24 hr tablet Take 1 tablet (50 mg total) by mouth  daily. Take with or immediately following a meal. (Patient taking differently: Take 25 mg 2 (two) times daily by mouth. Take with or immediately following a meal.) 90 tablet 3  . pravastatin (PRAVACHOL) 80 MG tablet Take 1 tablet (80 mg total) by mouth at bedtime. 90 tablet 3  . terazosin (HYTRIN) 10 MG capsule Take 10 mg by mouth daily.       No current facility-administered medications for this visit.    No Known Allergies     Objective:  BP (!) 165/78   Pulse 95   Temp 98.4 F (36.9 C) (Oral)   Wt 149 lb (67.6 kg)   SpO2 94%   BMI 22.99 kg/m  Gen:  alert, not ill-appearing, no distress, appropriate for age HEENT: head normocephalic without obvious abnormality, conjunctiva and cornea clear, TM's pearly gray and semi-transparent, oropharynx clear, moist mucous membranes, neck supple, no cervical adenopathy, trachea midline Pulm: Normal work of breathing, normal phonation, breath sounds diminished on expiration, no wheezes,  rales or rhonchi CV: Normal rate, regular rhythm, s1 and s2 distinct, no murmurs, clicks or rubs  Neuro: alert and oriented x 3 MSK: extremities atraumatic, wide, slow gait and station Skin: intact, no rashes on exposed skin, no jaundice, no cyanosis Psych: well-groomed, cooperative, good eye contact, euthymic mood, affect mood-congruent, speech is articulate, and thought processes clear and goal-directed  CLINICAL DATA:  84 year old male with cough for 1 week.  EXAM: CHEST - 2 VIEW  COMPARISON:  None.  FINDINGS: Prior CABG. Mild cardiomegaly. Other mediastinal contours are within normal limits. Visualized tracheal air column is within normal limits. Increased basilar predominant pulmonary interstitial opacity in both lungs. More confluent bibasilar opacity which may reflect small pleural effusions. No pneumothorax. No consolidation.  No acute osseous abnormality identified. Postoperative changes to the proximal right humerus. Abdomen Calcified  aortic atherosclerosis. Negative visible bowel gas pattern.  IMPRESSION: 1. Abnormal increased pulmonary interstitial opacity, and more confluent bibasilar opacity. Favor acute pulmonary edema with small pleural effusions. Other differential considerations include viral/atypical respiratory infection with a fusions and chronic lung disease with bibasilar scarring. 2. Prior CABG.  Aortic Atherosclerosis (ICD10-I70.0).   Electronically Signed   By: Odessa Fleming M.D.   On: 05/22/2017 14:44  No results found for this or any previous visit (from the past 72 hour(s)). No results found.    Assessment and Plan: 84 y.o. male with   Acute lower respiratory infection - Plan: predniSONE (DELTASONE) 50 MG tablet, DG Chest 2 View  History of pneumonia - Plan: DG Chest 2 View  - relative hypoxia of 92-94% on RA at rest, mild tachycardia (95-102 on beta blocker), no tachypnea, afebrile. recent history of pneumonia.  Treating aggressively with Doxycyline and Prednisone. Chest x-ray today    Patient education and anticipatory guidance given Patient agrees with treatment plan Follow-up in 3-5 days or sooner as needed if symptoms worsen or fail to improve  Levonne Hubert PA-C

## 2017-07-17 NOTE — Progress Notes (Signed)
Good evening Ryan Bernard,  Your chest x-ray was still abnormal, but stable compared to your March x-ray. Treatment plan does not change. Please let our office know if you are feeling worse tomorrow (Friday) and please go to the nearest emergency room over the weekend if you have a fever, shortness of breath, chest pain, or blood in your sputum.  Recommend close follow-up in our office on Monday.  Best, [name]

## 2017-07-21 ENCOUNTER — Encounter: Payer: Self-pay | Admitting: Physician Assistant

## 2017-07-21 ENCOUNTER — Ambulatory Visit (INDEPENDENT_AMBULATORY_CARE_PROVIDER_SITE_OTHER): Payer: Medicare Other | Admitting: Physician Assistant

## 2017-07-21 VITALS — BP 166/73 | HR 54 | Temp 97.5°F | Wt 148.0 lb

## 2017-07-21 DIAGNOSIS — J189 Pneumonia, unspecified organism: Secondary | ICD-10-CM | POA: Diagnosis not present

## 2017-07-21 NOTE — Patient Instructions (Addendum)
-   finish your Doxycycline - repeat your Chest x-ray in 5-6 weeks (no appointment needed. Walk-in to imaging department    Community-Acquired Pneumonia, Adult Pneumonia is an infection of the lungs. One type of pneumonia can happen while a person is in a hospital. A different type can happen when a person is not in a hospital (community-acquired pneumonia). It is easy for this kind to spread from person to person. It can spread to you if you breathe near an infected person who coughs or sneezes. Some symptoms include:  A dry cough.  A wet (productive) cough.  Fever.  Sweating.  Chest pain.  Follow these instructions at home:  Take over-the-counter and prescription medicines only as told by your doctor. ? Only take cough medicine if you are losing sleep. ? If you were prescribed an antibiotic medicine, take it as told by your doctor. Do not stop taking the antibiotic even if you start to feel better.  Sleep with your head and neck raised (elevated). You can do this by putting a few pillows under your head, or you can sleep in a recliner.  Do not use tobacco products. These include cigarettes, chewing tobacco, and e-cigarettes. If you need help quitting, ask your doctor.  Drink enough water to keep your pee (urine) clear or pale yellow. A shot (vaccine) can help prevent pneumonia. Shots are often suggested for:  People older than 82 years of age.  People older than 82 years of age: ? Who are having cancer treatment. ? Who have long-term (chronic) lung disease. ? Who have problems with their body's defense system (immune system).  You may also prevent pneumonia if you take these actions:  Get the flu (influenza) shot every year.  Go to the dentist as often as told.  Wash your hands often. If soap and water are not available, use hand sanitizer.  Contact a doctor if:  You have a fever.  You lose sleep because your cough medicine does not help. Get help right away  if:  You are short of breath and it gets worse.  You have more chest pain.  Your sickness gets worse. This is very serious if: ? You are an older adult. ? Your body's defense system is weak.  You cough up blood. This information is not intended to replace advice given to you by your health care provider. Make sure you discuss any questions you have with your health care provider. Document Released: 08/14/2007 Document Revised: 08/03/2015 Document Reviewed: 06/22/2014 Elsevier Interactive Patient Education  [employer].

## 2017-07-21 NOTE — Progress Notes (Signed)
HPI:                                                                Ryan Bernard is a 84 y.o. male who presents to [facility]: Primary Care Sports Medicine today for cough follow-up  This is a pleasant 84 yo M with PMH of CAD s/p CABG, HTN, hx of PNA, Type 2 DM, CKD, BPH who presents for follow-up of acute lower respiratory infection diagnosed 5 days ago. He presented with a productive cough and wheezing x 5 days, gradually worsening. His CXR showed patchy parahilar lung opacities, bibasilar opacities and b/l costophrenic angle pleural thickening. He was treated with Doxycycline and Prednisone. Reports he is feeling improved. He is coughing less frequently and there is less sputum. Endorses some wheezing when laying flat at night. Using Albuterol twice per day (morning and evening), which is improved from 3-4 times dialy. Denies fever, chills, chest pain, hemoptysis, dyspnea.    Depression screen [facility] 2/9 07/21/2017 10/30/2016 04/26/2016 11/02/2015 06/10/2013  Decreased Interest 0 0 0 0 0  Down, Depressed, Hopeless 0 0 0 0 0  PHQ - 2 Score 0 0 0 0 0  Altered sleeping - 0 - - -  Tired, decreased energy - 0 - - -  Change in appetite - 0 - - -  Feeling bad or failure about yourself  - 0 - - -  Trouble concentrating - 0 - - -  Moving slowly or fidgety/restless - 0 - - -  Suicidal thoughts - 0 - - -  PHQ-9 Score - 0 - - -    No flowsheet data found.    Past Medical History:  Diagnosis Date  . Anemia   . BPH (benign prostatic hyperplasia)   . CAD (coronary artery disease) 1995   w/ angioplasty  . Diabetes mellitus 2004   type 2  . Heart disease    Mild valvular w/ pulm HTN  . Hyperlipidemia   . Hypertension   . SBE (subacute bacterial endocarditis) prophylaxis candidate    Past Surgical History:  Procedure Laterality Date  . ANGIOPLASTY  1995   [facility]  . BACK SURGERY  1964  . CORONARY ARTERY BYPASS GRAFT  10/27/2009   5 vessel, Dr. Raoul Pitch  .  HERNIA REPAIR     x 2    Social History   Tobacco Use  . Smoking status: Never Smoker  . Smokeless tobacco: Never Used  Substance Use Topics  . Alcohol use: No   family history includes Alcohol abuse in his father; Depression in his daughter and son; Heart disease in his mother; Hypertension in his mother.    ROS: negative except as noted in the HPI  Medications: Current Outpatient Medications  Medication Sig Dispense Refill  . albuterol (PROVENTIL HFA;VENTOLIN HFA) 108 (90 Base) MCG/ACT inhaler Inhale 2 puffs into the lungs every 6 (six) hours as needed for wheezing. 2 Inhaler 11  . aspirin 81 MG EC tablet Take 81 mg by mouth daily.     . benazepril (LOTENSIN) 40 MG tablet Take 40 mg by mouth daily.      Marland Kitchen doxycycline (VIBRA-TABS) 100 MG tablet Take 1 tablet (100 mg total) by mouth 2 (two) times daily for 7 days. 14  tablet 0  . metoprolol succinate (TOPROL-XL) 50 MG 24 hr tablet Take 1 tablet (50 mg total) by mouth daily. Take with or immediately following a meal. (Patient taking differently: Take 25 mg 2 (two) times daily by mouth. Take with or immediately following a meal.) 90 tablet 3  . pravastatin (PRAVACHOL) 80 MG tablet Take 1 tablet (80 mg total) by mouth at bedtime. 90 tablet 3  . terazosin (HYTRIN) 10 MG capsule Take 10 mg by mouth daily.       No current facility-administered medications for this visit.    No Known Allergies     Objective:  BP (!) 166/73   Pulse (!) 54   Temp (!) 97.5 F (36.4 C) (Oral)   Wt 148 lb (67.1 kg)   SpO2 95%   BMI 22.84 kg/m  Gen:  alert, not ill-appearing, no distress, appropriate for age HEENT: head normocephalic without obvious abnormality, conjunctiva and cornea clear, trachea midline Pulm: Normal work of breathing, normal phonation, clear to auscultation bilaterally, no wheezes, rales or rhonchi CV: Normal rate, regular rhythm, s1 and s2 distinct, grade III/VI systolic murmur Neuro: alert and oriented x 3, no tremor MSK:  extremities atraumatic, normal gait and station Skin: warm, dry, intact; skin is thin and fair with a purple-hue, lips appear cyanotic Psych: well-groomed, cooperative, good eye contact, euthymic mood, affect mood-congruent, speech is articulate, and thought processes clear and goal-directed  CLINICAL DATA:  Acute lower respiratory infection.  Cough.  EXAM: CHEST - 2 VIEW  COMPARISON:  05/22/2017 chest radiograph.  FINDINGS: Intact sternotomy wires. CABG clips overlie the mediastinum. Stable cardiomediastinal silhouette with top-normal heart size. No pneumothorax. Hyperinflated lungs. There are hazy patchy parahilar opacities in the right greater than left lungs, similar to the prior chest radiograph. There is patchy opacity of both lung bases, similar. There is pleural thickening at the costophrenic angles bilaterally. Soft tissue anchors overlie the right humeral head.  IMPRESSION: 1. Patchy hazy right greater than left parahilar lung opacities, patchy bibasilar lung opacities and bilateral costophrenic angle pleural thickening. Findings are not significantly changed in the interval. Differential is broad and includes persistent atypical infection, pulmonary edema, small bilateral pleural effusions or chronic interstitial lung disease. 2. Hyperinflated lungs, suggesting obstructive lung disease.   Electronically Signed   By: Delbert Phenix M.D.   On: 07/17/2017 10:13   No results found for this or any previous visit (from the past 72 hour(s)). No results found.    Assessment and Plan: 84 y.o. male with   Community acquired pneumonia, unspecified laterality - Plan: DG Chest 2 View - clinically improving. Lips have a cyanotic appearance, but skin is fair and purple-hued. SpO2 is 95% on RA at rest with normal effort. No adventitious lung sounds - finish Doxycycline - continue Albuterol 1 puff QAM and QHS - repeat CXR in 5 weeks to assess for resolution of patchy  airspace disease  Patient education and anticipatory guidance given Patient agrees with treatment plan Follow-up as needed if symptoms worsen or fail to improve  Levonne Hubert PA-C

## 2017-09-08 ENCOUNTER — Encounter: Payer: Self-pay | Admitting: Physician Assistant

## 2017-09-08 ENCOUNTER — Ambulatory Visit (INDEPENDENT_AMBULATORY_CARE_PROVIDER_SITE_OTHER): Payer: Medicare Other

## 2017-09-08 DIAGNOSIS — J189 Pneumonia, unspecified organism: Secondary | ICD-10-CM

## 2017-09-08 DIAGNOSIS — Z951 Presence of aortocoronary bypass graft: Secondary | ICD-10-CM

## 2017-09-08 DIAGNOSIS — I509 Heart failure, unspecified: Secondary | ICD-10-CM

## 2017-09-08 DIAGNOSIS — Z8701 Personal history of pneumonia (recurrent): Secondary | ICD-10-CM

## 2017-09-08 DIAGNOSIS — I5020 Unspecified systolic (congestive) heart failure: Secondary | ICD-10-CM | POA: Insufficient documentation

## 2017-09-08 DIAGNOSIS — R0602 Shortness of breath: Secondary | ICD-10-CM | POA: Diagnosis not present

## 2017-09-08 NOTE — Progress Notes (Signed)
Good morning Ryan Bernard,  Your CXR shows pneumonia has resolved. However, there is a bit of fluid in your lungs related to your congestive heart failure. I would recommend following up with your cardiologist regarding this within the month or sooner if you are having worsening shortness of breath.  Vinetta Bergamoharley

## 2017-10-03 DIAGNOSIS — I10 Essential (primary) hypertension: Secondary | ICD-10-CM | POA: Diagnosis not present

## 2017-10-03 DIAGNOSIS — I340 Nonrheumatic mitral (valve) insufficiency: Secondary | ICD-10-CM | POA: Insufficient documentation

## 2017-10-03 DIAGNOSIS — E785 Hyperlipidemia, unspecified: Secondary | ICD-10-CM | POA: Diagnosis not present

## 2017-10-03 DIAGNOSIS — Z951 Presence of aortocoronary bypass graft: Secondary | ICD-10-CM | POA: Diagnosis not present

## 2017-10-03 DIAGNOSIS — I2510 Atherosclerotic heart disease of native coronary artery without angina pectoris: Secondary | ICD-10-CM | POA: Diagnosis not present

## 2017-10-08 DIAGNOSIS — I088 Other rheumatic multiple valve diseases: Secondary | ICD-10-CM | POA: Diagnosis not present

## 2017-10-08 DIAGNOSIS — J90 Pleural effusion, not elsewhere classified: Secondary | ICD-10-CM | POA: Diagnosis not present

## 2017-11-03 DIAGNOSIS — Z951 Presence of aortocoronary bypass graft: Secondary | ICD-10-CM | POA: Diagnosis not present

## 2017-11-03 DIAGNOSIS — I340 Nonrheumatic mitral (valve) insufficiency: Secondary | ICD-10-CM | POA: Diagnosis not present

## 2017-11-03 DIAGNOSIS — I2510 Atherosclerotic heart disease of native coronary artery without angina pectoris: Secondary | ICD-10-CM | POA: Diagnosis not present

## 2017-11-03 DIAGNOSIS — I10 Essential (primary) hypertension: Secondary | ICD-10-CM | POA: Diagnosis not present

## 2017-11-28 ENCOUNTER — Ambulatory Visit (INDEPENDENT_AMBULATORY_CARE_PROVIDER_SITE_OTHER): Payer: Medicare Other

## 2017-11-28 ENCOUNTER — Encounter: Payer: Self-pay | Admitting: Osteopathic Medicine

## 2017-11-28 ENCOUNTER — Ambulatory Visit: Payer: Medicare Other | Admitting: Osteopathic Medicine

## 2017-11-28 VITALS — BP 190/85 | HR 69 | Temp 97.6°F | Wt 152.9 lb

## 2017-11-28 DIAGNOSIS — I509 Heart failure, unspecified: Secondary | ICD-10-CM | POA: Diagnosis not present

## 2017-11-28 DIAGNOSIS — R0602 Shortness of breath: Secondary | ICD-10-CM | POA: Diagnosis not present

## 2017-11-28 DIAGNOSIS — J90 Pleural effusion, not elsewhere classified: Secondary | ICD-10-CM

## 2017-11-28 MED ORDER — AMBULATORY NON FORMULARY MEDICATION: 1 refills | Status: DC

## 2017-11-28 MED ORDER — FUROSEMIDE 40 MG PO TABS: ORAL_TABLET | ORAL | 3 refills | Status: DC

## 2017-11-28 NOTE — Progress Notes (Signed)
HPI: Ryan Bernard is a 84 y.o. male who  has a past medical history of Anemia, BPH (benign prostatic hyperplasia), CAD (coronary artery disease) (1995), Diabetes mellitus (2004), Heart disease, Hyperlipidemia, Hypertension, and SBE (subacute bacterial endocarditis) prophylaxis candidate.  he presents to [facility] today, 11/28/17,  for chief complaint of:  Breathing trouble    . Context: feels like the pneumonia he had this spring . Location: chest  . Quality: SOB, trouble sleeping due to wheezing . Duration: started yesterday   He also recently saw cardiology, 11/03/2017: 2 cardiomyopathy with LVEF of 35 to 40%, echocardiogram 1 month prior to that visit.  Typically walks about 3 miles a day without difficulty.  At that visit, he was feeling well and follow-up in 1 year was advised.     Past medical history, surgical history, and family history reviewed.  Current medication list and allergy/intolerance information reviewed.   (See remainder of HPI, ROS, Phys Exam below)  BP (!) 183/90 (BP Location: Left Arm, Patient Position: Sitting, Cuff Size: Normal)   Pulse 74   Temp 97.6 F (36.4 C) (Oral)   Wt 152 lb 14.4 oz (69.4 kg)   SpO2 92%   BMI 23.59 kg/m   Dg Chest 2 View  Result Date: 11/28/2017 CLINICAL DATA:  Shortness of Breath EXAM: CHEST - 2 VIEW COMPARISON:  September 08, 2017 FINDINGS: There is a small left pleural effusion. There is atelectatic change in each lower lung region and right mid lung. There is no consolidation or appreciable edema. Heart is borderline enlarged with pulmonary vascularity normal. Patient is status post coronary artery bypass grafting. No adenopathy. There is anterior wedging of upper thoracic vertebral bodies, stable. IMPRESSION: Small left pleural effusion. Atelectatic change bilaterally, stable. No frank edema or consolidation. Stable cardiac silhouette. Stable wedging of upper thoracic vertebral bodies.  Electronically Signed   By: Bretta Bang III M.D.   On: 11/28/2017 09:08   Chest x-ray personally reviewed and compared to previous from July and may, went over results and images with patient and his son.      ASSESSMENT/PLAN:   Acute congestive heart failure, unspecified heart failure type (HCC) - Plan: CBC, COMPLETE METABOLIC PANEL WITH GFR, B Nat Peptide, DG Chest 2 View  Shortness of breath - Plan: DG Chest 2 View  Patient appears stable and in good spirits, will trial increased dose Lasix for first 1-2 days and then taper off. He is not on maintenance diuretics but he's been stable without them. Will await lab results esp check renal fxn. Pt's son is w/ him today and knows what to watch for which would warrant hospital visit, especially worse SOB, confusion, fever, LE swelling, chest pain.    Meds ordered this encounter  Medications  . furosemide (LASIX) 40 MG tablet    Sig: 80 mg (two tablet) two to three times per day until breathing is better, then 40 mg (one tablet) twice per day until told by Dr A    Dispense:  60 tablet    Refill:  3  . AMBULATORY NON FORMULARY MEDICATION    Sig: Incentive spirometer, Disp #1, refill #1, Dx atelectasis    Dispense:  1 Units    Refill:  1   Orders Placed This Encounter  Procedures  . DG Chest 2 View - today  . DG Chest 2 View - future for Monday   . CBC  . COMPLETE METABOLIC PANEL WITH GFR  . B Nat Peptide  Follow-up plan: Return in about 3 days (around 12/01/2017) for recheck breathing and Xray .                         ############################################ ############################################ ############################################ ############################################    Outpatient Encounter Medications as of 11/28/2017  Medication Sig  . albuterol (PROVENTIL HFA;VENTOLIN HFA) 108 (90 Base) MCG/ACT inhaler Inhale 2 puffs into the lungs every 6 (six) hours as needed  for wheezing.  Marland Kitchen. aspirin 81 MG EC tablet Take 81 mg by mouth daily.   . benazepril (LOTENSIN) 40 MG tablet Take 40 mg by mouth daily.    . metoprolol succinate (TOPROL-XL) 50 MG 24 hr tablet Take 1 tablet (50 mg total) by mouth daily. Take with or immediately following a meal. (Patient taking differently: Take 25 mg 2 (two) times daily by mouth. Take with or immediately following a meal.)  . pravastatin (PRAVACHOL) 80 MG tablet Take 1 tablet (80 mg total) by mouth at bedtime.  Marland Kitchen. terazosin (HYTRIN) 10 MG capsule Take 10 mg by mouth daily.     No facility-administered encounter medications on file as of 11/28/2017.    No Known Allergies    Review of Systems:  Constitutional: No recent illness  HEENT: No  headache, no vision change  Cardiac: No  chest pain, No  pressure, No palpitations  Respiratory:  +shortness of breath. No  Cough  Gastrointestinal: No  abdominal pain, no change on bowel habits  Musculoskeletal: No new myalgia/arthralgia  Skin: No  Rash  Hem/Onc: No  easy bruising/bleeding, No  abnormal lumps/bumps  Neurologic: No  weakness, No  Dizziness  Psychiatric: No  concerns with depression, No  concerns with anxiety  Exam:  BP (!) 183/90 (BP Location: Left Arm, Patient Position: Sitting, Cuff Size: Normal)   Pulse 74   Temp 97.6 F (36.4 C) (Oral)   Wt 152 lb 14.4 oz (69.4 kg)   SpO2 92%   BMI 23.59 kg/m   Constitutional: VS see above. General Appearance: alert, well-developed, well-nourished, NAD  Eyes: Normal lids and conjunctive, non-icteric sclera  Ears, Nose, Mouth, Throat: MMM, Normal external inspection ears/nares/mouth/lips/gums.  Neck: No masses, trachea midline.   Respiratory: Normal respiratory effort. no wheeze, no rhonchi, +rales bilateral lung bases worse in LLL.   Cardiovascular: S1/S2 normal, +murmur, no rub/gallop auscultated. RRR.   Musculoskeletal: Gait normal. Symmetric and independent movement of all extremities  Neurological:  Normal balance/coordination. No tremor.  Skin: warm, dry, intact.   Psychiatric: Normal judgment/insight. Normal mood and affect. Oriented x3. Good spirits.   Visit summary with medication list and pertinent instructions was printed for patient to review, advised to alert us if any changes needed. All questions at time of visit were answered - patient instructed to contact office with any additional concerns. ER/RTC precautions were reviewed with the patient and understanding verbalized.   Follow-up plan: Return in about 3 days (around 12/01/2017) for recheck breathing and Xray .  Note: Total time spent 25 minutes, greater than 50% of the visit was spent face-to-face counseling and coordinating care for the following: The primary encounter diagnosis was Acute congestive heart failure, unspecified heart failure type (HCC). A diagnosis of Shortness of breath was also pertinent to this visit.Marland Kitchen.  Please note: voice recognition software was used to produce this document, and typos may escape review. Please contact Dr. Lyn HollingsheadAlexander for any needed clarifications.

## 2017-11-28 NOTE — Patient Instructions (Signed)
Incentive Spirometer  An incentive spirometer is a tool that measures how well you are filling your lungs with each breath. This tool can help keep your lungs clear and active. Taking long, deep breaths may help reverse or decrease the chance of developing breathing (pulmonary) problems, especially infection, following:  · Surgery of the chest or abdomen.  · Surgery if you have a history of smoking or a lung problem.  · A long period of time when you are unable to move or be active.    If the spirometer includes an indicator to show your best effort, your health care provider or respiratory therapist will help you set a goal. Keep a log of your progress if directed by your health care provider.  What are the risks?  · Breathing too quickly may cause dizziness or cause you to pass out. Take your time so you do not get dizzy or lightheaded.  · If you are in pain, you may need to take or ask for pain medicine before doing incentive spirometry. It is harder to take a deep breath if you are having pain.  How to use your incentive spirometer  1. Sit on the edge of your bed if possible, or sit up as far as you can in bed or on a chair.  2. Hold the incentive spirometer in an upright position.  3. Breathe out normally.  4. Place the mouthpiece in your mouth and seal your lips tightly around it.  5. Breathe in slowly and as deeply as possible, raising the piston or the ball toward the top of the column.  6. Hold your breath for 3–5 seconds or for as long as possible. Allow the piston or ball to fall to the bottom of the column.  7. Remove the mouthpiece from your mouth and breathe out normally.  8. The spirometer may include an indicator to show your best effort. Use the indicator as a goal to work toward during each repetition.  9. Rest for a few seconds and repeat this at least 10 times, every 1–2 hours when you are awake. Take your time and take a few normal breaths  between deep breaths. Breathing too quickly may cause dizziness or cause you to pass out. Take your time so you do not get dizzy or lightheaded.  10. After each set of 10 deep breaths, practice coughing to be sure your lungs are clear. If you had a surgical cut (incision) made during surgery, support your incision when coughing by placing a pillow or rolled-up towel firmly against it.  Once you are able to get out of bed, walk around indoors and cough well. You may stop using the incentive spirometer when instructed by your health care provider.  Contact a health care provider if:  · You are having difficulty using the spirometer.  · You have trouble using the spirometer as often as instructed.  · Your pain medicine is not giving enough relief while using the spirometer.  · You have a fever.  · You develop shortness of breath.  Get help right away if:  · You develop a cough with bloody sputum.  · You develop worsening pain, redness, or discharge at or near the incision site.  This information is not intended to replace advice given to you by your health care provider. Make sure you discuss any questions you have with your health care provider.  Document Released: 07/08/2006 Document Revised: 11/20/2015 Document Reviewed: 10/04/2013  Elsevier Interactive Patient Education ©   2018 [employer]

## 2017-11-29 LAB — COMPLETE METABOLIC PANEL WITH GFR
AG Ratio: 1.5 (calc) (ref 1.0–2.5)
ALBUMIN MSPROF: 4.3 g/dL (ref 3.6–5.1)
ALT: 10 U/L (ref 9–46)
AST: 14 U/L (ref 10–35)
Alkaline phosphatase (APISO): 99 U/L (ref 40–115)
BUN: 15 mg/dL (ref 7–25)
CALCIUM: 9.6 mg/dL (ref 8.6–10.3)
CO2: 31 mmol/L (ref 20–32)
CREATININE: 1.08 mg/dL (ref 0.70–1.11)
Chloride: 101 mmol/L (ref 98–110)
GFR, EST NON AFRICAN AMERICAN: 63 mL/min/{1.73_m2} (ref >=60)
GFR, Est African American: 73 mL/min/{1.73_m2} (ref >=60)
GLUCOSE: 127 mg/dL — AB (ref 65–99)
Globulin: 2.8 g/dL (calc) (ref 1.9–3.7)
Potassium: 4.7 mmol/L (ref 3.5–5.3)
SODIUM: 142 mmol/L (ref 135–146)
Total Bilirubin: 1.2 mg/dL (ref 0.2–1.2)
Total Protein: 7.1 g/dL (ref 6.1–8.1)

## 2017-11-29 LAB — CBC
HCT: 40.6 % (ref 38.5–50.0)
HEMOGLOBIN: 13.9 g/dL (ref 13.2–17.1)
MCH: 31 pg (ref 27.0–33.0)
MCHC: 34.2 g/dL (ref 32.0–36.0)
MCV: 90.6 fL (ref 80.0–100.0)
MPV: 10.9 fL (ref 7.5–12.5)
Platelets: 161 10*3/uL (ref 140–400)
RBC: 4.48 10*6/uL (ref 4.20–5.80)
RDW: 12.9 % (ref 11.0–15.0)
WBC: 6 10*3/uL (ref 3.8–10.8)

## 2017-11-29 LAB — BRAIN NATRIURETIC PEPTIDE: Brain Natriuretic Peptide: 2024 pg/mL — ABNORMAL HIGH (ref <100)

## 2017-12-01 ENCOUNTER — Ambulatory Visit (INDEPENDENT_AMBULATORY_CARE_PROVIDER_SITE_OTHER): Payer: Medicare Other | Admitting: Physician Assistant

## 2017-12-01 ENCOUNTER — Other Ambulatory Visit: Payer: Self-pay

## 2017-12-01 ENCOUNTER — Ambulatory Visit (INDEPENDENT_AMBULATORY_CARE_PROVIDER_SITE_OTHER): Payer: Medicare Other

## 2017-12-01 ENCOUNTER — Ambulatory Visit: Payer: Medicare Other | Admitting: Osteopathic Medicine

## 2017-12-01 VITALS — BP 118/73 | HR 60 | Wt 141.0 lb

## 2017-12-01 DIAGNOSIS — Z951 Presence of aortocoronary bypass graft: Secondary | ICD-10-CM | POA: Diagnosis not present

## 2017-12-01 DIAGNOSIS — I509 Heart failure, unspecified: Secondary | ICD-10-CM

## 2017-12-01 DIAGNOSIS — I5020 Unspecified systolic (congestive) heart failure: Secondary | ICD-10-CM

## 2017-12-01 DIAGNOSIS — Z23 Encounter for immunization: Secondary | ICD-10-CM

## 2017-12-01 MED ORDER — FUROSEMIDE 40 MG PO TABS: ORAL_TABLET | ORAL | 3 refills | Status: DC

## 2017-12-01 NOTE — Progress Notes (Signed)
HPI:                                                                Ryan Bernard is a 84 y.o. male who presents to [facility]: Primary Care Sports Medicine today for CHF follow-up  This is a pleasant 84 yo M with PMH cardiomyopathy LVEF 35-40%, CAD s/p CABG, DM2, CKD stage 3 who presents for follow-up.  Ryan Bernard saw his PCP 3 days ago for breathing difficulties. He was felt to be having a heart failure exacerbation and he was started on Lasix 80 mg 2-3 times daily until breathing improves, then 40 mg bid. His BNP was increased at 2,024. His CXR showed diffuse atelectasis. His CBC and CMP were unremarkable.   Ryan Bernard reports today he is feeling significantly improved. He denies dyspnea, orthopnea, or edema. He denies fever, chills, cough, wheezing.  Depression screen [facility] 2/9 12/01/2017 07/21/2017 10/30/2016 04/26/2016 11/02/2015  Decreased Interest 0 0 0 0 0  Down, Depressed, Hopeless 0 0 0 0 0  PHQ - 2 Score 0 0 0 0 0  Altered sleeping - - 0 - -  Tired, decreased energy - - 0 - -  Change in appetite - - 0 - -  Feeling bad or failure about yourself  - - 0 - -  Trouble concentrating - - 0 - -  Moving slowly or fidgety/restless - - 0 - -  Suicidal thoughts - - 0 - -  PHQ-9 Score - - 0 - -    No flowsheet data found.    Past Medical History:  Diagnosis Date  . Anemia   . BPH (benign prostatic hyperplasia)   . CAD (coronary artery disease) 1995   w/ angioplasty  . Diabetes mellitus 2004   type 2  . Heart disease    Mild valvular w/ pulm HTN  . Hyperlipidemia   . Hypertension   . SBE (subacute bacterial endocarditis) prophylaxis candidate    Past Surgical History:  Procedure Laterality Date  . ANGIOPLASTY  1995   [facility]  . BACK SURGERY  1964  . CORONARY ARTERY BYPASS GRAFT  10/27/2009   5 vessel, Dr. Raoul Pitch  . HERNIA REPAIR     x 2    Social History   Tobacco Use  . Smoking status: Never Smoker  . Smokeless tobacco: Never Used  Substance Use  Topics  . Alcohol use: No   family history includes Alcohol abuse in his father; Depression in his daughter and son; Heart disease in his mother; Hypertension in his mother.    ROS: negative except as noted in the HPI  Medications: Current Outpatient Medications  Medication Sig Dispense Refill  . albuterol (PROVENTIL HFA;VENTOLIN HFA) 108 (90 Base) MCG/ACT inhaler Inhale 2 puffs into the lungs every 6 (six) hours as needed for wheezing. 2 Inhaler 11  . AMBULATORY NON FORMULARY MEDICATION Incentive spirometer, Disp #1, refill #1, Dx atelectasis 1 Units 1  . aspirin 81 MG EC tablet Take 81 mg by mouth daily.     . benazepril (LOTENSIN) 40 MG tablet Take 40 mg by mouth daily.      . furosemide (LASIX) 40 MG tablet Take 1 tab PO daily prn for weight gain >=5 pounds 60 tablet 3  .  metoprolol succinate (TOPROL-XL) 50 MG 24 hr tablet Take 1 tablet (50 mg total) by mouth daily. Take with or immediately following a meal. (Patient taking differently: Take 25 mg 2 (two) times daily by mouth. Take with or immediately following a meal.) 90 tablet 3  . pravastatin (PRAVACHOL) 80 MG tablet Take 1 tablet (80 mg total) by mouth at bedtime. 90 tablet 3  . terazosin (HYTRIN) 10 MG capsule Take 10 mg by mouth daily.       No current facility-administered medications for this visit.    No Known Allergies     Objective:  BP 118/73   Pulse 60   Wt 141 lb (64 kg)   SpO2 96%   BMI 21.76 kg/m  Gen:  alert, not ill-appearing, no distress, appropriate for age HEENT: head normocephalic without obvious abnormality, conjunctiva and cornea clear, trachea midline, no JVD Pulm: Normal work of breathing, normal phonation, clear to auscultation bilaterally, no wheezes, rales or rhonchi CV: Normal rate, regular rhythm, s1 and s2 distinct, no murmurs, clicks or rubs  Neuro: alert and oriented x 3, no tremor MSK: extremities atraumatic, normal gait and station, no peripheral edema Skin: intact, no rashes on  exposed skin, no jaundice, no cyanosis Psych: well-groomed, cooperative, good eye contact, euthymic mood, affect mood-congruent, speech is articulate, and thought processes clear and goal-directed  Wt Readings from Last 3 Encounters:  12/01/17 141 lb (64 kg)  11/28/17 152 lb 14.4 oz (69.4 kg)  07/21/17 148 lb (67.1 kg)     No results found for this or any previous visit (from the past 72 hour(s)). Dg Chest 2 View  Result Date: 12/02/2017 CLINICAL DATA:  Acute congestive heart failure. EXAM: CHEST - 2 VIEW COMPARISON:  11/28/2017.  07/17/2017. FINDINGS: Prior CABG. Stable cardiomegaly. Improved aeration of both lungs. No infiltrate noted on today's exam. Tiny bilateral pleural effusions and/or pleural scarring again noted. Stable mild compression fractures. IMPRESSION: 1.  Prior CABG.  Stable cardiomegaly. 2. Improved aeration of both lungs noted on today's exam. No focal infiltrate. Stable small bilateral pleural effusions and/or pleural scarring again noted. Electronically Signed   By: Ryan Bernard  Bernard   On: 12/02/2017 06:35      Assessment and Plan: 84 y.o. male with   .Ryan Bernard was seen today for follow-up.  Diagnoses and all orders for this visit:  ACC/AHA stage C heart failure with reduced ejection fraction (HCC) -     furosemide (LASIX) 40 MG tablet; Take 1 tab PO daily prn for weight gain >=5 pounds  Needs flu shot -     Flu vaccine HIGH DOSE PF (Fluzone High dose)  Other orders -     Discontinue: furosemide (LASIX) 40 MG tablet; Take 1 tab PO daily prn if weight >145    - personally reviewed CXR's from 11/28/17 and 12/01/17. Atelectasis has resolved and lungs appear well aerated. No pulmonary edema. - weight is down 11 pounds, no evidence of volume overload on exam today, SpO2 96% on RA at rest - patient instructed on how to continue Lasix prn for >=5 pound weight gain associated with edema and/or change in breathing. Encouraged to measure his dry weight daily - cont beta  blocker and ACE inhibitor - we also discussed that he is a candidate for Entresto based on his reduced EF. He was provided with information and will discuss with his cardiologist   Patient education and anticipatory guidance given Patient agrees with treatment plan Follow-up as needed if symptoms worsen or fail  to improve  Darlyne Russian PA-C

## 2017-12-01 NOTE — Patient Instructions (Addendum)
Limit sodium to no more than 2500 mg per day Measure weight every morning (get an accurate dry weight) Take Lasix for weight gain of 5 pounds or more associated with breathing difficulties or increased fluid retention  Living With Heart Failure  Heart failure is a long-term (chronic) condition in which the heart cannot pump enough blood through the body. When this happens, parts of the body do not get the blood and oxygen they need. There is no cure for heart failure at this time, so it is important for you to take good care of yourself and follow the treatment plan set by your health care provider. If you are living with heart failure, there are ways to help you manage the disease. Follow these instructions at home: Living with heart failure requires you to make changes in your life. Your health care team will teach you about the changes you need to make in order to relieve your symptoms and lower your risk of going to the hospital. Follow the treatment plan as set by your health care provider. Medicines Medicines are important in reducing your heart's workload, slowing the progression of heart failure, and improving your symptoms.  Take over-the-counter and prescription medicines only as told by your health care provider.  Do not stop taking your medicine unless your health care provider tells you to do that.  Do not skip any dose of your medicine.  Refill prescriptions before you run out of medicine. You need your medicines every day.  Eating and drinking  Eat heart-healthy foods. Talk with a dietitian to make an eating plan that is right for you. ? If directed by your health care provider: ? Limit salt (sodium). Lowering your sodium intake may reduce symptoms of heart failure. Ask a dietitian to recommend heart-healthy seasonings. ? Limit your fluid intake. Fluid restriction may reduce symptoms of heart failure. ? Use low-fat cooking methods instead of frying. Low-fat methods include  roasting, grilling, broiling, baking, poaching, steaming, and stir-frying. ? Choose foods that contain no trans fat and are low in saturated fat and cholesterol. Healthy choices include fresh or frozen fruits and vegetables, fish, lean meats, legumes, fat-free or low-fat dairy products, and whole-grain or high-fiber foods.  Limit alcohol intake to no more than 1 drink a day for nonpregnant women and 2 drinks a day for men. One drink equals 12 oz of beer, 5 oz of wine, or 1 oz of hard liquor. ? Drinking more than that is harmful to your heart. Tell your health care provider if you drink alcohol several times a week. ? Talk with your health care provider about whether any level of alcohol use is safe for you. Activity  Ask your health care provider about attending cardiac rehabilitation. These programs include aerobic physical activity, which provides many benefits for your heart.  If no cardiac rehabilitation program is available, ask your health care provider what aerobic exercises are safe for you to do. Lifestyle Make the lifestyle changes recommended by your health care provider. In general:  Lose weight if your health care provider tells you to do that. Weight loss may reduce symptoms of heart failure.  Do not use any products that contain nicotine or tobacco, such as cigarettes or e-cigarettes. If you need help quitting, ask your health care provider.  Do not use street (illegal) drugs.  Return to your normal activities as told by your health care provider. Ask your health care provider what activities are safe for you.  General instructions  Make sure you weigh yourself every day to track your weight. Rapid weight gain may indicate an increase in fluid in your body and may increase the workload of your heart. ? Weigh yourself every morning. Do this after you urinate but before you eat breakfast. ? Wear the same type of clothing, without shoes, each time you weigh yourself. ? Weigh  yourself on the same scale and in the same spot each time.  Living with chronic heart failure often leads to emotions such as fear, stress, anxiety, and depression. If you feel any of these emotions and need help coping, contact your health care provider. Other ways to get help include: ? Talking to friends and family members about your condition. They can give you support and guidance. Explain your symptoms to them and, if comfortable, invite them to attend appointments or rehabilitation with you. ? Joining a support group for people with chronic heart failure. Talking with other people who have the same symptoms may give you new ways of coping with your disease and your emotions.  Stay up to date with your shots (vaccines). Staying current on pneumococcal and influenza vaccines is especially important in preventing germs from attacking your airways (respiratory infections).  Keep all follow-up visits as told by your health care provider. This is important. How to recognize changes in your condition You and your family members need to know what changes to watch for in your condition. Watch for the following changes and report them to your health care provider:  Sudden weight gain. Ask your health care provider what amount of weight gain to report.  Shortness of breath: ? Feeling short of breath while at rest, with no exercise or activity that required great effort. ? Feeling breathless with activity.  Swelling of your lower legs or ankles.  Difficulty sleeping: ? You wake up feeling short of breath. ? You have to use more pillows to raise your head in order to sleep.  Frequent, dry, hacking cough.  Loss of appetite.  Feeling more tired all the time.  Depression or feelings of sadness or hopelessness.  Bloating in the stomach.  Where to find more information  Local support groups. Ask your health care provider about groups near you.  The American Heart Association:  www.heart.org Contact a health care provider if:  You have a rapid weight gain.  You have increasing shortness of breath that is unusual for you.  You are unable to participate in your usual physical activities.  You tire easily.  You cough more than normal, especially with physical activity.  You have any swelling or more swelling in areas such as your hands, feet, ankles, or abdomen.  You feel like your heart is beating quickly (palpitations).  You become dizzy or light-headed when you stand up. Get help right away if:  You have difficulty breathing.  You notice or your family notices a change in your awareness, such as having trouble staying awake or having difficulty with concentration.  You have pain or discomfort in your chest.  You have an episode of fainting (syncope). Summary  There is no cure for heart failure, so it is important for you to take good care of yourself and follow the treatment plan set by your health care provider.  Medicines are important in reducing your heart's workload, slowing the progression of heart failure, and improving your symptoms.  Living with chronic heart failure often leads to emotions such as fear, stress, anxiety, and depression. If you are  feeling any of these emotions and need help coping, contact your health care provider. This information is not intended to replace advice given to you by your health care provider. Make sure you discuss any questions you have with your health care provider. Document Released: 07/10/2016 Document Revised: 07/10/2016 Document Reviewed: 07/10/2016 Elsevier Interactive Patient Education  2018 [employer]

## 2017-12-02 ENCOUNTER — Encounter: Payer: Self-pay | Admitting: Physician Assistant

## 2017-12-31 NOTE — Progress Notes (Signed)
Subjective:   Darrek Leasure is a 84 y.o. male who presents for Medicare Annual/Subsequent preventive examination.  Review of Systems:  No ROS.  Medicare Wellness Visit. Additional risk factors are reflected in the social history.  Cardiac Risk Factors include: dyslipidemia;advanced age (>28men, >67 women);diabetes mellitus;male gender;sedentary lifestyle Sleep patterns: getting around 12 hours of sleep a night. Wakes up during the middle of night 2-3 times to go to the bathroom. Home Safety/Smoke Alarms: Feels safe in home. Smoke alarms in place.  Living environment;  Lives with son in 1 story home no stairs in house. Shower is a step over tub but has grab bars in place.     Male:   CCS-  Aged out   PSA- aged out Lab Results  Component Value Date   PSA 0.64 06/11/2010   PSA 0.84 05/10/2009   PSA 0.76 05/06/2008       Objective:    Vitals: BP (!) 170/90   Pulse 69   Ht 5\' 8"  (1.727 m)   Wt 151 lb (68.5 kg)   SpO2 96%   BMI 22.96 kg/m   Body mass index is 22.96 kg/m.  Advanced Directives 01/13/2018 12/14/2013 09/13/2013  Does Patient Have a Medical Advance Directive? Yes Yes Patient has advance directive, copy not in chart;Patient would not like information  Type of Advance Directive Healthcare Power of [place];Living will Living will Living will  Does patient want to make changes to medical advance directive? No - Patient declined - -  Copy of Healthcare Power of Attorney in Chart? No - copy requested - -    Tobacco Social History   Tobacco Use  Smoking Status Never Smoker  Smokeless Tobacco Never Used     Counseling given: Not Answered   Clinical Intake:  Pre-visit preparation completed: Yes  Pain : No/denies pain     Nutritional Risks: None Diabetes: Yes CBG done?: No Did pt. bring in CBG monitor from home?: No  How often do you need to have someone help you when you read instructions, pamphlets, or other written materials from your doctor or  pharmacy?: 1 - Never What is the last grade level you completed in school?: 12  Interpreter Needed?: No  Information entered by :: Carolin Sicks, LPN  Past Medical History:  Diagnosis Date  . Anemia   . BPH (benign prostatic hyperplasia)   . CAD (coronary artery disease) 1995   w/ angioplasty  . Diabetes mellitus 2004   type 2  . Heart disease    Mild valvular w/ pulm HTN  . Hyperlipidemia   . Hypertension   . SBE (subacute bacterial endocarditis) prophylaxis candidate    Past Surgical History:  Procedure Laterality Date  . ANGIOPLASTY  1995   [facility]  . BACK SURGERY  1964  . CORONARY ARTERY BYPASS GRAFT  10/27/2009   5 vessel, Dr. Raoul Pitch  . HERNIA REPAIR     x 2    Family History  Problem Relation Age of Onset  . Heart disease Mother   . Hypertension Mother   . Alcohol abuse Father   . Depression Daughter   . Depression Son    Social History   Socioeconomic History  . Marital status: Widowed    Spouse name: Not on file  . Number of children: 2  . Years of education: 24  . Highest education level: 12th grade  Occupational History  . Occupation: retired    Comment: Insurance claims handler  Social Needs  .  Financial resource strain: Not hard at all  . Food insecurity:    Worry: Never true    Inability: Never true  . Transportation needs:    Medical: No    Non-medical: No  Tobacco Use  . Smoking status: Never Smoker  . Smokeless tobacco: Never Used  Substance and Sexual Activity  . Alcohol use: No  . Drug use: No  . Sexual activity: Not Currently  Lifestyle  . Physical activity:    Days per week: 0 days    Minutes per session: 0 min  . Stress: Not at all  Relationships  . Social connections:    Talks on phone: Twice a week    Gets together: Once a week    Attends religious service: More than 4 times per year    Active member of club or organization: No    Attends meetings of clubs or organizations: Never    Relationship status: Widowed  Other  Topics Concern  . Not on file  Social History Narrative   Sits around house and watches TV. Drinks 1 cup of coffee in the morning    Outpatient Encounter Medications as of 01/13/2018  Medication Sig  . albuterol (PROVENTIL HFA;VENTOLIN HFA) 108 (90 Base) MCG/ACT inhaler Inhale 2 puffs into the lungs every 6 (six) hours as needed for wheezing.  . AMBULATORY NON FORMULARY MEDICATION Incentive spirometer, Disp #1, refill #1, Dx atelectasis  . aspirin 81 MG EC tablet Take 81 mg by mouth daily.   . benazepril (LOTENSIN) 40 MG tablet Take 40 mg by mouth daily.    . furosemide (LASIX) 40 MG tablet Take 1 tab PO daily prn for weight gain >=5 pounds  . metoprolol succinate (TOPROL-XL) 50 MG 24 hr tablet Take 1 tablet (50 mg total) by mouth daily. Take with or immediately following a meal. (Patient taking differently: Take 25 mg 2 (two) times daily by mouth. Take with or immediately following a meal.)  . pravastatin (PRAVACHOL) 80 MG tablet Take 1 tablet (80 mg total) by mouth at bedtime.  Marland Kitchen terazosin (HYTRIN) 10 MG capsule Take 10 mg by mouth daily.     No facility-administered encounter medications on file as of 01/13/2018.     Activities of Daily Living In your present state of health, do you have any difficulty performing the following activities: 01/13/2018  Hearing? N  Vision? N  Difficulty concentrating or making decisions? N  Walking or climbing stairs? N  Dressing or bathing? N  Doing errands, shopping? N  Preparing Food and eating ? N  Using the Toilet? N  In the past six months, have you accidently leaked urine? N  Do you have problems with loss of bowel control? N  Managing your Medications? N  Managing your Finances? N  Housekeeping or managing your Housekeeping? N  Some recent data might be hidden    Patient Care Team: Sunnie Nielsen, DO as PCP - General (Osteopathic Medicine) Hassan Rowan, MD (Family Medicine) Hassan Rowan, MD (Family Medicine)   Assessment:   This is  a routine wellness examination for Kostantinos.Physical assessment deferred to PCP.   Exercise Activities and Dietary recommendations Current Exercise Habits: The patient does not participate in regular exercise at present, Exercise limited by: cardiac condition(s);respiratory conditions(s) Diet  Eats a healthy diet with fruits vegetables and dairy daily. Breakfast: pancakes or egg sandwich, oatmeal Lunch: skip lunch has late breakfast Dinner:  Meat and vegetables or a salad or beans and potatoes.Drinks water daily at least a  quart daily. Patient does not check sugars at home per doctor per pt report.      Goals    . Exercise 3x per week (30 min per time)     Increase exercise as tolerated       Fall Risk Fall Risk  01/13/2018 12/01/2017 04/26/2016 06/10/2013  Falls in the past year? 0 - No No  Risk for fall due to : - Impaired balance/gait - -  Follow up Falls prevention discussed - - -   Is the patient's home free of loose throw rugs in walkways, pet beds, electrical cords, etc?   yes      Grab bars in the bathroom? yes      Handrails on the stairs?   no      Adequate lighting?   yes   Depression Screen PHQ 2/9 Scores 01/13/2018 12/01/2017 07/21/2017 10/30/2016  PHQ - 2 Score 0 0 0 0  PHQ- 9 Score - - - 0    Cognitive Function     6CIT Screen 01/13/2018  What Year? 0 points  What month? 0 points  What time? 0 points  Count back from 20 0 points  Months in reverse 0 points  Repeat phrase 0 points  Total Score 0    Immunization History  Administered Date(s) Administered  . Influenza Split 12/24/2011  . Influenza Whole 01/09/2009, 12/20/2009, 12/28/2010  . Influenza, High Dose Seasonal PF 11/02/2015, 10/30/2016, 12/01/2017  . Influenza,inj,Quad PF,6+ Mos 11/06/2012, 12/14/2013, 01/10/2015  . Pneumococcal Conjugate-13 03/16/2014  . Pneumococcal Polysaccharide-23 08/20/2011  . Td 12/10/2002  . Tdap 10/10/2015     Screening Tests Health Maintenance  Topic Date Due  . FOOT  EXAM  11/01/2016  . HEMOGLOBIN A1C  07/27/2017  . OPHTHALMOLOGY EXAM  04/25/2018  . TETANUS/TDAP  10/09/2025  . INFLUENZA VACCINE  Completed  . PNA vac Low Risk Adult  Completed         Plan:    Please schedule your next medicare wellness visit with me in 1 yr.  Mr. Suman , Thank you for taking time to come for your Medicare Wellness Visit. I appreciate your ongoing commitment to your health goals. Please review the following plan we discussed and let me know if I can assist you in the future.   Bring a copy of your living will and/or healthcare power of attorney to your next office visit.   These are the goals we discussed: Goals    . Exercise 3x per week (30 min per time)     Increase exercise as tolerated       This is a list of the screening recommended for you and due dates:  Health Maintenance  Topic Date Due  . Complete foot exam   11/01/2016  . Hemoglobin A1C  07/27/2017  . Eye exam for diabetics  04/25/2018  . Tetanus Vaccine  10/09/2025  . Flu Shot  Completed  . Pneumonia vaccines  Completed     I have personally reviewed and noted the following in the patient's chart:   . Medical and social history . Use of alcohol, tobacco or illicit drugs  . Current medications and supplements . Functional ability and status . Nutritional status . Physical activity . Advanced directives . List of other physicians . Hospitalizations, surgeries, and ER visits in previous 12 months . Vitals . Screenings to include cognitive, depression, and falls . Referrals and appointments  In addition, I have reviewed and discussed with patient certain preventive protocols,  quality metrics, and best practice recommendations. A written personalized care plan for preventive services as well as general preventive health recommendations were provided to patient.     Normand Sloop, LPN  [id number]

## 2018-01-13 ENCOUNTER — Ambulatory Visit (INDEPENDENT_AMBULATORY_CARE_PROVIDER_SITE_OTHER): Payer: Medicare Other | Admitting: *Deleted

## 2018-01-13 VITALS — BP 170/90 | HR 69 | Ht 68.0 in | Wt 151.0 lb

## 2018-01-13 DIAGNOSIS — Z0000 Encounter for general adult medical examination without abnormal findings: Secondary | ICD-10-CM | POA: Diagnosis not present

## 2018-01-13 NOTE — Patient Instructions (Addendum)
Please schedule your next medicare wellness visit with me in 1 yr.  Ryan Bernard , Thank you for taking time to come for your Medicare Wellness Visit. I appreciate your ongoing commitment to your health goals. Please review the following plan we discussed and let me know if I can assist you in the future.   Bring a copy of your living will and/or healthcare power of attorney to your next office visit. These are the goals we discussed: Goals    . Exercise 3x per week (30 min per time)     Increase exercise as tolerated     Health Maintenance, Male A healthy lifestyle and preventive care is important for your health and wellness. Ask your health care provider about what schedule of regular examinations is right for you. What should I know about weight and diet? Eat a Healthy Diet  Eat plenty of vegetables, fruits, whole grains, low-fat dairy products, and lean protein.  Do not eat a lot of foods high in solid fats, added sugars, or salt.  Maintain a Healthy Weight Regular exercise can help you achieve or maintain a healthy weight. You should:  Do at least 150 minutes of exercise each week. The exercise should increase your heart rate and make you sweat (moderate-intensity exercise).  Do strength-training exercises at least twice a week.  Watch Your Levels of Cholesterol and Blood Lipids  Have your blood tested for lipids and cholesterol every 5 years starting at 82 years of age. If you are at high risk for heart disease, you should start having your blood tested when you are 82 years old. You may need to have your cholesterol levels checked more often if: ? Your lipid or cholesterol levels are high. ? You are older than 82 years of age. ? You are at high risk for heart disease.  What should I know about cancer screening? Many types of cancers can be detected early and may often be prevented. Lung Cancer  You should be screened every year for lung cancer if: ? You are a current  smoker who has smoked for at least 30 years. ? You are a former smoker who has quit within the past 15 years.  Talk to your health care provider about your screening options, when you should start screening, and how often you should be screened.  Colorectal Cancer  Routine colorectal cancer screening usually begins at 82 years of age and should be repeated every 5-10 years until you are 82 years old. You may need to be screened more often if early forms of precancerous polyps or small growths are found. Your health care provider may recommend screening at an earlier age if you have risk factors for colon cancer.  Your health care provider may recommend using home test kits to check for hidden blood in the stool.  A small camera at the end of a tube can be used to examine your colon (sigmoidoscopy or colonoscopy). This checks for the earliest forms of colorectal cancer.  Prostate and Testicular Cancer  Depending on your age and overall health, your health care provider may do certain tests to screen for prostate and testicular cancer.  Talk to your health care provider about any symptoms or concerns you have about testicular or prostate cancer.  Skin Cancer  Check your skin from head to toe regularly.  Tell your health care provider about any new moles or changes in moles, especially if: ? There is a change in a mole's size,  shape, or color. ? You have a mole that is larger than a pencil eraser.  Always use sunscreen. Apply sunscreen liberally and repeat throughout the day.  Protect yourself by wearing long sleeves, pants, a wide-brimmed hat, and sunglasses when outside.  What should I know about heart disease, diabetes, and high blood pressure?  If you are 80-38 years of age, have your blood pressure checked every 3-5 years. If you are 60 years of age or older, have your blood pressure checked every year. You should have your blood pressure measured twice-once when you are at a  hospital or clinic, and once when you are not at a hospital or clinic. Record the average of the two measurements. To check your blood pressure when you are not at a hospital or clinic, you can use: ? An automated blood pressure machine at a pharmacy. ? A home blood pressure monitor.  Talk to your health care provider about your target blood pressure.  If you are between 40-32 years old, ask your health care provider if you should take aspirin to prevent heart disease.  Have regular diabetes screenings by checking your fasting blood sugar level. ? If you are at a normal weight and have a low risk for diabetes, have this test once every three years after the age of 32. ? If you are overweight and have a high risk for diabetes, consider being tested at a younger age or more often.  A one-time screening for abdominal aortic aneurysm (AAA) by ultrasound is recommended for men aged 65-75 years who are current or former smokers. What should I know about preventing infection? Hepatitis B If you have a higher risk for hepatitis B, you should be screened for this virus. Talk with your health care provider to find out if you are at risk for hepatitis B infection. Hepatitis C Blood testing is recommended for:  Everyone born from [birth year] through 1963.  Anyone with known risk factors for hepatitis C.  Sexually Transmitted Diseases (STDs)  You should be screened each year for STDs including gonorrhea and chlamydia if: ? You are sexually active and are younger than 82 years of age. ? You are older than 82 years of age and your health care provider tells you that you are at risk for this type of infection. ? Your sexual activity has changed since you were last screened and you are at an increased risk for chlamydia or gonorrhea. Ask your health care provider if you are at risk.  Talk with your health care provider about whether you are at high risk of being infected with HIV. Your health care provider may  recommend a prescription medicine to help prevent HIV infection.  What else can I do?  Schedule regular health, dental, and eye exams.  Stay current with your vaccines (immunizations).  Do not use any tobacco products, such as cigarettes, chewing tobacco, and e-cigarettes. If you need help quitting, ask your health care provider.  Limit alcohol intake to no more than 2 drinks per day. One drink equals 12 ounces of beer, 5 ounces of wine, or 1 ounces of hard liquor.  Do not use street drugs.  Do not share needles.  Ask your health care provider for help if you need support or information about quitting drugs.  Tell your health care provider if you often feel depressed.  Tell your health care provider if you have ever been abused or do not feel safe at home. This information is not intended  to replace advice given to you by your health care provider. Make sure you discuss any questions you have with your health care provider. Document Released: 08/24/2007 Document Revised: 10/25/2015 Document Reviewed: 11/29/2014 Elsevier Interactive Patient Education  2018 [employer].  Advised patient to take BP readings at home since BP was elevated, has white coat syndrome. Spoke with Dr. Lyn Hollingshead and she agrees with this plan and pt will bring log of BP readings to his appointment in December with his PCP. KG LPN

## 2018-03-02 ENCOUNTER — Encounter: Payer: Self-pay | Admitting: Osteopathic Medicine

## 2018-03-02 ENCOUNTER — Ambulatory Visit: Payer: Medicare Other | Admitting: Osteopathic Medicine

## 2018-03-02 VITALS — BP 163/67 | HR 72 | Temp 98.1°F | Wt 146.8 lb

## 2018-03-02 DIAGNOSIS — I5020 Unspecified systolic (congestive) heart failure: Secondary | ICD-10-CM

## 2018-03-02 DIAGNOSIS — N183 Chronic kidney disease, stage 3 unspecified: Secondary | ICD-10-CM

## 2018-03-02 DIAGNOSIS — I10 Essential (primary) hypertension: Secondary | ICD-10-CM | POA: Diagnosis not present

## 2018-03-02 DIAGNOSIS — Z951 Presence of aortocoronary bypass graft: Secondary | ICD-10-CM | POA: Diagnosis not present

## 2018-03-02 NOTE — Progress Notes (Addendum)
HPI: Ryan Bernard is a 84 y.o. male who  has a past medical history of Anemia, BPH (benign prostatic hyperplasia), CAD (coronary artery disease) (1995), Diabetes mellitus (2004), Heart disease, Hyperlipidemia, Hypertension, and SBE (subacute bacterial endocarditis) prophylaxis candidate.  [facility], 03/02/18,  for chief complaint of:  BP check, coughing   Increased Lasix last visit, advised use incentive spirometer. Improved CXR 3 days later.   Here today to f/u on BP.   He has not needed the Lasix since last visit.   Home BP monitor measured against ours today, he does have some white coat syndrome, home monitor fairly accurate.         At today's visit... Past medical history, surgical history, and family history reviewed and updated as needed.  Current medication list and allergy/intolerance information reviewed and updated as needed. (See remainder of HPI, ROS, Phys Exam below)           ASSESSMENT/PLAN: The primary encounter diagnosis was ACC/AHA stage C heart failure with reduced ejection fraction (HCC). Diagnoses of Essential hypertension, benign, Chronic kidney disease, stage 3 (HCC), and S/P CABG x 5 were also pertinent to this visit.    Orders Placed This Encounter  Procedures  . CBC  . COMPLETE METABOLIC PANEL WITH GFR  . Lipid panel  . Hemoglobin A1c  . TSH  . Magnesium       Patient Instructions  If your weight goes up by 5 pounds in 1-2 days, please start the Lasix (1-2 tablets twice a day) and call me!  Otherwise, keep taking what you're taking and we will see you in 3-4 months just to check up on you!      Follow-up plan: Return in about 4 months (around 07/02/2018) for monitor blood pressure, see me sooner if  needed.                             ############################################ ############################################ ############################################ ############################################    Current Meds  Medication Sig  . albuterol (PROVENTIL HFA;VENTOLIN HFA) 108 (90 Base) MCG/ACT inhaler Inhale 2 puffs into the lungs every 6 (six) hours as needed for wheezing.  . AMBULATORY NON FORMULARY MEDICATION Incentive spirometer, Disp #1, refill #1, Dx atelectasis  . aspirin 81 MG EC tablet Take 81 mg by mouth daily.   . benazepril (LOTENSIN) 40 MG tablet Take 40 mg by mouth daily.    . furosemide (LASIX) 40 MG tablet Take 1 tab PO daily prn for weight gain >=5 pounds  . metoprolol succinate (TOPROL-XL) 50 MG 24 hr tablet Take 1 tablet (50 mg total) by mouth daily. Take with or immediately following a meal. (Patient taking differently: Take 25 mg 2 (two) times daily by mouth. Take with or immediately following a meal.)  . pravastatin (PRAVACHOL) 80 MG tablet Take 1 tablet (80 mg total) by mouth at bedtime.  Marland Kitchen. terazosin (HYTRIN) 10 MG capsule Take 10 mg by mouth daily.      No Known Allergies     Review of Systems:  Constitutional: No recent illness  HEENT: No  headache, no vision change  Cardiac: No  chest pain, No  pressure, No palpitations  Respiratory:  No  shortness of breath. No  Cough  Gastrointestinal: No  abdominal pain, no change on bowel habits  Musculoskeletal: No new myalgia/arthralgia  Neurologic: No  weakness, No  Dizziness  Psychiatric: No  concerns with depression, No  concerns with anxiety  Exam:  BP (!) 163/67 (BP Location: Left Arm, Patient Position: Sitting, Cuff Size: Normal)   Pulse 72   Temp 98.1 F (36.7 C) (Oral)   Wt 146 lb 12.8 oz (66.6 kg)   BMI 22.32 kg/m   Constitutional: VS see above. General Appearance: alert, well-developed, well-nourished, NAD  Eyes: Normal lids and conjunctive,  non-icteric sclera  Ears, Nose, Mouth, Throat: MMM, Normal external inspection ears/nares/mouth/lips/gums.  Neck: No masses, trachea midline.   Respiratory: Normal respiratory effort. no wheeze, no rhonchi, no rales  Cardiovascular: S1/S2 normal, +3/6 murmur, no rub/gallop auscultated. RRR.   Musculoskeletal: Gait normal. Symmetric and independent movement of all extremities  Neurological: Normal balance/coordination. No tremor.  Skin: warm, dry, intact.   Psychiatric: Normal judgment/insight. Normal mood and affect. Oriented x3.       Visit summary with medication list and pertinent instructions was printed for patient to review, patient was advised to alert us if any updates are needed. All questions at time of visit were answered - patient instructed to contact office with any additional concerns. ER/RTC precautions were reviewed with the patient and understanding verbalized.      Please note: voice recognition software was used to produce this document, and typos may escape review. Please contact Dr. Lyn HollingsheadAlexander for any needed clarifications.    Follow up plan: Return in about 4 months (around 07/02/2018) for monitor blood pressure, see me sooner if needed.

## 2018-03-02 NOTE — Patient Instructions (Signed)
If your weight goes up by 5 pounds in 1-2 days, please start the Lasix (1-2 tablets twice a day) and call me!  Otherwise, keep taking what you're taking and we will see you in 3-4 months just to check up on you!

## 2018-03-05 DIAGNOSIS — R7989 Other specified abnormal findings of blood chemistry: Secondary | ICD-10-CM | POA: Diagnosis not present

## 2018-03-05 DIAGNOSIS — N183 Chronic kidney disease, stage 3 (moderate): Secondary | ICD-10-CM | POA: Diagnosis not present

## 2018-03-05 DIAGNOSIS — I5020 Unspecified systolic (congestive) heart failure: Secondary | ICD-10-CM | POA: Diagnosis not present

## 2018-03-05 DIAGNOSIS — I10 Essential (primary) hypertension: Secondary | ICD-10-CM | POA: Diagnosis not present

## 2018-03-06 ENCOUNTER — Encounter: Payer: Self-pay | Admitting: Osteopathic Medicine

## 2018-03-06 DIAGNOSIS — E039 Hypothyroidism, unspecified: Secondary | ICD-10-CM | POA: Insufficient documentation

## 2018-03-06 DIAGNOSIS — E038 Other specified hypothyroidism: Secondary | ICD-10-CM | POA: Insufficient documentation

## 2018-03-06 LAB — CBC
HCT: 40.7 % (ref 38.5–50.0)
Hemoglobin: 14.2 g/dL (ref 13.2–17.1)
MCH: 32.1 pg (ref 27.0–33.0)
MCHC: 34.9 g/dL (ref 32.0–36.0)
MCV: 92.1 fL (ref 80.0–100.0)
MPV: 11.1 fL (ref 7.5–12.5)
Platelets: 146 10*3/uL (ref 140–400)
RBC: 4.42 10*6/uL (ref 4.20–5.80)
RDW: 12.5 % (ref 11.0–15.0)
WBC: 5.4 10*3/uL (ref 3.8–10.8)

## 2018-03-06 LAB — COMPLETE METABOLIC PANEL WITH GFR
AG Ratio: 1.4 (calc) (ref 1.0–2.5)
ALT: 8 U/L — ABNORMAL LOW (ref 9–46)
AST: 15 U/L (ref 10–35)
Albumin: 4 g/dL (ref 3.6–5.1)
Alkaline phosphatase (APISO): 84 U/L (ref 40–115)
BUN: 19 mg/dL (ref 7–25)
CO2: 35 mmol/L — ABNORMAL HIGH (ref 20–32)
Calcium: 9.3 mg/dL (ref 8.6–10.3)
Chloride: 101 mmol/L (ref 98–110)
Creat: 1.08 mg/dL (ref 0.70–1.11)
GFR, Est African American: 73 mL/min/{1.73_m2} (ref >=60)
GFR, Est Non African American: 63 mL/min/{1.73_m2} (ref >=60)
Globulin: 2.9 g/dL (calc) (ref 1.9–3.7)
Glucose, Bld: 110 mg/dL — ABNORMAL HIGH (ref 65–99)
Potassium: 3.9 mmol/L (ref 3.5–5.3)
Sodium: 144 mmol/L (ref 135–146)
Total Bilirubin: 0.5 mg/dL (ref 0.2–1.2)
Total Protein: 6.9 g/dL (ref 6.1–8.1)

## 2018-03-06 LAB — TEST AUTHORIZATION

## 2018-03-06 LAB — HEMOGLOBIN A1C
Hgb A1c MFr Bld: 6 % of total Hgb — ABNORMAL HIGH (ref <5.7)
Mean Plasma Glucose: 126 (calc)
eAG (mmol/L): 7 (calc)

## 2018-03-06 LAB — LIPID PANEL
CHOL/HDL RATIO: 3.2 (calc) (ref <5.0)
Cholesterol: 148 mg/dL (ref <200)
HDL: 46 mg/dL (ref >40)
LDL Cholesterol (Calc): 83 mg/dL (calc)
NON-HDL CHOLESTEROL (CALC): 102 mg/dL (ref <130)
Triglycerides: 97 mg/dL (ref <150)

## 2018-03-06 LAB — TSH: TSH: 8.33 m[IU]/L — AB (ref 0.40–4.50)

## 2018-03-06 LAB — MAGNESIUM: MAGNESIUM: 1.6 mg/dL (ref 1.5–2.5)

## 2018-03-06 LAB — T4, FREE: Free T4: 1 ng/dL (ref 0.8–1.8)

## 2018-05-01 ENCOUNTER — Encounter: Payer: Self-pay | Admitting: Osteopathic Medicine

## 2018-05-01 DIAGNOSIS — H524 Presbyopia: Secondary | ICD-10-CM | POA: Diagnosis not present

## 2018-07-02 ENCOUNTER — Ambulatory Visit (INDEPENDENT_AMBULATORY_CARE_PROVIDER_SITE_OTHER): Payer: Medicare Other | Admitting: Osteopathic Medicine

## 2018-07-02 ENCOUNTER — Encounter: Payer: Self-pay | Admitting: Osteopathic Medicine

## 2018-07-02 VITALS — BP 147/83 | HR 78 | Temp 97.7°F | Wt 149.9 lb

## 2018-07-02 DIAGNOSIS — I10 Essential (primary) hypertension: Secondary | ICD-10-CM

## 2018-07-02 DIAGNOSIS — I4891 Unspecified atrial fibrillation: Secondary | ICD-10-CM | POA: Diagnosis not present

## 2018-07-02 DIAGNOSIS — I5020 Unspecified systolic (congestive) heart failure: Secondary | ICD-10-CM | POA: Diagnosis not present

## 2018-07-02 DIAGNOSIS — E039 Hypothyroidism, unspecified: Secondary | ICD-10-CM

## 2018-07-02 DIAGNOSIS — E1122 Type 2 diabetes mellitus with diabetic chronic kidney disease: Secondary | ICD-10-CM

## 2018-07-02 DIAGNOSIS — I499 Cardiac arrhythmia, unspecified: Secondary | ICD-10-CM | POA: Diagnosis not present

## 2018-07-02 DIAGNOSIS — N183 Chronic kidney disease, stage 3 unspecified: Secondary | ICD-10-CM

## 2018-07-02 DIAGNOSIS — E038 Other specified hypothyroidism: Secondary | ICD-10-CM

## 2018-07-02 MED ORDER — APIXABAN 2.5 MG PO TABS: 2.5000 mg | ORAL_TABLET | Freq: Two times a day (BID) | ORAL | 2 refills | Status: DC

## 2018-07-02 NOTE — Progress Notes (Signed)
HPI: Ryan Bernard is a 85 y.o. male who  has a past medical history of Anemia, BPH (benign prostatic hyperplasia), CAD (coronary artery disease) (1995), Diabetes mellitus (2004), Heart disease, Hyperlipidemia, Hypertension, and SBE (subacute bacterial endocarditis) prophylaxis candidate.  he presents to [facility] today, 07/02/18,  for chief complaint of:  BP and CHF follow-up   Doing well, no concerns today Home BP systolic consistently 120's   Patient is accompanied by son who assists with history-taking.    At today's visit 07/02/18 ... PMH, PSH, FH reviewed and updated as needed.  Current medication list and allergy/intolerance hx reviewed and updated as needed. (See remainder of HPI, ROS, Phys Exam below)   EKG interpretation: Rate: irregular Rhythm: irregular - afib, no RVR Incomplete LBBB No ST/T changes concerning for acute ischemia/infarct  Previous EKG tracing form 2011, NSR        ASSESSMENT/PLAN: The primary encounter diagnosis was Atrial fibrillation, unspecified type (HCC). Diagnoses of Subclinical hypothyroidism, Type 2 diabetes mellitus with stage 3 chronic kidney disease, without long-term current use of insulin (HCC), Essential hypertension, benign, ACC/AHA stage C heart failure with reduced ejection fraction (HCC), and Irregular heart beat were also pertinent to this visit.   CHADS2VASC 6 = 9.7-13% risk CVA/TIA/embolism   Rx for Eliquis, pt will take to [facility] or [place], has cardiology appt upcoming in a few months. Put on lower dose since >80, pt does have vertigo, no falls, will D/C ASA until can get recs from cardio   Orders Placed This Encounter  Procedures  . TSH  . T4, free  . Hemoglobin A1c  . BASIC METABOLIC PANEL WITH GFR  . EKG 12-Lead  . Rhythm ECG, report     Meds ordered this encounter  Medications  . apixaban (ELIQUIS) 2.5 MG TABS tablet    Sig: Take 1 tablet (2.5 mg total) by mouth 2 (two) times  daily.    Dispense:  60 tablet    Refill:  2    If cost an issue please advise if Xarelto or Coumadin are preferred, thanks!    Patient Instructions  Plan: Will try starting Elquis Stop Aspirin for now Call cardiology, tell them you've been diagnosed with A-Fib, they might want to see you sooner than July.  Lab work today!    Atrial Fibrillation Atrial fibrillation is a type of irregular or rapid heartbeat (arrhythmia). In atrial fibrillation, the top part of the heart (atria) quivers in a chaotic pattern. This makes the heart unable to pump blood normally. Having atrial fibrillation can increase your risk for other health problems, such as:  Blood can pool in the atria and form clots. If a clot travels to the brain, it can cause a stroke.  The heart muscle may weaken from the irregular blood flow. This can cause heart failure. Atrial fibrillation may start suddenly and stop on its own, or it may become a long-lasting problem. What are the causes? This condition is caused by some heart-related conditions or procedures, including:  High blood pressure. This is the most common cause.  Heart failure.  Heart valve conditions.  Inflammation of the sac that surrounds the heart (pericarditis).  Heart surgery.  Coronary artery disease.  Certain heart rhythm disorders, such as Wolf-Parkinson-White syndrome. Other causes include:  Pneumonia.  Obstructive sleep apnea.  Lung cancer.  Thyroid problems, especially if the thyroid is overactive (hyperthyroidism).  Excessive alcohol or drug use. Sometimes, the cause of this condition is not known. What  increases the risk? This condition is more likely to develop in:  Older people.  People who smoke.  People who have diabetes mellitus.  People who are overweight (obese).  Athletes who exercise vigorously.  People who have a family history. What are the signs or symptoms? Symptoms of this condition include:  A feeling  that your heart is beating rapidly or irregularly.  A feeling of discomfort or pain in your chest.  Shortness of breath.  Sudden light-headedness or weakness.  Getting tired easily during exercise. In some cases, there are no symptoms. How is this diagnosed? Your health care provider may be able to detect atrial fibrillation when taking your pulse. If detected, this condition may be diagnosed with:  Electrocardiogram (ECG).  Ambulatory cardiac monitor. This device records your heartbeats for 24 hours or more.  Transthoracic echocardiogram (TTE) to evaluate how blood flows through your heart.  Transesophageal echocardiogram (TEE) to view more detailed images of your heart.  A stress test.  Imaging tests, such as a CT scan or chest X-ray.  Blood tests. How is this treated? This condition may be treated with:  Medicines to slow down the heart rate or bring the heart's rhythm back to normal.  Medicines to prevent blood clots from forming.  Electrical cardioversion. This delivers a low-energy shock to the heart to reset its rhythm.  Ablation. This procedure destroys the part of the heart tissue that sends abnormal signals.  Left atrial appendage occlusion/excision. This seals off a common place in the atria where blood clots can form (left atrial appendage). The goal of treatment is to prevent blood clots from forming and to keep your heart beating at a normal rate and rhythm. Treatment depends on underlying medical conditions and how you feel when you are experiencing fibrillation. Follow these instructions at home: Medicines  Take over-the counter and prescription medicines only as told by your health care provider.  If your health care provider prescribed a blood-thinning medicine (anticoagulant), take it exactly as told. Taking too much blood-thinning medicine can cause bleeding. Taking too little can enable a blood clot to form and travel to the brain, causing a stroke.  Lifestyle      Do not use any products that contain nicotine or tobacco, such as cigarettes and e-cigarettes. If you need help quitting, ask your health care provider.  Do not drink beverages that contain caffeine, such as coffee, soda, and tea.  Follow diet instructions as told by your health care provider.  Exercise regularly as told by your health care provider.  Do not drink alcohol. General instructions  If you have obstructive sleep apnea, manage your condition as told by your health care provider.  Maintain a healthy weight. Do not use diet pills unless your health care provider approves. Diet pills may make heart problems worse.  Keep all follow-up visits as told by your health care provider. This is important. Contact a health care provider if you:  Notice a change in the rate, rhythm, or strength of your heartbeat.  Are taking an anticoagulant and you notice increased bruising.  Tire more easily when you exercise or exert yourself.  Have a sudden change in weight. Get help right away if you have:   Chest pain, abdominal pain, sweating, or weakness.  Difficulty breathing.  Blood in your vomit, stool (feces), or urine.  Any symptoms of a stroke. "BE FAST" is an easy way to remember the main warning signs of a stroke: ? B - Balance. Signs  are dizziness, sudden trouble walking, or loss of balance. ? E - Eyes. Signs are trouble seeing or a sudden change in vision. ? F - Face. Signs are sudden weakness or numbness of the face, or the face or eyelid drooping on one side. ? A - Arms. Signs are weakness or numbness in an arm. This happens suddenly and usually on one side of the body. ? S - Speech. Signs are sudden trouble speaking, slurred speech, or trouble understanding what people say. ? T - Time. Time to call emergency services. Write down what time symptoms started.  Other signs of a stroke, such as: ? A sudden, severe headache with no known cause. ? Nausea or  vomiting. ? Seizure. These symptoms may represent a serious problem that is an emergency. Do not wait to see if the symptoms will go away. Get medical help right away. Call your local emergency services (911 in the [place] Do not drive yourself to the hospital. Summary  Atrial fibrillation is a type of irregular or rapid heartbeat (arrhythmia).  Symptoms include a feeling that your heart is beating fast or irregularly. In some cases, you may not have symptoms.  The condition is treated with medicines to slow down the heart rate or bring the heart's rhythm back to normal. You may also need blood-thinning medicines to prevent blood clots.  Get help right away if you have symptoms or signs of a stroke. This information is not intended to replace advice given to you by your health care provider. Make sure you discuss any questions you have with your health care provider. Document Released: 02/25/2005 Document Revised: 04/18/2017 Document Reviewed: 04/18/2017 Elsevier Interactive Patient Education  2019 [employer].           Follow-up plan: Return in about 4 months (around 11/01/2018) for follow up on blood pressure +/- lab work .                                                 ################################################# ################################################# ################################################# #################################################    Current Meds  Medication Sig  . albuterol (PROVENTIL HFA;VENTOLIN HFA) 108 (90 Base) MCG/ACT inhaler Inhale 2 puffs into the lungs every 6 (six) hours as needed for wheezing.  . AMBULATORY NON FORMULARY MEDICATION Incentive spirometer, Disp #1, refill #1, Dx atelectasis  . benazepril (LOTENSIN) 40 MG tablet Take 40 mg by mouth daily.    . furosemide (LASIX) 40 MG tablet Take 1 tab PO daily prn for weight gain >=5 pounds  . metoprolol succinate (TOPROL-XL) 50 MG  24 hr tablet Take 1 tablet (50 mg total) by mouth daily. Take with or immediately following a meal. (Patient taking differently: Take 25 mg 2 (two) times daily by mouth. Take with or immediately following a meal.)  . pravastatin (PRAVACHOL) 80 MG tablet Take 1 tablet (80 mg total) by mouth at bedtime.  Marland Kitchen terazosin (HYTRIN) 10 MG capsule Take 10 mg by mouth daily.    . [DISCONTINUED] aspirin 81 MG EC tablet Take 81 mg by mouth daily.     No Known Allergies     Review of Systems:  Constitutional: No recent illness  HEENT: No  headache, no vision change  Cardiac: No  chest pain, No  pressure, No palpitations  Respiratory:  No  shortness of breath. No  Cough, occasional wheezing  Gastrointestinal: No  abdominal pain, no change on bowel habits  Musculoskeletal: No new myalgia/arthralgia  Skin: No  Rash  Hem/Onc: +easy bruising  Neurologic: No  weakness, No  Dizziness   Exam:  BP (!) 147/83 (BP Location: Left Arm, Patient Position: Sitting, Cuff Size: Normal)   Pulse 78   Temp 97.7 F (36.5 C) (Oral)   Wt 149 lb 14.4 oz (68 kg)   BMI 22.79 kg/m   Constitutional: VS see above. General Appearance: alert, well-developed, well-nourished, NAD  Eyes: Normal lids and conjunctive, non-icteric sclera  Ears, Nose, Mouth, Throat: MMM, Normal external inspection ears/nares/mouth/lips/gums.  Neck: No masses, trachea midline.   Respiratory: Normal respiratory effort. no wheeze, no rhonchi, no rales  Cardiovascular: S1/S2 normal, +murmur, no rub/gallop auscultated. Reg rate, irreg/irreg  Musculoskeletal: Gait normal. Symmetric and independent movement of all extremities  Abdominal: non-tender, non-distended, no appreciable organomegaly, neg Murphy's, BS WNLx4  Neurological: Normal balance/coordination. No tremor.  Skin: warm, dry, intact.   Psychiatric: Normal judgment/insight. Normal mood and affect. Oriented x3.       Visit summary with medication list and pertinent  instructions was printed for patient to review, patient was advised to alert [place] if any updates are needed. All questions at time of visit were answered - patient instructed to contact office with any additional concerns. ER/RTC precautions were reviewed with the patient and understanding verbalized.   Note: Total time spent 25 minutes, greater than 50% of the visit was spent face-to-face counseling and coordinating care for the following: The primary encounter diagnosis was Atrial fibrillation, unspecified type (HCC). Diagnoses of Subclinical hypothyroidism, Type 2 diabetes mellitus with stage 3 chronic kidney disease, without long-term current use of insulin (HCC), Essential hypertension, benign, ACC/AHA stage C heart failure with reduced ejection fraction (HCC), and Irregular heart beat were also pertinent to this visit.Marland Kitchen  Please note: voice recognition software was used to produce this document, and typos may escape review. Please contact Dr. Lyn Hollingshead for any needed clarifications.    Follow up plan: Return in about 4 months (around 11/01/2018) for follow up on blood pressure +/- lab work .

## 2018-07-02 NOTE — Patient Instructions (Signed)
Plan: Will try starting Elquis Stop Aspirin for now Call cardiology, tell them you've been diagnosed with A-Fib, they might want to see you sooner than July.  Lab work today!    Atrial Fibrillation Atrial fibrillation is a type of irregular or rapid heartbeat (arrhythmia). In atrial fibrillation, the top part of the heart (atria) quivers in a chaotic pattern. This makes the heart unable to pump blood normally. Having atrial fibrillation can increase your risk for other health problems, such as:  Blood can pool in the atria and form clots. If a clot travels to the brain, it can cause a stroke.  The heart muscle may weaken from the irregular blood flow. This can cause heart failure. Atrial fibrillation may start suddenly and stop on its own, or it may become a long-lasting problem. What are the causes? This condition is caused by some heart-related conditions or procedures, including:  High blood pressure. This is the most common cause.  Heart failure.  Heart valve conditions.  Inflammation of the sac that surrounds the heart (pericarditis).  Heart surgery.  Coronary artery disease.  Certain heart rhythm disorders, such as Wolf-Parkinson-White syndrome. Other causes include:  Pneumonia.  Obstructive sleep apnea.  Lung cancer.  Thyroid problems, especially if the thyroid is overactive (hyperthyroidism).  Excessive alcohol or drug use. Sometimes, the cause of this condition is not known. What increases the risk? This condition is more likely to develop in:  Older people.  People who smoke.  People who have diabetes mellitus.  People who are overweight (obese).  Athletes who exercise vigorously.  People who have a family history. What are the signs or symptoms? Symptoms of this condition include:  A feeling that your heart is beating rapidly or irregularly.  A feeling of discomfort or pain in your chest.  Shortness of breath.  Sudden light-headedness or  weakness.  Getting tired easily during exercise. In some cases, there are no symptoms. How is this diagnosed? Your health care provider may be able to detect atrial fibrillation when taking your pulse. If detected, this condition may be diagnosed with:  Electrocardiogram (ECG).  Ambulatory cardiac monitor. This device records your heartbeats for 24 hours or more.  Transthoracic echocardiogram (TTE) to evaluate how blood flows through your heart.  Transesophageal echocardiogram (TEE) to view more detailed images of your heart.  A stress test.  Imaging tests, such as a CT scan or chest X-ray.  Blood tests. How is this treated? This condition may be treated with:  Medicines to slow down the heart rate or bring the heart's rhythm back to normal.  Medicines to prevent blood clots from forming.  Electrical cardioversion. This delivers a low-energy shock to the heart to reset its rhythm.  Ablation. This procedure destroys the part of the heart tissue that sends abnormal signals.  Left atrial appendage occlusion/excision. This seals off a common place in the atria where blood clots can form (left atrial appendage). The goal of treatment is to prevent blood clots from forming and to keep your heart beating at a normal rate and rhythm. Treatment depends on underlying medical conditions and how you feel when you are experiencing fibrillation. Follow these instructions at home: Medicines  Take over-the counter and prescription medicines only as told by your health care provider.  If your health care provider prescribed a blood-thinning medicine (anticoagulant), take it exactly as told. Taking too much blood-thinning medicine can cause bleeding. Taking too little can enable a blood clot to form and travel to  the brain, causing a stroke. Lifestyle      Do not use any products that contain nicotine or tobacco, such as cigarettes and e-cigarettes. If you need help quitting, ask your  health care provider.  Do not drink beverages that contain caffeine, such as coffee, soda, and tea.  Follow diet instructions as told by your health care provider.  Exercise regularly as told by your health care provider.  Do not drink alcohol. General instructions  If you have obstructive sleep apnea, manage your condition as told by your health care provider.  Maintain a healthy weight. Do not use diet pills unless your health care provider approves. Diet pills may make heart problems worse.  Keep all follow-up visits as told by your health care provider. This is important. Contact a health care provider if you:  Notice a change in the rate, rhythm, or strength of your heartbeat.  Are taking an anticoagulant and you notice increased bruising.  Tire more easily when you exercise or exert yourself.  Have a sudden change in weight. Get help right away if you have:   Chest pain, abdominal pain, sweating, or weakness.  Difficulty breathing.  Blood in your vomit, stool (feces), or urine.  Any symptoms of a stroke. "BE FAST" is an easy way to remember the main warning signs of a stroke: ? B - Balance. Signs are dizziness, sudden trouble walking, or loss of balance. ? E - Eyes. Signs are trouble seeing or a sudden change in vision. ? F - Face. Signs are sudden weakness or numbness of the face, or the face or eyelid drooping on one side. ? A - Arms. Signs are weakness or numbness in an arm. This happens suddenly and usually on one side of the body. ? S - Speech. Signs are sudden trouble speaking, slurred speech, or trouble understanding what people say. ? T - Time. Time to call emergency services. Write down what time symptoms started.  Other signs of a stroke, such as: ? A sudden, severe headache with no known cause. ? Nausea or vomiting. ? Seizure. These symptoms may represent a serious problem that is an emergency. Do not wait to see if the symptoms will go away. Get medical  help right away. Call your local emergency services (911 in the [place] Do not drive yourself to the hospital. Summary  Atrial fibrillation is a type of irregular or rapid heartbeat (arrhythmia).  Symptoms include a feeling that your heart is beating fast or irregularly. In some cases, you may not have symptoms.  The condition is treated with medicines to slow down the heart rate or bring the heart's rhythm back to normal. You may also need blood-thinning medicines to prevent blood clots.  Get help right away if you have symptoms or signs of a stroke. This information is not intended to replace advice given to you by your health care provider. Make sure you discuss any questions you have with your health care provider. Document Released: 02/25/2005 Document Revised: 04/18/2017 Document Reviewed: 04/18/2017 Elsevier Interactive Patient Education  2019 [employer].

## 2018-07-03 LAB — BASIC METABOLIC PANEL WITH GFR
BUN/Creatinine Ratio: 13 (calc) (ref 6–22)
BUN: 17 mg/dL (ref 7–25)
CO2: 30 mmol/L (ref 20–32)
Calcium: 9.3 mg/dL (ref 8.6–10.3)
Chloride: 100 mmol/L (ref 98–110)
Creat: 1.26 mg/dL — ABNORMAL HIGH (ref 0.70–1.11)
GFR, Est African American: 60 mL/min/{1.73_m2} (ref >=60)
GFR, Est Non African American: 52 mL/min/{1.73_m2} — ABNORMAL LOW (ref >=60)
Glucose, Bld: 105 mg/dL — ABNORMAL HIGH (ref 65–99)
Potassium: 4 mmol/L (ref 3.5–5.3)
Sodium: 141 mmol/L (ref 135–146)

## 2018-07-03 LAB — T4, FREE: Free T4: 1.1 ng/dL (ref 0.8–1.8)

## 2018-07-03 LAB — HEMOGLOBIN A1C
Hgb A1c MFr Bld: 5.8 % of total Hgb — ABNORMAL HIGH (ref <5.7)
Mean Plasma Glucose: 120 (calc)
eAG (mmol/L): 6.6 (calc)

## 2018-07-03 LAB — TSH: TSH: 9.94 mIU/L — ABNORMAL HIGH (ref 0.40–4.50)

## 2018-07-07 DIAGNOSIS — I10 Essential (primary) hypertension: Secondary | ICD-10-CM | POA: Diagnosis not present

## 2018-07-07 DIAGNOSIS — I2510 Atherosclerotic heart disease of native coronary artery without angina pectoris: Secondary | ICD-10-CM | POA: Diagnosis not present

## 2018-07-07 DIAGNOSIS — I340 Nonrheumatic mitral (valve) insufficiency: Secondary | ICD-10-CM | POA: Diagnosis not present

## 2018-07-07 DIAGNOSIS — I4891 Unspecified atrial fibrillation: Secondary | ICD-10-CM | POA: Diagnosis not present

## 2018-08-04 DIAGNOSIS — I4819 Other persistent atrial fibrillation: Secondary | ICD-10-CM | POA: Diagnosis not present

## 2018-08-04 DIAGNOSIS — I10 Essential (primary) hypertension: Secondary | ICD-10-CM | POA: Diagnosis not present

## 2018-08-04 DIAGNOSIS — I2510 Atherosclerotic heart disease of native coronary artery without angina pectoris: Secondary | ICD-10-CM | POA: Diagnosis not present

## 2018-08-04 DIAGNOSIS — I4891 Unspecified atrial fibrillation: Secondary | ICD-10-CM | POA: Diagnosis not present

## 2018-08-04 DIAGNOSIS — Z951 Presence of aortocoronary bypass graft: Secondary | ICD-10-CM | POA: Diagnosis not present

## 2018-08-04 DIAGNOSIS — I447 Left bundle-branch block, unspecified: Secondary | ICD-10-CM | POA: Diagnosis not present

## 2018-08-05 DIAGNOSIS — I4819 Other persistent atrial fibrillation: Secondary | ICD-10-CM | POA: Diagnosis not present

## 2018-08-05 DIAGNOSIS — I2510 Atherosclerotic heart disease of native coronary artery without angina pectoris: Secondary | ICD-10-CM | POA: Diagnosis not present

## 2018-08-05 DIAGNOSIS — Z951 Presence of aortocoronary bypass graft: Secondary | ICD-10-CM | POA: Diagnosis not present

## 2018-08-11 DIAGNOSIS — I083 Combined rheumatic disorders of mitral, aortic and tricuspid valves: Secondary | ICD-10-CM | POA: Diagnosis not present

## 2018-08-11 DIAGNOSIS — I77810 Thoracic aortic ectasia: Secondary | ICD-10-CM | POA: Diagnosis not present

## 2018-08-11 DIAGNOSIS — I2510 Atherosclerotic heart disease of native coronary artery without angina pectoris: Secondary | ICD-10-CM | POA: Diagnosis not present

## 2018-08-11 DIAGNOSIS — I4811 Longstanding persistent atrial fibrillation: Secondary | ICD-10-CM | POA: Diagnosis not present

## 2018-08-11 DIAGNOSIS — I5189 Other ill-defined heart diseases: Secondary | ICD-10-CM | POA: Diagnosis not present

## 2018-08-11 DIAGNOSIS — I517 Cardiomegaly: Secondary | ICD-10-CM | POA: Diagnosis not present

## 2018-08-11 DIAGNOSIS — I4819 Other persistent atrial fibrillation: Secondary | ICD-10-CM | POA: Diagnosis not present

## 2018-08-11 DIAGNOSIS — Z951 Presence of aortocoronary bypass graft: Secondary | ICD-10-CM | POA: Diagnosis not present

## 2018-08-14 DIAGNOSIS — Z951 Presence of aortocoronary bypass graft: Secondary | ICD-10-CM | POA: Diagnosis not present

## 2018-08-14 DIAGNOSIS — I2510 Atherosclerotic heart disease of native coronary artery without angina pectoris: Secondary | ICD-10-CM | POA: Diagnosis not present

## 2018-08-14 DIAGNOSIS — I4819 Other persistent atrial fibrillation: Secondary | ICD-10-CM | POA: Diagnosis not present

## 2018-10-05 DIAGNOSIS — I4819 Other persistent atrial fibrillation: Secondary | ICD-10-CM | POA: Diagnosis not present

## 2018-10-05 DIAGNOSIS — I10 Essential (primary) hypertension: Secondary | ICD-10-CM | POA: Diagnosis not present

## 2018-10-05 DIAGNOSIS — I255 Ischemic cardiomyopathy: Secondary | ICD-10-CM | POA: Diagnosis not present

## 2018-11-02 ENCOUNTER — Encounter: Payer: Self-pay | Admitting: Osteopathic Medicine

## 2018-11-02 ENCOUNTER — Other Ambulatory Visit: Payer: Self-pay

## 2018-11-02 ENCOUNTER — Ambulatory Visit (INDEPENDENT_AMBULATORY_CARE_PROVIDER_SITE_OTHER): Payer: Medicare Other | Admitting: Osteopathic Medicine

## 2018-11-02 VITALS — BP 155/86 | HR 76 | Temp 97.9°F | Wt 134.1 lb

## 2018-11-02 DIAGNOSIS — I952 Hypotension due to drugs: Secondary | ICD-10-CM

## 2018-11-02 DIAGNOSIS — I10 Essential (primary) hypertension: Secondary | ICD-10-CM

## 2018-11-02 DIAGNOSIS — R7303 Prediabetes: Secondary | ICD-10-CM | POA: Diagnosis not present

## 2018-11-02 DIAGNOSIS — Z23 Encounter for immunization: Secondary | ICD-10-CM

## 2018-11-02 DIAGNOSIS — Z951 Presence of aortocoronary bypass graft: Secondary | ICD-10-CM

## 2018-11-02 DIAGNOSIS — I5020 Unspecified systolic (congestive) heart failure: Secondary | ICD-10-CM | POA: Diagnosis not present

## 2018-11-02 LAB — POCT GLYCOSYLATED HEMOGLOBIN (HGB A1C): Hemoglobin A1C: 6.3 % — AB (ref 4.0–5.6)

## 2018-11-02 NOTE — Patient Instructions (Signed)
Plan:   Cut the Losartan from 100 mg to 50 mg (half a tablet) and take this once per day  Call me [phone]) this Thursday and let me know if you're still having dizzy spells and/or low blood pressure!

## 2018-11-02 NOTE — Progress Notes (Signed)
HPI: Ryan Bernard is a 85 y.o. male who  has a past medical history of Anemia, BPH (benign prostatic hyperplasia), CAD (coronary artery disease) (1995), Diabetes mellitus (2004), Heart disease, Hyperlipidemia, Hypertension, and SBE (subacute bacterial endocarditis) prophylaxis candidate.  he presents to [facility] [place] today, 11/02/18,  for chief complaint of:  Follow-up BP   Atrial fibrillation, ischemic cardiomyopathy:  Most recent visit with cardiology 10/05/2018.  Changed benazepril to losartan 100 mg daily, increase Toprol-XL from 50 to 100 mg daily, advised return in 4 to 6 weeks.  Labs at that time showed slightly decreased potassium at 3.4, creatinine relatively stable at 1.3, elevated NT proBNP at 4784, CBC ok.  Vital signs at that time blood pressure 136/60, heart rate 71.  Eliquis 5 mg bid not 2.5 Pravastatin 40 mg not 80  BP: Patient reports blood pressure is occasionally dropping, he is measuring 80s over 50s at home and when this happens he is getting quite dizzy.  No falling, no loss of consciousness, no syncope/presyncope  Prediabetes: A1C have been stable for years. Never above 6.3%  Weight has gone down a bit over the last year or so: Patient reports he feels fine with good energy, not particularly worried about the weight loss, may also be fluctuating due to history of heart failure. Wt Readings from Last 3 Encounters:  11/02/18 134 lb 1.6 oz (60.8 kg)  07/02/18 149 lb 14.4 oz (68 kg)  03/02/18 146 lb 12.8 oz (66.6 kg)      At today's visit 11/02/18 ... PMH, PSH, FH reviewed and updated as needed.  Current medication list and allergy/intolerance hx reviewed and updated as needed. (See remainder of HPI, ROS, Phys Exam below)   No results found.  Results for orders placed or performed in visit on 11/02/18 (from the past 72 hour(s))  POCT HgB A1C     Status: Abnormal   Collection Time: 11/02/18  8:31 AM  Result Value Ref Range   Hemoglobin A1C 6.3 (A) 4.0 - 5.6 %   HbA1c POC (<> result, manual entry)     HbA1c, POC (prediabetic range)     HbA1c, POC (controlled diabetic range)            ASSESSMENT/PLAN: The primary encounter diagnosis was Hypotension due to drugs. Diagnoses of Need for influenza vaccination, Prediabetes, ACC/AHA stage C heart failure with reduced ejection fraction (HCC), Essential hypertension, benign, and S/P CABG x 5 were also pertinent to this visit.  Patient has appointment upcoming with cardiology, he is welcome to follow-up with me if needed but they can of course check blood pressure there.  Patient to contact me later this week with update on symptoms on reduced dose of antihypertensives.   Orders Placed This Encounter  Procedures  . POCT HgB A1C     No orders of the defined types were placed in this encounter.   Patient Instructions  Plan:   Cut the Losartan from 100 mg to 50 mg (half a tablet) and take this once per day  Call me [phone]) this Thursday and let me know if you're still having dizzy spells and/or low blood pressure!        Follow-up plan: Return in about 6 months (around 05/05/2019) for MEDICARE VISIT W/ NURSE, ANNUAL PHYSICAL W/ DR Lyn HollingsheadALEXANDER. Call us sooner if needed! .                                                 ################################################# ################################################# ################################################# #################################################  Current Meds  Medication Sig  . albuterol (PROVENTIL HFA;VENTOLIN HFA) 108 (90 Base) MCG/ACT inhaler Inhale 2 puffs into the lungs every 6 (six) hours as needed for wheezing.  . AMBULATORY NON FORMULARY MEDICATION Incentive spirometer, Disp #1, refill #1, Dx atelectasis  . apixaban (ELIQUIS) 5 MG TABS tablet Take 5 mg by mouth 2 (two) times daily.  . furosemide (LASIX) 40 MG tablet  Take 1 tab PO daily prn for weight gain >=5 pounds  . losartan (COZAAR) 100 MG tablet Take 50 mg by mouth daily.  . metoprolol succinate (TOPROL-XL) 100 MG 24 hr tablet Take 100 mg by mouth daily. Take with or immediately following a meal.  . pravastatin (PRAVACHOL) 80 MG tablet Take 1 tablet (80 mg total) by mouth at bedtime.  Marland Kitchen terazosin (HYTRIN) 10 MG capsule Take 10 mg by mouth daily.      No Known Allergies     Review of Systems:  Constitutional: No recent illness  HEENT: No  headache, no vision change  Cardiac: No  chest pain, No  pressure, No palpitations, no orthopnea, no lower extremity edema  Respiratory:  No  shortness of breath. No  Cough  Gastrointestinal: No  abdominal pain  Musculoskeletal: No new myalgia/arthralgia  Skin: No  Rash  Neurologic: No  weakness, +Dizziness  Psychiatric: No  concerns with depression, No  concerns with anxiety  Exam:  BP (!) 155/86 (BP Location: Left Arm, Patient Position: Sitting, Cuff Size: Normal)   Pulse 76   Temp 97.9 F (36.6 C) (Oral)   Wt 134 lb 1.6 oz (60.8 kg)   BMI 20.39 kg/m   Constitutional: VS see above. General Appearance: alert, well-developed, well-nourished, NAD  Eyes: Normal lids and conjunctive, non-icteric sclera  Ears, Nose, Mouth, Throat: MMM, Normal external inspection ears/nares/mouth/lips/gums.  Neck: No masses, trachea midline.   Respiratory: Normal respiratory effort. no wheeze, no rhonchi, no rales  Cardiovascular: S1/S2 normal, +murmur, no rub/gallop auscultated. irreg irreg rhythm, rate ok. No LE edema.   Musculoskeletal: Gait normal.   Neurological: Normal balance/coordination. No tremor.  Skin: warm, dry, intact.   Psychiatric: Normal judgment/insight. Normal mood and affect. Oriented x3.       Visit summary with medication list and pertinent instructions was printed for patient to review, patient was advised to alert [place] if any updates are needed. All questions at time of visit  were answered - patient instructed to contact office with any additional concerns. ER/RTC precautions were reviewed with the patient and understanding verbalized.   Note: Total time spent 25 minutes, greater than 50% of the visit was spent face-to-face counseling and coordinating care for the following: The primary encounter diagnosis was Hypotension due to drugs. Diagnoses of Need for influenza vaccination, Prediabetes, ACC/AHA stage C heart failure with reduced ejection fraction ([place]), Essential hypertension, benign, and S/P CABG x 5 were also pertinent to this visit.Marland Kitchen  Please note: voice recognition software was used to produce this document, and typos may escape review. Please contact Dr. Sheppard Coil for any needed clarifications.    Follow up plan: Return in about 6 months (around 05/05/2019) for MEDICARE VISIT W/ NURSE, ANNUAL PHYSICAL W/ DR Sheppard Coil. Call us sooner if needed! Marland Kitchen

## 2018-11-03 NOTE — Addendum Note (Signed)
Addended by: Mertha Finders on: 11/03/2018 09:14 AM   Modules accepted: Orders

## 2018-11-05 ENCOUNTER — Telehealth: Payer: Self-pay

## 2018-11-05 NOTE — Telephone Encounter (Signed)
Pt called stating that he has been checking his bp as directed by Pcp. He wanted provider to be aware that his bp has been ranging 105/60's - 114/70's. No other inquiries during call.

## 2018-11-06 NOTE — Telephone Encounter (Signed)
Called patient and notified of MD instructions. KG LPN 

## 2018-11-06 NOTE — Telephone Encounter (Signed)
Those numbers are pretty good, perhaps a little on the low side but it's ok as long as he's NOT having issues with dizziness/weakness or feeling like he's going to pass out. If he is having nay of those symptoms let m eknow

## 2018-11-17 DIAGNOSIS — I2510 Atherosclerotic heart disease of native coronary artery without angina pectoris: Secondary | ICD-10-CM | POA: Diagnosis not present

## 2018-11-17 DIAGNOSIS — Z951 Presence of aortocoronary bypass graft: Secondary | ICD-10-CM | POA: Diagnosis not present

## 2018-11-17 DIAGNOSIS — I10 Essential (primary) hypertension: Secondary | ICD-10-CM | POA: Diagnosis not present

## 2018-11-17 DIAGNOSIS — I4819 Other persistent atrial fibrillation: Secondary | ICD-10-CM | POA: Diagnosis not present

## 2018-11-17 DIAGNOSIS — I429 Cardiomyopathy, unspecified: Secondary | ICD-10-CM | POA: Diagnosis not present

## 2019-01-13 NOTE — Progress Notes (Signed)
Subjective:   Ryan Bernard is a 85 y.o. male who presents for Medicare Annual/Subsequent preventive examination.  Review of Systems:  No ROS.  Medicare Wellness Virtual Visit.  Visual/audio telehealth visit, UTA vital signs.   See social history for additional risk factors.    Cardiac Risk Factors include: advanced age (>52men, >74 women);hypertension;male gender  Sleep patterns: Getting 9-10 hours of sleep a night. Wakes up 3 times a night to go to the bathroom. Wakes up and feels rested and ready for the day. Home Safety/Smoke Alarms: Feels safe in home. Smoke alarms in place.  Living environment; Lives with son in a 1 story home steps have hand rails on them.  Shower is a step over tub combo and grab bars are in place. Seat Belt Safety/Bike Helmet: Wears seat belt.   Male:   CCS- Aged out    PSA-  Aged out Lab Results  Component Value Date   PSA 0.64 06/11/2010   PSA 0.84 05/10/2009   PSA 0.76 05/06/2008        Objective:    Vitals: BP 130/78   Pulse 84   Temp (!) 97.5 F (36.4 C) (Oral)   Wt 145 lb (65.8 kg)   SpO2 96%   BMI 22.05 kg/m   Body mass index is 22.05 kg/m.  Advanced Directives 01/18/2019 01/13/2018 12/14/2013 09/13/2013  Does Patient Have a Medical Advance Directive? Yes Yes Yes Patient has advance directive, copy not in chart;Patient would not like information  Type of Advance Directive [place];Living will [place];Living will Living will Living will  Does patient want to make changes to medical advance directive? No - Patient declined No - Patient declined - -  Copy of [place] in Chart? No - copy requested No - copy requested - -    Tobacco Social History   Tobacco Use  Smoking Status Never Smoker  Smokeless Tobacco Never Used     Counseling given: No   Clinical Intake:  Pre-visit preparation completed: Yes  Pain : No/denies pain     Nutritional Risks: None Diabetes:  No  How often do you need to have someone help you when you read instructions, pamphlets, or other written materials from your doctor or pharmacy?: 1 - Never What is the last grade level you completed in school?: 12  Interpreter Needed?: No  Information entered by :: Orlie Dakin, LPN  Past Medical History:  Diagnosis Date  . Anemia   . BPH (benign prostatic hyperplasia)   . CAD (coronary artery disease) 1995   w/ angioplasty  . Diabetes mellitus 2004   type 2  . Heart disease    Mild valvular w/ pulm HTN  . Hyperlipidemia   . Hypertension   . SBE (subacute bacterial endocarditis) prophylaxis candidate    Past Surgical History:  Procedure Laterality Date  . [place] [facility]  . [place]  . CORONARY ARTERY BYPASS GRAFT  10/27/2009   5 vessel, Dr. Glenford Bayley  . HERNIA REPAIR     x 2    Family History  Problem Relation Age of Onset  . Heart disease Mother   . Hypertension Mother   . Alcohol abuse Father   . Depression Daughter   . Depression Son    Social History   Socioeconomic History  . Marital status: Widowed    Spouse name: Not on file  . Number of children: 2  .  Years of education: 7112  . Highest education level: 12th grade  Occupational History  . Occupation: retired    Comment: Insurance claims [employer]  Social Needs  . Financial resource strain: Not hard at all  . Food insecurity    Worry: Never true    Inability: Never true  . Transportation needs    Medical: No    Non-medical: No  Tobacco Use  . Smoking status: Never Smoker  . Smokeless tobacco: Never Used  Substance and Sexual Activity  . Alcohol use: No  . Drug use: No  . Sexual activity: Not Currently  Lifestyle  . Physical activity    Days per week: 7 days    Minutes per session: 60 min  . Stress: Not at all  Relationships  . Social Musicianconnections    Talks on phone: Twice a week    Gets together: Never    Attends religious service: More than 4 times per year    Active  member of club or organization: No    Attends meetings of clubs or organizations: Never    Relationship status: Widowed  Other Topics Concern  . Not on file  Social History Narrative   Sits around house and watches TV. Drinks 1 cup of coffee in the morning. Walks everyday for 1 hour    Outpatient Encounter Medications as of 01/18/2019  Medication Sig  . albuterol (PROVENTIL HFA;VENTOLIN HFA) 108 (90 Base) MCG/ACT inhaler Inhale 2 puffs into the lungs every 6 (six) hours as needed for wheezing.  . AMBULATORY NON FORMULARY MEDICATION Incentive spirometer, Disp #1, refill #1, Dx atelectasis  . apixaban (ELIQUIS) 5 MG TABS tablet Take 5 mg by mouth 2 (two) times daily.  . furosemide (LASIX) 40 MG tablet Take 1 tab PO daily prn for weight gain >=5 pounds  . losartan (COZAAR) 100 MG tablet Take 50 mg by mouth daily.  . metoprolol succinate (TOPROL-XL) 100 MG 24 hr tablet Take 100 mg by mouth daily. Take with or immediately following a meal.  . pravastatin (PRAVACHOL) 80 MG tablet Take 1 tablet (80 mg total) by mouth at bedtime.  Marland Kitchen. terazosin (HYTRIN) 10 MG capsule Take 10 mg by mouth daily.     No facility-administered encounter medications on file as of 01/18/2019.     Activities of Daily Living In your present state of health, do you have any difficulty performing the following activities: 01/18/2019  Hearing? N  Vision? N  Difficulty concentrating or making decisions? N  Walking or climbing stairs? N  Dressing or bathing? N  Doing errands, shopping? N  Preparing Food and eating ? N  Using the Toilet? N  In the past six months, have you accidently leaked urine? N  Do you have problems with loss of bowel control? N  Managing your Medications? N  Managing your Finances? N  Housekeeping or managing your Housekeeping? N  Some recent data might be hidden    Patient Care Team: Sunnie NielsenAlexander, [name], DO as PCP - General (Osteopathic Medicine) Hassan RowanWade, Eugene, MD (Inactive) (Family  Medicine) Hassan RowanWade, Eugene, MD (Inactive) (Family Medicine)   Assessment:   This is a routine wellness examination for Ryan Bernard.Physical assessment deferred to PCP.   Exercise Activities and Dietary recommendations Current Exercise Habits: Home exercise routine, Type of exercise: walking, Time (Minutes): 60, Frequency (Times/Week): 7, Weekly Exercise (Minutes/Week): 420, Intensity: Moderate, Exercise limited by: None identified Diet Eats a healthy diet of protein, vegetables and fruits. Breakfast: pancake, eggs and toast, biscuti and gravy, oatmeal Lunch:  skips Dinner:  Meat and vegetables     Goals    . Exercise 3x per week (30 min per time)     Increase exercise as tolerated       Fall Risk Fall Risk  01/18/2019 01/13/2018 12/01/2017 04/26/2016 06/10/2013  Falls in the past year? 0 0 - No No  Number falls in past yr: 0 - - - -  Injury with Fall? 0 - - - -  Risk for fall due to : Impaired balance/gait - Impaired balance/gait - -  Follow up Falls prevention discussed Falls prevention discussed - - -   Is the patient's home free of loose throw rugs in walkways, pet beds, electrical cords, etc?   yes      Grab bars in the bathroom? yes      Handrails on the stairs?   yes      Adequate lighting?   yes   Depression Screen PHQ 2/9 Scores 01/18/2019 01/13/2018 12/01/2017 07/21/2017  PHQ - 2 Score 0 0 0 0  PHQ- 9 Score - - - -    Cognitive Function     6CIT Screen 01/18/2019 01/13/2018  What Year? 0 points 0 points  What month? 0 points 0 points  What time? 0 points 0 points  Count back from 20 0 points 0 points  Months in reverse - 0 points  Repeat phrase 4 points 0 points  Total Score - 0    Immunization History  Administered Date(s) Administered  . Fluad Quad(high Dose 65+) 11/02/2018  . Influenza Split 12/24/2011  . Influenza Whole 01/09/2009, 12/20/2009, 12/28/2010  . Influenza, High Dose Seasonal PF 11/02/2015, 10/30/2016, 12/01/2017  . Influenza, Seasonal, Injecte, Preservative  Fre 11/06/2012, 12/14/2013, 01/10/2015  . Influenza,inj,Quad PF,6+ Mos 11/06/2012, 12/14/2013, 01/10/2015  . Influenza-Unspecified 11/02/2015, 10/30/2016, 12/01/2017  . Pneumococcal Conjugate-13 03/16/2014  . Pneumococcal Polysaccharide-23 08/20/2011  . Td 12/10/2002  . Tdap 10/10/2015    Screening Tests Health Maintenance  Topic Date Due  . FOOT EXAM  11/01/2016  . OPHTHALMOLOGY EXAM  04/25/2018  . HEMOGLOBIN A1C  05/05/2019  . TETANUS/TDAP  10/09/2025  . INFLUENZA VACCINE  Completed  . PNA vac Low Risk Adult  Completed        Plan:    Please schedule your next medicare wellness visit with me in 1 yr.  Ryan Bernard , Thank you for taking time to come for your Medicare Wellness Visit. I appreciate your ongoing commitment to your health goals. Please review the following plan we discussed and let me know if I can assist you in the future.   These are the goals we discussed: Goals    . Exercise 3x per week (30 min per time)     Increase exercise as tolerated       This is a list of the screening recommended for you and due dates:  Health Maintenance  Topic Date Due  . Complete foot exam   11/01/2016  . Eye exam for diabetics  04/25/2018  . Hemoglobin A1C  05/05/2019  . Tetanus Vaccine  10/09/2025  . Flu Shot  Completed  . Pneumonia vaccines  Completed     I have personally reviewed and noted the following in the patient's chart:   . Medical and social history . Use of alcohol, tobacco or illicit drugs  . Current medications and supplements . Functional ability and status . Nutritional status . Physical activity . Advanced directives . List of other physicians . Hospitalizations, surgeries, and  ER visits in previous 12 months . Vitals . Screenings to include cognitive, depression, and falls . Referrals and appointments  In addition, I have reviewed and discussed with patient certain preventive protocols, quality metrics, and best practice recommendations. A  written personalized care plan for preventive services as well as general preventive health recommendations were provided to patient.     Normand Sloop, LPN  [id number]/[id number]

## 2019-01-18 ENCOUNTER — Ambulatory Visit (INDEPENDENT_AMBULATORY_CARE_PROVIDER_SITE_OTHER): Payer: Medicare Other | Admitting: *Deleted

## 2019-01-18 ENCOUNTER — Other Ambulatory Visit: Payer: Self-pay

## 2019-01-18 VITALS — BP 130/78 | HR 84 | Temp 97.5°F | Wt 145.0 lb

## 2019-01-18 DIAGNOSIS — Z0000 Encounter for general adult medical examination without abnormal findings: Secondary | ICD-10-CM

## 2019-01-18 NOTE — Patient Instructions (Signed)
Please schedule your next medicare wellness visit with me in 1 yr.  Ryan Bernard , Thank you for taking time to come for your Medicare Wellness Visit. I appreciate your ongoing commitment to your health goals. Please review the following plan we discussed and let me know if I can assist you in the future.  These are the goals we discussed: Goals    . Exercise 3x per week (30 min per time)     Increase exercise as tolerated

## 2019-03-02 DIAGNOSIS — I255 Ischemic cardiomyopathy: Secondary | ICD-10-CM | POA: Diagnosis not present

## 2019-03-02 DIAGNOSIS — I10 Essential (primary) hypertension: Secondary | ICD-10-CM | POA: Diagnosis not present

## 2019-03-02 DIAGNOSIS — I482 Chronic atrial fibrillation, unspecified: Secondary | ICD-10-CM | POA: Diagnosis not present

## 2019-05-05 ENCOUNTER — Encounter: Payer: Self-pay | Admitting: Osteopathic Medicine

## 2019-05-05 ENCOUNTER — Other Ambulatory Visit: Payer: Self-pay

## 2019-05-05 ENCOUNTER — Ambulatory Visit: Payer: Medicare Other | Admitting: Osteopathic Medicine

## 2019-05-05 VITALS — BP 150/82 | HR 80 | Temp 97.5°F | Ht 68.0 in | Wt 154.1 lb

## 2019-05-05 DIAGNOSIS — R7303 Prediabetes: Secondary | ICD-10-CM | POA: Diagnosis not present

## 2019-05-05 DIAGNOSIS — N183 Chronic kidney disease, stage 3 unspecified: Secondary | ICD-10-CM

## 2019-05-05 DIAGNOSIS — E039 Hypothyroidism, unspecified: Secondary | ICD-10-CM

## 2019-05-05 DIAGNOSIS — I10 Essential (primary) hypertension: Secondary | ICD-10-CM | POA: Diagnosis not present

## 2019-05-05 DIAGNOSIS — E038 Other specified hypothyroidism: Secondary | ICD-10-CM

## 2019-05-05 NOTE — Progress Notes (Signed)
Ryan Bernard is a 86 y.o. male who presents to  [facility]  today, 05/05/19, seeking care for the following:  The primary encounter diagnosis was Essential hypertension, benign. Diagnoses of Stage 3 chronic kidney disease, unspecified whether stage 3a or 3b CKD, Subclinical hypothyroidism, and Prediabetes were also pertinent to this visit.    ASSESSMENT & PLAN with other pertinent history/findings:  1. Essential hypertension, benign Stable, above goal now, pt reports low BP at home on occasion associated w/ lightheadedness, he's advised let me know if systolic BP <100 and/or if he's lightheaded or feels about to pass out.   2. Stage 3 chronic kidney disease, unspecified whether stage 3a or 3b CKD Will get labs Last Cr 1.26  3. Subclinical hypothyroidism Last TSH <10 and T4 normal, will get labs   4. Prediabetes Needs A1C       Orders Placed This Encounter  Procedures  . CBC  . COMPLETE METABOLIC PANEL WITH GFR  . TSH  . T4, free  . Hemoglobin A1c    No orders of the defined types were placed in this encounter.   There are no Patient Instructions on file for this visit.    Follow-up instructions: Return in about 6 months (around 11/02/2019) for routine check-up, labs .                       BP (!) 150/82   Pulse 80   Temp (!) 97.5 F (36.4 C)   Ht 5\' 8"  (1.727 m)   Wt 154 lb 1.6 oz (69.9 kg)   SpO2 92%   BMI 23.43 kg/m   Current Meds  Medication Sig  . apixaban (ELIQUIS) 5 MG TABS tablet Take 5 mg by mouth 2 (two) times daily.  . furosemide (LASIX) 40 MG tablet Take 1 tab PO daily prn for weight gain >=5 pounds  . losartan (COZAAR) 100 MG tablet Take 50 mg by mouth daily.  . metoprolol succinate (TOPROL-XL) 100 MG 24 hr tablet Take 100 mg by mouth daily. Take with or immediately following a meal.  . pravastatin (PRAVACHOL) 80 MG tablet Take 1 tablet (80 mg total) by mouth at  bedtime.  terazosin (HYTRIN) 10 MG capsule Take 10 mg by mouth daily.      No results found for this or any previous visit (from the past 72 hour(s)).  No results found.  Depression screen [facility] 2/9 01/18/2019 01/13/2018 12/01/2017  Decreased Interest 0 0 0  Down, Depressed, Hopeless 0 0 0  PHQ - 2 Score 0 0 0  Altered sleeping - - -  Tired, decreased energy - - -  Change in appetite - - -  Feeling bad or failure about yourself  - - -  Trouble concentrating - - -  Moving slowly or fidgety/restless - - -  Suicidal thoughts - - -  PHQ-9 Score - - -    No flowsheet data found.    All questions at time of visit were answered - patient instructed to contact office with any additional concerns or updates.  ER/RTC precautions were reviewed with the patient.  Please note: voice recognition software was used to produce this document, and typos may escape review. Please contact Dr. 12/03/2017 for any needed clarifications.

## 2019-05-06 DIAGNOSIS — I10 Essential (primary) hypertension: Secondary | ICD-10-CM | POA: Diagnosis not present

## 2019-05-06 DIAGNOSIS — R9430 Abnormal result of cardiovascular function study, unspecified: Secondary | ICD-10-CM | POA: Diagnosis not present

## 2019-05-06 DIAGNOSIS — I255 Ischemic cardiomyopathy: Secondary | ICD-10-CM | POA: Diagnosis not present

## 2019-05-06 DIAGNOSIS — I482 Chronic atrial fibrillation, unspecified: Secondary | ICD-10-CM | POA: Diagnosis not present

## 2019-05-07 DIAGNOSIS — H524 Presbyopia: Secondary | ICD-10-CM | POA: Diagnosis not present

## 2019-05-28 DIAGNOSIS — I082 Rheumatic disorders of both aortic and tricuspid valves: Secondary | ICD-10-CM | POA: Diagnosis not present

## 2019-05-28 DIAGNOSIS — I5189 Other ill-defined heart diseases: Secondary | ICD-10-CM | POA: Diagnosis not present

## 2019-05-28 DIAGNOSIS — I517 Cardiomegaly: Secondary | ICD-10-CM | POA: Diagnosis not present

## 2019-06-17 DIAGNOSIS — I482 Chronic atrial fibrillation, unspecified: Secondary | ICD-10-CM | POA: Diagnosis not present

## 2019-06-17 DIAGNOSIS — I255 Ischemic cardiomyopathy: Secondary | ICD-10-CM | POA: Diagnosis not present

## 2019-06-17 DIAGNOSIS — I10 Essential (primary) hypertension: Secondary | ICD-10-CM | POA: Diagnosis not present

## 2019-06-17 DIAGNOSIS — R9430 Abnormal result of cardiovascular function study, unspecified: Secondary | ICD-10-CM | POA: Diagnosis not present

## 2019-07-02 DIAGNOSIS — I255 Ischemic cardiomyopathy: Secondary | ICD-10-CM | POA: Diagnosis not present

## 2019-07-02 DIAGNOSIS — R9430 Abnormal result of cardiovascular function study, unspecified: Secondary | ICD-10-CM | POA: Diagnosis not present

## 2019-07-02 DIAGNOSIS — Z79899 Other long term (current) drug therapy: Secondary | ICD-10-CM | POA: Diagnosis not present

## 2019-07-29 DIAGNOSIS — I2510 Atherosclerotic heart disease of native coronary artery without angina pectoris: Secondary | ICD-10-CM | POA: Diagnosis not present

## 2019-07-29 DIAGNOSIS — I4819 Other persistent atrial fibrillation: Secondary | ICD-10-CM | POA: Diagnosis not present

## 2019-07-29 DIAGNOSIS — I255 Ischemic cardiomyopathy: Secondary | ICD-10-CM | POA: Diagnosis not present

## 2019-07-29 DIAGNOSIS — I10 Essential (primary) hypertension: Secondary | ICD-10-CM | POA: Diagnosis not present

## 2019-08-25 ENCOUNTER — Ambulatory Visit: Payer: Medicare Other | Admitting: Osteopathic Medicine

## 2019-08-25 ENCOUNTER — Encounter: Payer: Self-pay | Admitting: Osteopathic Medicine

## 2019-08-25 ENCOUNTER — Other Ambulatory Visit: Payer: Self-pay

## 2019-08-25 ENCOUNTER — Telehealth: Payer: Self-pay

## 2019-08-25 VITALS — BP 137/80 | HR 89 | Temp 97.0°F | Wt 152.0 lb

## 2019-08-25 DIAGNOSIS — I952 Hypotension due to drugs: Secondary | ICD-10-CM

## 2019-08-25 DIAGNOSIS — Z951 Presence of aortocoronary bypass graft: Secondary | ICD-10-CM

## 2019-08-25 DIAGNOSIS — R7303 Prediabetes: Secondary | ICD-10-CM | POA: Diagnosis not present

## 2019-08-25 DIAGNOSIS — E038 Other specified hypothyroidism: Secondary | ICD-10-CM

## 2019-08-25 DIAGNOSIS — N183 Chronic kidney disease, stage 3 unspecified: Secondary | ICD-10-CM

## 2019-08-25 DIAGNOSIS — I5020 Unspecified systolic (congestive) heart failure: Secondary | ICD-10-CM

## 2019-08-25 DIAGNOSIS — E039 Hypothyroidism, unspecified: Secondary | ICD-10-CM

## 2019-08-25 DIAGNOSIS — I10 Essential (primary) hypertension: Secondary | ICD-10-CM | POA: Diagnosis not present

## 2019-08-25 DIAGNOSIS — I4891 Unspecified atrial fibrillation: Secondary | ICD-10-CM | POA: Diagnosis not present

## 2019-08-25 MED ORDER — FUROSEMIDE 20 MG PO TABS: 20.0000 mg | ORAL_TABLET | Freq: Every day | ORAL | Status: DC

## 2019-08-25 NOTE — Telephone Encounter (Signed)
Pt's daughter called after pt's appt today stating that they realized after they got home that the pt is taking an additional medication that they forgot to inform the physician about.  Ms. Ryan Bernard states that the pt is also taking spironolactone 25mg  that was prescribed by cardiology.  , CMA

## 2019-08-25 NOTE — Progress Notes (Signed)
Ryan Bernard is a 87 y.o. male who presents to  [place] at [facility]  today, 08/25/19, seeking care for the following: . Hypotension:  o Hx CAD/CABG, Afib, CHF EF 35-40.  o Cardiology visit notes reviewed, labs ordered by cardiology 07/29/19 BMP stable renal function Cr 1.38, NT-proBNP elevated at 6873 up from previous measurements 07/02/19. o Low BP at home measuring 80s/40s on home monitor, which was verified by home health according to patietn's daughter. Low BP associated w/ dizziness and weakness.   o Has stopped Losartan x10 days now (cardiology had him at 25 mg daily), still on metoprolol 100 mg daily, Lasix 20 mg daily (family reports this ws reduced from 40 mg daily but notes state there was no change to the 40 mg dose, daughter states home health in cooperation w/ cardiology reduced Lasix from 40 to 20 mg daily and advised stop Losartan. No record of this in Epic that I can see) o Since off Losartan, pt notes improvement in energy levels, no more dizzy spells, no drops in BP.      ASSESSMENT & PLAN with other pertinent history/findings:  The primary encounter diagnosis was Essential hypertension, benign. Diagnoses of Stage 3 chronic kidney disease, unspecified whether stage 3a or 3b CKD, Subclinical hypothyroidism, Prediabetes, S/P CABG x 5, Hypotension due to drugs, Atrial fibrillation, unspecified type ([place]), Systolic congestive heart failure with reduced left ventricular function, NYHA class 1 ([place]), and ACC/AHA stage C heart failure with reduced ejection fraction [facility]) were also pertinent to this visit.  BP Readings from Last 3 Encounters:  08/25/19 137/80  05/05/19 (!) 150/82  01/18/19 130/78    I thin ok to stay on current Rx Labs as below  Patient Instructions  Labs today Can forward results to cardiology  Keep medications same for now     Orders Placed This Encounter  Procedures  . NT-proBNP  . BASIC METABOLIC  PANEL WITH GFR  . Hemoglobin A1c  . TSH  . T4, free    Meds ordered this encounter  Medications  . furosemide (LASIX) 20 MG tablet    Sig: Take 1 tablet (20 mg total) by mouth daily.       Follow-up instructions: Return for recheck as directed by cardiology and as-needed w/ me. .                                         BP 137/80 (BP Location: Left Arm, Patient Position: Sitting)   Pulse 89   Temp (!) 97 F (36.1 C)   Wt 152 lb (68.9 kg)   SpO2 97%   BMI 23.11 kg/m   Current Meds  Medication Sig  . apixaban (ELIQUIS) 5 MG TABS tablet Take 5 mg by mouth 2 (two) times daily.  . metoprolol succinate (TOPROL-XL) 100 MG 24 hr tablet Take 100 mg by mouth daily. Take with or immediately following a meal.  . pravastatin (PRAVACHOL) 80 MG tablet Take 1 tablet (80 mg total) by mouth at bedtime.  Marland Kitchen terazosin (HYTRIN) 10 MG capsule Take 10 mg by mouth daily.    . [DISCONTINUED] spironolactone (ALDACTONE) 25 MG tablet Take by mouth.    No results found for this or any previous visit (from the past 72 hour(s)).  No results found.  Depression screen [facility] 2/9 08/25/2019 08/25/2019 01/18/2019  Decreased Interest 0 0 0  Down, Depressed, Hopeless 0 0 0  PHQ - 2 Score 0 0 0  Altered sleeping 0 0 -  Tired, decreased energy 3 3 -  Change in appetite 0 0 -  Feeling bad or failure about yourself  0 0 -  Trouble concentrating 0 0 -  Moving slowly or fidgety/restless 3 0 -  Suicidal thoughts 0 0 -  PHQ-9 Score 6 3 -  Difficult doing work/chores Somewhat difficult Somewhat difficult -    GAD 7 : Generalized Anxiety Score 08/25/2019  Nervous, Anxious, on Edge 0  Control/stop worrying 0  Worry too much - different things 0  Trouble relaxing 0  Restless 0  Easily annoyed or irritable 0  Afraid - awful might happen 0  Total GAD 7 Score 0  Anxiety Difficulty Not difficult at all      All questions at time of visit were answered - patient instructed  to contact office with any additional concerns or updates.  ER/RTC precautions were reviewed with the patient.  Please note: voice recognition software was used to produce this document, and typos may escape review. Please contact Dr. Lyn Hollingshead for any needed clarifications.

## 2019-08-25 NOTE — Patient Instructions (Signed)
Labs today Can forward results to cardiology  Keep medications same for now

## 2019-08-26 LAB — TSH: TSH: 10.66 mIU/L — ABNORMAL HIGH (ref 0.40–4.50)

## 2019-08-26 LAB — BASIC METABOLIC PANEL WITH GFR
BUN/Creatinine Ratio: 25 (calc) — ABNORMAL HIGH (ref 6–22)
BUN: 42 mg/dL — ABNORMAL HIGH (ref 7–25)
CO2: 32 mmol/L (ref 20–32)
Calcium: 9.7 mg/dL (ref 8.6–10.3)
Chloride: 100 mmol/L (ref 98–110)
Creat: 1.67 mg/dL — ABNORMAL HIGH (ref 0.70–1.11)
GFR, Est African American: 42 mL/min/{1.73_m2} — ABNORMAL LOW (ref >=60)
GFR, Est Non African American: 36 mL/min/{1.73_m2} — ABNORMAL LOW (ref >=60)
Glucose, Bld: 139 mg/dL — ABNORMAL HIGH (ref 65–99)
Potassium: 4.2 mmol/L (ref 3.5–5.3)
Sodium: 143 mmol/L (ref 135–146)

## 2019-08-26 LAB — NT-PROBNP: Pro B Natriuretic peptide (BNP): 3877 pg/mL — ABNORMAL HIGH

## 2019-08-26 LAB — HEMOGLOBIN A1C
Hgb A1c MFr Bld: 6.7 % of total Hgb — ABNORMAL HIGH (ref <5.7)
Mean Plasma Glucose: 146 (calc)
eAG (mmol/L): 8.1 (calc)

## 2019-08-26 LAB — T4, FREE: Free T4: 1.1 ng/dL (ref 0.8–1.8)

## 2019-08-26 NOTE — Telephone Encounter (Signed)
Ok to conintue that Rx if he's already on it  Please make sure it's on his med list Thanks!

## 2019-08-27 ENCOUNTER — Other Ambulatory Visit: Payer: Self-pay

## 2019-08-27 NOTE — Progress Notes (Signed)
Opened in error

## 2019-08-27 NOTE — Telephone Encounter (Signed)
Aldactone rx updated in pt's active med list. Per provider's request, copy of most recent thyroid results faxed to Cardiologist office. Confirmation rec'd.

## 2019-09-24 DIAGNOSIS — I255 Ischemic cardiomyopathy: Secondary | ICD-10-CM | POA: Diagnosis not present

## 2019-09-24 DIAGNOSIS — I482 Chronic atrial fibrillation, unspecified: Secondary | ICD-10-CM | POA: Diagnosis not present

## 2019-11-02 ENCOUNTER — Ambulatory Visit: Payer: Medicare Other | Admitting: Osteopathic Medicine

## 2019-11-29 ENCOUNTER — Encounter: Payer: Self-pay | Admitting: Family Medicine

## 2019-11-29 ENCOUNTER — Other Ambulatory Visit: Payer: Self-pay | Admitting: Nurse Practitioner

## 2019-11-29 ENCOUNTER — Telehealth (INDEPENDENT_AMBULATORY_CARE_PROVIDER_SITE_OTHER): Payer: Medicare Other | Admitting: Family Medicine

## 2019-11-29 DIAGNOSIS — Z20822 Contact with and (suspected) exposure to covid-19: Secondary | ICD-10-CM

## 2019-11-29 NOTE — Progress Notes (Signed)
Ryan Bernard is a 86 y.o. male that meets the FDA criteria for Emergency Use Authorization of casirivimab/imdevimab (REGEN-COV) for post-exposure prophylaxis of COVID-19 in individuals who are at high risk for progression to severe COVID-19, including hospitalization or death, and are: . Not fully vaccinated or who are not expected to mount an adequate immune response to complete SARS-CoV-2 vaccination (for example, individuals with immunocompromising conditions including those taking immunosuppressive medications) and - Have been exposed to an individual infected with SARS-CoV-2 consistent with close contact criteria per CDC or - Who are at high risk of exposure to an individual infected with SARS-CoV-2 because of occurrence of COVID-19 infection in other individuals in the same institutional setting (for example, nursing homes or prisons) . For individuals in whom repeat dosing is determined to be appropriate for ongoing exposure to SARS-CoV-2 for longer than 4 weeks and who are not expected to mount an adequate immune response to complete SARS-CoV-2 vaccination, the initial dose is 600 mg of casirivimab and 600 mg of imdevimab (1217m total) by subcutaneous injection or intravenous infusion followed by subsequent repeat dosing of 300 mg of casirivimab and 300 mg of imdevimab (6049mtotal) by subcutaneous injection or intravenous infusion once every 4 weeks for the duration of ongoing exposure.   Patient has met the above post-exposure definition and has the following high risk criteria: Older age (>/= 653o)  I have spoken and communicated the following to the patient or parent/caregiver regarding COVID monoclonal antibody treatment:   FDA has authorized REGEN-COV for emergency use of post-exposure prophylaxis of COVID-19   The significant known and potential risks and benefits of COVID monoclonal antibody, and the extent to which such potential risks and benefits are unknown.   Information on  available alternative treatments and the risks and benefits of those alternatives, including clinical trials.   Patients treated with COVID monoclonal antibody should continue to self-isolate and use infection control measures (e.g., wear mask, isolate, social distance, avoid sharing personal items, clean and disinfect "high touch" surfaces, and frequent handwashing) according to CDC guidelines.    The patient or parent/caregiver has the option to accept or refuse COVID monoclonal antibody treatment.  After reviewing this information with the patient, The patient has agreed to proceed with receiving casirivimab\imdevimab 120015mnjection and will be provided a copy of the patient fact sheet prior to receiving the injection.  TonFenton FoyP 11/29/2019 4:11 PM

## 2019-11-29 NOTE — Progress Notes (Signed)
Pt reports that he was exposed on Saturday. His son was tested yesterday and his test was positive. He reports that his son lives with him.   Pt said that he hasn't had a temp and doesn't feel bad at all.

## 2019-11-29 NOTE — Progress Notes (Signed)
Virtual Visit via Telephone Note  I connected with Ryan Bernard on 11/29/19 at  2:40 PM EDT by telephone and verified that I am speaking with the correct person using two identifiers.   I discussed the limitations, risks, security and privacy concerns of performing an evaluation and management service by telephone and the availability of in person appointments. I also discussed with the patient that there may be a patient responsible charge related to this service. The patient expressed understanding and agreed to proceed.  Patient location:  At home  Provider loccation: In office   Subjective:    CC: covid exposure  HPI: 86 year old male comes in today concerned about potential exposure to Covid from his son with whom he lives.  Son started feeling ill on Saturday and then went to get tested and came back positive.  Patient himself has not been symptomatic at all he says he feels well he did get his Covid vaccine at the [place] in February and March.  He just wants to know what he should do with anything to prevent from getting Covid.  He has been trying to wear his mask in the house.  They do have to share a bathroom.   Past medical history, Surgical history, Family history not pertinant except as noted below, Social history, Allergies, and medications have been entered into the medical record, reviewed, and corrections made.   Review of Systems: No fevers, chills, night sweats, weight loss, chest pain, or shortness of breath.   Objective:    General: Speaking clearly in complete sentences without any shortness of breath.  Alert and oriented x3.  Normal judgment. No apparent acute distress.    Impression and Recommendations:    Covid exposure-he is within the 4-day window to consider possible generalized injection.  We will see if we might able to get him transportation as he no longer drives that far and his son is quarantined at home.  He says he does not really have anybody else who  could take him.  He has had his vaccine which is great    I discussed the assessment and treatment plan with the patient. The patient was provided an opportunity to ask questions and all were answered. The patient agreed with the plan and demonstrated an understanding of the instructions.   The patient was advised to call back or seek an in-person evaluation if the symptoms worsen or if the condition fails to improve as anticipated.  I provided 18 minutes of non-face-to-face time during this encounter.   Nani Gasser, MD

## 2019-12-01 ENCOUNTER — Ambulatory Visit (INDEPENDENT_AMBULATORY_CARE_PROVIDER_SITE_OTHER): Payer: Medicare Other | Admitting: *Deleted

## 2019-12-01 ENCOUNTER — Other Ambulatory Visit: Payer: Self-pay | Admitting: Nurse Practitioner

## 2019-12-01 ENCOUNTER — Other Ambulatory Visit: Payer: Self-pay

## 2019-12-01 ENCOUNTER — Encounter: Payer: Self-pay | Admitting: *Deleted

## 2019-12-01 DIAGNOSIS — Z20822 Contact with and (suspected) exposure to covid-19: Secondary | ICD-10-CM

## 2019-12-01 MED ORDER — CASIRIVIMAB-IMDEVIMAB 600-600 MG/10ML IJ SOLN
300.0000 mg | INTRAMUSCULAR | Status: AC
  Administered 2019-12-01 (×4): 300 mg via SUBCUTANEOUS

## 2019-12-01 NOTE — Patient Instructions (Signed)

## 2019-12-01 NOTE — Progress Notes (Signed)
  Diagnosis: COVID-19 post-exposure prophylaxis  Provider: Angus Seller, NP  Procedure: casirivimab\imdevimab subcutaneous injection - Provided patient with casirivimab\imdevimab fact sheet for patients, parents and caregivers prior to injection.  Complications: No immediate complications noted.  Discharge: Discharged home   Lorel Monaco, [place] 12/01/2019

## 2019-12-08 ENCOUNTER — Telehealth: Payer: Self-pay

## 2019-12-08 NOTE — Telephone Encounter (Signed)
Pt LVM requesting a call back about upcoming appointment info and wanting to know if he should come in and get the flu shot before then or wait. Called pt and told him that he is currently scheduled for his MAWV on 01/19/2020 at 9:00 AM. He asked if he needed to wait to get his flu shot or if he needed to go ahead and get it done now. I told him that he could schedule an appt as a nurse visit and get the flu shot or he could wait until his appt, that the decision was his to make. He also wanted to know if he was due for his pneumonia shot. I looked at his chart and health maintenance and told him that it looked like he was up to date for his pneumonia shot. He said that he would just wait until his appt on 01/19/2020 to get his flu shot since it would be closer to the time that flu actually starts. No further questions or concerns at this time.   As a side note, when I looked up his appt, he was actually scheduled for his MAWV with the Health Navigation Nurse. The appt was scheduled last November. When I saw this I told Harrison Mons about it and asked that he change the appt and put him on Joy or Molly Maduro schedule for the same day and time so that we did not have to scramble to get him on the schedule. The appt was changed to Joy's schedule on 01/19/2020 at 9:10. Pt has been instructed to arrive at 9:00 for his appt.

## 2019-12-21 ENCOUNTER — Ambulatory Visit (INDEPENDENT_AMBULATORY_CARE_PROVIDER_SITE_OTHER): Payer: Medicare Other | Admitting: Osteopathic Medicine

## 2019-12-21 ENCOUNTER — Other Ambulatory Visit: Payer: Self-pay

## 2019-12-21 DIAGNOSIS — Z23 Encounter for immunization: Secondary | ICD-10-CM

## 2019-12-24 DIAGNOSIS — I25810 Atherosclerosis of coronary artery bypass graft(s) without angina pectoris: Secondary | ICD-10-CM | POA: Diagnosis not present

## 2019-12-24 DIAGNOSIS — I10 Essential (primary) hypertension: Secondary | ICD-10-CM | POA: Diagnosis not present

## 2019-12-24 DIAGNOSIS — I482 Chronic atrial fibrillation, unspecified: Secondary | ICD-10-CM | POA: Diagnosis not present

## 2019-12-24 DIAGNOSIS — I255 Ischemic cardiomyopathy: Secondary | ICD-10-CM | POA: Diagnosis not present

## 2020-01-19 ENCOUNTER — Ambulatory Visit (INDEPENDENT_AMBULATORY_CARE_PROVIDER_SITE_OTHER): Payer: Medicare Other | Admitting: Medical-Surgical

## 2020-01-19 ENCOUNTER — Ambulatory Visit: Payer: Medicare Other

## 2020-01-19 ENCOUNTER — Encounter: Payer: Self-pay | Admitting: Medical-Surgical

## 2020-01-19 VITALS — BP 172/100 | HR 130 | Temp 97.7°F | Ht 67.0 in | Wt 148.8 lb

## 2020-01-19 DIAGNOSIS — Z0000 Encounter for general adult medical examination without abnormal findings: Secondary | ICD-10-CM | POA: Diagnosis not present

## 2020-01-19 NOTE — Patient Instructions (Addendum)
BP elevated today and heart rate in the 130s, irregular. Asymptomatic at this time. Recommend calling his cardiology office with these numbers to see if they want to make any changes or suggest any treatments.    Ryan Bernard , Thank you for taking time to come for your Medicare Wellness Visit. I appreciate your ongoing commitment to your health goals. Please review the following plan we discussed and let me know if I can assist you in the future.   These are the goals we discussed: Goals    . Exercise 3x per week (30 min per time)     Increase exercise as tolerated. Using a pedal bike for exercise.       This is a list of the screening recommended for you and due dates:  Health Maintenance  Topic Date Due  . Eye exam for diabetics  01/19/2020*  . Complete foot exam   02/18/2020*  . Hemoglobin A1C  02/24/2020  . Tetanus Vaccine  10/09/2025  . Flu Shot  Completed  . COVID-19 Vaccine  Completed  . Pneumonia vaccines  Completed  *Topic was postponed. The date shown is not the original due date.

## 2020-01-19 NOTE — Progress Notes (Signed)
Subjective:   Ryan Bernard is a 86 y.o. male who presents for Medicare Annual/Subsequent preventive examination.  Review of Systems    Denies fever, chills, shortness of breath, chest pain, abdominal pain, dizziness, headaches, and sleep disturbance. Cardiac Risk Factors include: advanced age (>26men, >67 women);hypertension;male gender;sedentary lifestyle  Positive for fatigue, palpitations, weight changes/leg swelling (when he doesn't take his fluid pill), and nocturia.  Has a cardiac stress test scheduled next week with Cardiology.     Objective:    Today's Vitals   01/19/20 0922  BP: (!) 169/98  Pulse: (!) 138  Temp: 97.7 F (36.5 C)  TempSrc: Oral  SpO2: 95%  Weight: 148 lb 12.8 oz (67.5 kg)  Height: 5\' 7"  (1.702 m)   Body mass index is 23.31 kg/m.  Advanced Directives 01/19/2020 01/18/2019 01/13/2018 12/14/2013 09/13/2013  Does Patient Have a Medical Advance Directive? Yes Yes Yes Yes Patient has advance directive, copy not in chart;Patient would not like information  Type of Advance Directive Healthcare Power of [place];Living will Healthcare Power of [place];Living will Healthcare Power of [place];Living will Living will Living will  Does patient want to make changes to medical advance directive? No - Patient declined No - Patient declined No - Patient declined - -  Copy of Healthcare Power of Attorney in Chart? No - copy requested No - copy requested No - copy requested - -    Current Medications (verified) Outpatient Encounter Medications as of 01/19/2020  Medication Sig  . albuterol (PROVENTIL HFA;VENTOLIN HFA) 108 (90 Base) MCG/ACT inhaler Inhale 2 puffs into the lungs every 6 (six) hours as needed for wheezing.  13/12/2019 apixaban (ELIQUIS) 5 MG TABS tablet Take 5 mg by mouth 2 (two) times daily.  . furosemide (LASIX) 20 MG tablet Take 1 tablet (20 mg total) by mouth daily.  . metoprolol succinate (TOPROL-XL) 100 MG 24 hr tablet Take 100 mg by mouth daily. Take with or  immediately following a meal.  . pravastatin (PRAVACHOL) 80 MG tablet Take 1 tablet (80 mg total) by mouth at bedtime.  Marland Kitchen spironolactone (ALDACTONE) 25 MG tablet Take 25 mg by mouth daily.  Marland Kitchen terazosin (HYTRIN) 10 MG capsule Take 10 mg by mouth daily.     No facility-administered encounter medications on file as of 01/19/2020.    Allergies (verified) Patient has no known allergies.   History: Past Medical History:  Diagnosis Date  . Anemia   . BPH (benign prostatic hyperplasia)   . CAD (coronary artery disease) 1995   w/ angioplasty  . Diabetes mellitus 2004   type 2  . Heart disease    Mild valvular w/ pulm HTN  . Hyperlipidemia   . Hypertension   . SBE (subacute bacterial endocarditis) prophylaxis candidate    Past Surgical History:  Procedure Laterality Date  . ANGIOPLASTY  1995   [facility]  . BACK SURGERY  1964  . CORONARY ARTERY BYPASS GRAFT  10/27/2009   5 vessel, Dr. 10/29/2009  . HERNIA REPAIR     x 2    Family History  Problem Relation Age of Onset  . Heart disease Mother   . Hypertension Mother   . Alcohol abuse Father   . Depression Daughter   . Depression Son    Social History   Socioeconomic History  . Marital status: Widowed    Spouse name: Not on file  . Number of children: 2  . Years of education: 12  . Highest education level: 12th grade  Occupational  History  . 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 Smoking status: Never Smoker  . Smokeless tobacco: Never Used  Vaping Use  . Vaping Use: Never used  Substance and Sexual Activity  . Alcohol use: No  . Drug use: No  . Sexual activity: Not Currently  Other Topics Concern  . Not on file  Social History Narrative   Sits around house and watches TV. Drinks 1 cup of coffee in the morning. Walks everyday for 1 hour   Social Determinants of Health   Financial Resource Strain:   . Difficulty of Paying Living Expenses: Not on file  Food Insecurity:   . Worried  About Programme researcher, broadcasting/film/videounning Out of Food in the Last Year: Not on file  . Ran Out of Food in the Last Year: Not on file  Transportation Needs:   . Lack of Transportation (Medical): Not on file  . Lack of Transportation (Non-Medical): Not on file  Physical Activity:   . Days of Exercise per Week: Not on file  . Minutes of Exercise per Session: Not on file  Stress:   . Feeling of Stress : Not on file  Social Connections:   . Frequency of Communication with Friends and Family: Not on file  . Frequency of Social Gatherings with Friends and Family: Not on file  . Attends Religious Services: Not on file  . Active Member of Clubs or Organizations: Not on file  . Attends BankerClub or Organization Meetings: Not on file  . Marital Status: Not on file    Tobacco Counseling Counseling given: Not Answered   Clinical Intake:  Pre-visit preparation completed: Yes  Pain : No/denies pain     BMI - recorded: 23.31 Nutritional Status: BMI of 19-24  Normal Diabetes: No     Diabetic?  Yes.followup  Interpreter Needed?: No  Information entered by :: [place],CMA   Activities of Daily Living In your present state of health, do you have any difficulty performing the following activities: 01/19/2020  Hearing? Y  Vision? N  Difficulty concentrating or making decisions? Y  Walking or climbing stairs? N  Dressing or bathing? N  Doing errands, shopping? N  Preparing Food and eating ? N  Using the Toilet? N  In the past six months, have you accidently leaked urine? N  Do you have problems with loss of bowel control? N  Managing your Medications? N  Managing your Finances? N  Housekeeping or managing your Housekeeping? N  Some recent data might be hidden    Patient Care Team: Sunnie NielsenAlexander, [name], DO as PCP - General (Osteopathic Medicine) Hassan RowanWade, Eugene, MD (Inactive) (Family Medicine) Hassan RowanWade, Eugene, MD (Inactive) (Family Medicine)  Indicate any recent Medical Services you may have received from other than  [facility] providers in the past year (date may be approximate).     Assessment:   This is a routine wellness examination for Ryan Bernard.  Hearing/Vision screen No exam data present  Dietary issues and exercise activities discussed: Current Exercise Habits: The patient does not participate in regular exercise at present, Exercise limited by: cardiac condition(s)  Goals    . Exercise 3x per week (30 min per time)     Increase exercise as tolerated. Using a pedal bike for exercise.      Depression Screen PHQ 2/9 Scores 01/19/2020 08/25/2019 08/25/2019 01/18/2019 01/13/2018 12/01/2017 07/21/2017  PHQ - 2 Score 0 0 0 0 0 0 0  PHQ- 9 Score - 6 3 - - - -  Fall Risk Fall Risk  01/19/2020 01/18/2019 01/13/2018 12/01/2017 04/26/2016  Falls in the past year? 1 0 0 - No  Number falls in past yr: 0 0 - - -  Injury with Fall? 1 0 - - -  Risk for fall due to : - Impaired balance/gait - Impaired balance/gait -  Follow up Falls evaluation completed Falls prevention discussed Falls prevention discussed - -   Any stairs in or around the home? Yes  If so, are there any without handrails? Yes  Home free of loose throw rugs in walkways, pet beds, electrical cords, etc? No  Adequate lighting in your home to reduce risk of falls? Yes   ASSISTIVE DEVICES UTILIZED TO PREVENT FALLS:  Life alert? No  Use of a cane, walker or w/c? yes, cane used for long walks Grab bars in the bathroom? Yes  Shower chair or bench in shower? No  Elevated toilet seat or a handicapped toilet? Yes   Gait steady and fast without use of assistive device  Cognitive Function:     6CIT Screen 01/19/2020 01/18/2019 01/13/2018  What Year? 0 points 0 points 0 points  What month? 0 points 0 points 0 points  What time? 0 points 0 points 0 points  Count back from 20 0 points 0 points 0 points  Months in reverse 0 points - 0 points  Repeat phrase 10 points 4 points 0 points  Total Score 10 - 0   Immunizations Immunization History   Administered Date(s) Administered  . Fluad Quad(high Dose 65+) 11/02/2018, 12/21/2019  . Influenza Split 12/24/2011  . Influenza Whole 01/09/2009, 12/20/2009, 12/28/2010  . Influenza, High Dose Seasonal PF 11/02/2015, 10/30/2016, 12/01/2017  . Influenza, Seasonal, Injecte, Preservative Fre 11/06/2012, 12/14/2013, 01/10/2015  . Influenza,inj,Quad PF,6+ Mos 11/06/2012, 12/14/2013, 01/10/2015  . Influenza-Unspecified 11/02/2015, 10/30/2016, 12/01/2017  . PFIZER SARS-COV-2 Vaccination 05/04/2019, 05/25/2019  . Pneumococcal Conjugate-13 03/16/2014  . Pneumococcal Polysaccharide-23 08/20/2011  . Td 12/10/2002  . Tdap 10/10/2015     TDAP status: Up to date Flu Vaccine status: Up to date Pneumococcal vaccine status: Up to date Covid-19 vaccine status: Completed vaccines  Qualifies for Shingles Vaccine? Yes   Zostavax completed No   Shingrix Completed?: No.    Education has been provided regarding the importance of this vaccine. Patient has been advised to call insurance company to determine out of pocket expense if they have not yet received this vaccine. Advised may also receive vaccine at local pharmacy or Health Dept. Verbalized acceptance and understanding.  Screening Tests Health Maintenance  Topic Date Due  . OPHTHALMOLOGY EXAM  01/19/2020 (Originally 04/25/2018)  . FOOT EXAM  02/18/2020 (Originally 11/01/2016)  . HEMOGLOBIN A1C  02/24/2020  . TETANUS/TDAP  10/09/2025  . INFLUENZA VACCINE  Completed  . COVID-19 Vaccine  Completed  . PNA vac Low Risk Adult  Completed    Health Maintenance  There are no preventive care reminders to display for this patient.  Colorectal cancer screening: No longer required.   Lung Cancer Screening: (Low Dose CT Chest recommended if Age 54-80 years, 30 pack-year currently smoking OR have quit w/in 15years.) does not qualify.   Lung Cancer Screening Referral: NA  Additional Screening:  Hepatitis C Screening: does not qualify; Completed  NA  Vision Screening: Recommended annual ophthalmology exams for early detection of glaucoma and other disorders of the eye. Is the patient up to date with their annual eye exam?  Yes  Who is the provider or what is the name of the  office in which the patient attends annual eye exams? Eye doctor on [address] If pt is not established with a provider, would they like to be referred to a provider to establish care? No .   Dental Screening: Recommended annual dental exams for proper oral hygiene  Community Resource Referral / Chronic Care Management: CRR required this visit?  No   CCM required this visit?  No      Plan:     Ryan Bernard , Thank you for taking time to come for your Medicare Wellness Visit. I appreciate your ongoing commitment to your health goals. Please review the following plan we discussed and let me know if I can assist you in the future.   These are the goals we discussed: Goals    . Exercise 3x per week (30 min per time)     Increase exercise as tolerated. Using a pedal bike for exercise.       This is a list of the screening recommended for you and due dates:  Health Maintenance  Topic Date Due  . Eye exam for diabetics  01/19/2020*  . Complete foot exam   02/18/2020*  . Hemoglobin A1C  02/24/2020  . Tetanus Vaccine  10/09/2025  . Flu Shot  Completed  . COVID-19 Vaccine  Completed  . Pneumonia vaccines  Completed  *Topic was postponed. The date shown is not the original due date.    I have personally reviewed and noted the following in the patient's chart:   . Medical and social history . Use of alcohol, tobacco or illicit drugs  . Current medications and supplements . Functional ability and status . Nutritional status . Physical activity . Advanced directives . List of other physicians . Hospitalizations, surgeries, and ER visits in previous 12 months . Vitals . Screenings to include cognitive, depression, and falls . Referrals and  appointments  In addition, I have reviewed and discussed with patient certain preventive protocols, quality metrics, and best practice recommendations. A written personalized care plan for preventive services as well as general preventive health recommendations were provided to patient.   Thayer Ohm, DNP, APRN, FNP-BC [facility]

## 2020-01-24 ENCOUNTER — Other Ambulatory Visit: Payer: Self-pay

## 2020-01-24 ENCOUNTER — Ambulatory Visit (INDEPENDENT_AMBULATORY_CARE_PROVIDER_SITE_OTHER): Payer: Medicare Other | Admitting: Osteopathic Medicine

## 2020-01-24 ENCOUNTER — Encounter: Payer: Self-pay | Admitting: Osteopathic Medicine

## 2020-01-24 ENCOUNTER — Ambulatory Visit (INDEPENDENT_AMBULATORY_CARE_PROVIDER_SITE_OTHER): Payer: Medicare Other

## 2020-01-24 VITALS — BP 159/91 | HR 109 | Temp 97.6°F | Wt 148.1 lb

## 2020-01-24 DIAGNOSIS — I10 Essential (primary) hypertension: Secondary | ICD-10-CM | POA: Diagnosis not present

## 2020-01-24 DIAGNOSIS — M5031 Other cervical disc degeneration,  high cervical region: Secondary | ICD-10-CM | POA: Diagnosis not present

## 2020-01-24 DIAGNOSIS — M542 Cervicalgia: Secondary | ICD-10-CM | POA: Diagnosis not present

## 2020-01-24 MED ORDER — MELOXICAM 7.5 MG PO TABS: 7.5000 mg | ORAL_TABLET | Freq: Every day | ORAL | 0 refills | Status: DC

## 2020-01-24 MED ORDER — PREDNISONE 20 MG PO TABS: 20.0000 mg | ORAL_TABLET | Freq: Two times a day (BID) | ORAL | 0 refills | Status: DC

## 2020-01-24 MED ORDER — OXYCODONE-ACETAMINOPHEN 5-325 MG PO TABS: 1.0000 | ORAL_TABLET | Freq: Three times a day (TID) | ORAL | 0 refills | Status: DC | PRN

## 2020-01-24 MED ORDER — MELOXICAM 7.5 MG PO TABS
7.5000 mg | ORAL_TABLET | Freq: Every day | ORAL | 0 refills | Status: DC
Start: 2020-01-24 — End: 2020-08-14

## 2020-01-24 MED ORDER — KETOROLAC TROMETHAMINE 30 MG/ML IJ SOLN
30.0000 mg | Freq: Once | INTRAMUSCULAR | Status: AC
  Administered 2020-01-24: 30 mg via INTRAMUSCULAR

## 2020-01-24 MED ORDER — PREDNISONE 20 MG PO TABS
20.0000 mg | ORAL_TABLET | Freq: Two times a day (BID) | ORAL | 0 refills | Status: DC
Start: 2020-01-24 — End: 2020-08-14

## 2020-01-24 NOTE — Patient Instructions (Signed)
Plan: See printed instructions for home exercises / stretches Xray today - probably arthritis  Shot of anti-inflammatory today Other prescriptions:  Steroids (predisone) burst for 5 days  Anti-inflammatory (meloxicam) take daily for 5-7 days then can keep on had for use as-needed after that  Severe pain (oxycodone) medication to use as needed  If not better / if worse, please let me know and I can refer for formal physical therapy Please contact our office for sports medicine appointment with Dr T if PT not helping

## 2020-01-24 NOTE — Progress Notes (Signed)
Ryan Bernard is a 86 y.o. male who presents to  [facility]  today, 01/24/20, seeking care for the following:  . Neck pain  o ongoing >1 month, described as soreness in lower neck worse on L side but present on R side as well, limited range of motion, hurts to twist to the L. No injury.  o On exam: no midline spinal tenderness, limited active ROM to rotation bilaterally but worse to the L, 5/5 arm strength BL (shoulder abduction, elbow flexion)     ASSESSMENT & PLAN with other pertinent findings:  The primary encounter diagnosis was Neck pain. A diagnosis of Essential hypertension, benign was also pertinent to this visit.   Neck pain: MSK cause suspected. See below pt instructions   HTN: 140/86 at cardiology visit 12/24/19, home numbers a goal  BP Readings from Last 3 Encounters:  01/24/20 (!) 159/91  01/19/20 (!) 172/100  12/01/19 132/80       Patient Instructions  Plan: See printed instructions for home exercises / stretches Xray today - probably arthritis  Shot of anti-inflammatory today Other prescriptions:  Steroids (predisone) burst for 5 days  Anti-inflammatory (meloxicam) take daily for 5-7 days then can keep on had for use as-needed after that  Severe pain (oxycodone) medication to use as needed  If not better / if worse, please let me know and I can refer for formal physical therapy Please contact our office for sports medicine appointment with Dr T if PT not helping      Orders Placed This Encounter  Procedures  . DG Cervical Spine 2 or 3 views    Meds ordered this encounter  Medications  . DISCONTD: meloxicam (MOBIC) 7.5 MG tablet    Sig: Take 1 tablet (7.5 mg total) by mouth daily. As needed for pain. Start 01/25/20    Dispense:  30 tablet    Refill:  0  . DISCONTD: predniSONE (DELTASONE) 20 MG tablet    Sig: Take 1 tablet (20 mg total) by mouth 2 (two) times daily with a meal.     Dispense:  10 tablet    Refill:  0  . oxyCODONE-acetaminophen (PERCOCET/ROXICET) 5-325 MG tablet    Sig: Take 1 tablet by mouth every 8 (eight) hours as needed for severe pain.    Dispense:  5 tablet    Refill:  0  . ketorolac (TORADOL) 30 MG/ML injection 30 mg  . predniSONE (DELTASONE) 20 MG tablet    Sig: Take 1 tablet (20 mg total) by mouth 2 (two) times daily with a meal.    Dispense:  10 tablet    Refill:  0  . meloxicam (MOBIC) 7.5 MG tablet    Sig: Take 1 tablet (7.5 mg total) by mouth daily. As needed for pain. Start 01/25/20    Dispense:  30 tablet    Refill:  0       Follow-up instructions: Return for VISIT WITH SPORTS MEDICINE FOR ORTHOPEDIC ISSUE IF NO BETTER .                                         BP (!) 159/91 (BP Location: Left Arm, Patient Position: Sitting, Cuff Size: Normal)   Pulse (!) 109   Temp 97.6 F (36.4 C) (Oral)   Wt 148 lb 1.3 oz (67.2 kg)   BMI 23.19 kg/m  Current Meds  Medication Sig  . albuterol (PROVENTIL HFA;VENTOLIN HFA) 108 (90 Base) MCG/ACT inhaler Inhale 2 puffs into the lungs every 6 (six) hours as needed for wheezing.  Marland Kitchen apixaban (ELIQUIS) 5 MG TABS tablet Take 5 mg by mouth 2 (two) times daily.  . furosemide (LASIX) 20 MG tablet Take 1 tablet (20 mg total) by mouth daily.  . metoprolol succinate (TOPROL-XL) 100 MG 24 hr tablet Take 100 mg by mouth daily. Take with or immediately following a meal.  . pravastatin (PRAVACHOL) 80 MG tablet Take 1 tablet (80 mg total) by mouth at bedtime.  Marland Kitchen spironolactone (ALDACTONE) 25 MG tablet Take 25 mg by mouth daily.  Marland Kitchen terazosin (HYTRIN) 10 MG capsule Take 10 mg by mouth daily.      No results found for this or any previous visit (from the past 72 hour(s)).  No results found.     All questions at time of visit were answered - patient instructed to contact office with any additional concerns or updates.  ER/RTC precautions were reviewed with the  patient as applicable.   Please note: voice recognition software was used to produce this document, and typos may escape review. Please contact Dr. Lyn Hollingshead for any needed clarifications.

## 2020-01-26 DIAGNOSIS — R9439 Abnormal result of other cardiovascular function study: Secondary | ICD-10-CM | POA: Diagnosis not present

## 2020-01-26 DIAGNOSIS — I25810 Atherosclerosis of coronary artery bypass graft(s) without angina pectoris: Secondary | ICD-10-CM | POA: Diagnosis not present

## 2020-01-26 DIAGNOSIS — I482 Chronic atrial fibrillation, unspecified: Secondary | ICD-10-CM | POA: Diagnosis not present

## 2020-01-26 DIAGNOSIS — I10 Essential (primary) hypertension: Secondary | ICD-10-CM | POA: Diagnosis not present

## 2020-01-26 DIAGNOSIS — I255 Ischemic cardiomyopathy: Secondary | ICD-10-CM | POA: Diagnosis not present

## 2020-01-26 DIAGNOSIS — Z951 Presence of aortocoronary bypass graft: Secondary | ICD-10-CM | POA: Diagnosis not present

## 2020-01-31 DIAGNOSIS — I255 Ischemic cardiomyopathy: Secondary | ICD-10-CM | POA: Diagnosis not present

## 2020-01-31 DIAGNOSIS — I10 Essential (primary) hypertension: Secondary | ICD-10-CM | POA: Diagnosis not present

## 2020-01-31 DIAGNOSIS — I482 Chronic atrial fibrillation, unspecified: Secondary | ICD-10-CM | POA: Diagnosis not present

## 2020-02-09 DIAGNOSIS — I4819 Other persistent atrial fibrillation: Secondary | ICD-10-CM | POA: Diagnosis not present

## 2020-02-09 DIAGNOSIS — I25810 Atherosclerosis of coronary artery bypass graft(s) without angina pectoris: Secondary | ICD-10-CM | POA: Diagnosis not present

## 2020-02-09 DIAGNOSIS — Z951 Presence of aortocoronary bypass graft: Secondary | ICD-10-CM | POA: Diagnosis not present

## 2020-02-09 DIAGNOSIS — I119 Hypertensive heart disease without heart failure: Secondary | ICD-10-CM | POA: Diagnosis not present

## 2020-02-09 DIAGNOSIS — I255 Ischemic cardiomyopathy: Secondary | ICD-10-CM | POA: Diagnosis not present

## 2020-02-09 DIAGNOSIS — I350 Nonrheumatic aortic (valve) stenosis: Secondary | ICD-10-CM | POA: Diagnosis not present

## 2020-02-09 DIAGNOSIS — I2510 Atherosclerotic heart disease of native coronary artery without angina pectoris: Secondary | ICD-10-CM | POA: Diagnosis not present

## 2020-02-09 DIAGNOSIS — I2584 Coronary atherosclerosis due to calcified coronary lesion: Secondary | ICD-10-CM | POA: Diagnosis not present

## 2020-02-09 DIAGNOSIS — Z7901 Long term (current) use of anticoagulants: Secondary | ICD-10-CM | POA: Diagnosis not present

## 2020-02-10 DIAGNOSIS — Z7901 Long term (current) use of anticoagulants: Secondary | ICD-10-CM | POA: Diagnosis not present

## 2020-02-10 DIAGNOSIS — I2510 Atherosclerotic heart disease of native coronary artery without angina pectoris: Secondary | ICD-10-CM | POA: Diagnosis not present

## 2020-02-10 DIAGNOSIS — I4819 Other persistent atrial fibrillation: Secondary | ICD-10-CM | POA: Diagnosis not present

## 2020-02-10 DIAGNOSIS — I25810 Atherosclerosis of coronary artery bypass graft(s) without angina pectoris: Secondary | ICD-10-CM | POA: Diagnosis not present

## 2020-02-10 DIAGNOSIS — I255 Ischemic cardiomyopathy: Secondary | ICD-10-CM | POA: Diagnosis not present

## 2020-02-10 DIAGNOSIS — Z955 Presence of coronary angioplasty implant and graft: Secondary | ICD-10-CM | POA: Diagnosis not present

## 2020-02-10 DIAGNOSIS — Z951 Presence of aortocoronary bypass graft: Secondary | ICD-10-CM | POA: Diagnosis not present

## 2020-02-10 DIAGNOSIS — I350 Nonrheumatic aortic (valve) stenosis: Secondary | ICD-10-CM | POA: Diagnosis not present

## 2020-02-10 DIAGNOSIS — I119 Hypertensive heart disease without heart failure: Secondary | ICD-10-CM | POA: Diagnosis not present

## 2020-02-10 DIAGNOSIS — I2584 Coronary atherosclerosis due to calcified coronary lesion: Secondary | ICD-10-CM | POA: Diagnosis not present

## 2020-03-24 DIAGNOSIS — I25810 Atherosclerosis of coronary artery bypass graft(s) without angina pectoris: Secondary | ICD-10-CM | POA: Diagnosis not present

## 2020-03-24 DIAGNOSIS — I10 Essential (primary) hypertension: Secondary | ICD-10-CM | POA: Diagnosis not present

## 2020-03-24 DIAGNOSIS — I482 Chronic atrial fibrillation, unspecified: Secondary | ICD-10-CM | POA: Diagnosis not present

## 2020-04-03 DIAGNOSIS — I25810 Atherosclerosis of coronary artery bypass graft(s) without angina pectoris: Secondary | ICD-10-CM | POA: Diagnosis not present

## 2020-04-03 DIAGNOSIS — I482 Chronic atrial fibrillation, unspecified: Secondary | ICD-10-CM | POA: Diagnosis not present

## 2020-04-03 DIAGNOSIS — I10 Essential (primary) hypertension: Secondary | ICD-10-CM | POA: Diagnosis not present

## 2020-04-25 IMAGING — DX DG CHEST 2V
2 series · 2 of 2 positions shown · non-contrast
Comparison: PA and lateral chest x-ray July 17, 2017

CLINICAL DATA: Two week history of shortness of breath. Episode of
treated pneumonia in July 2017.

EXAM:
CHEST - 2 VIEW

[chest pa]
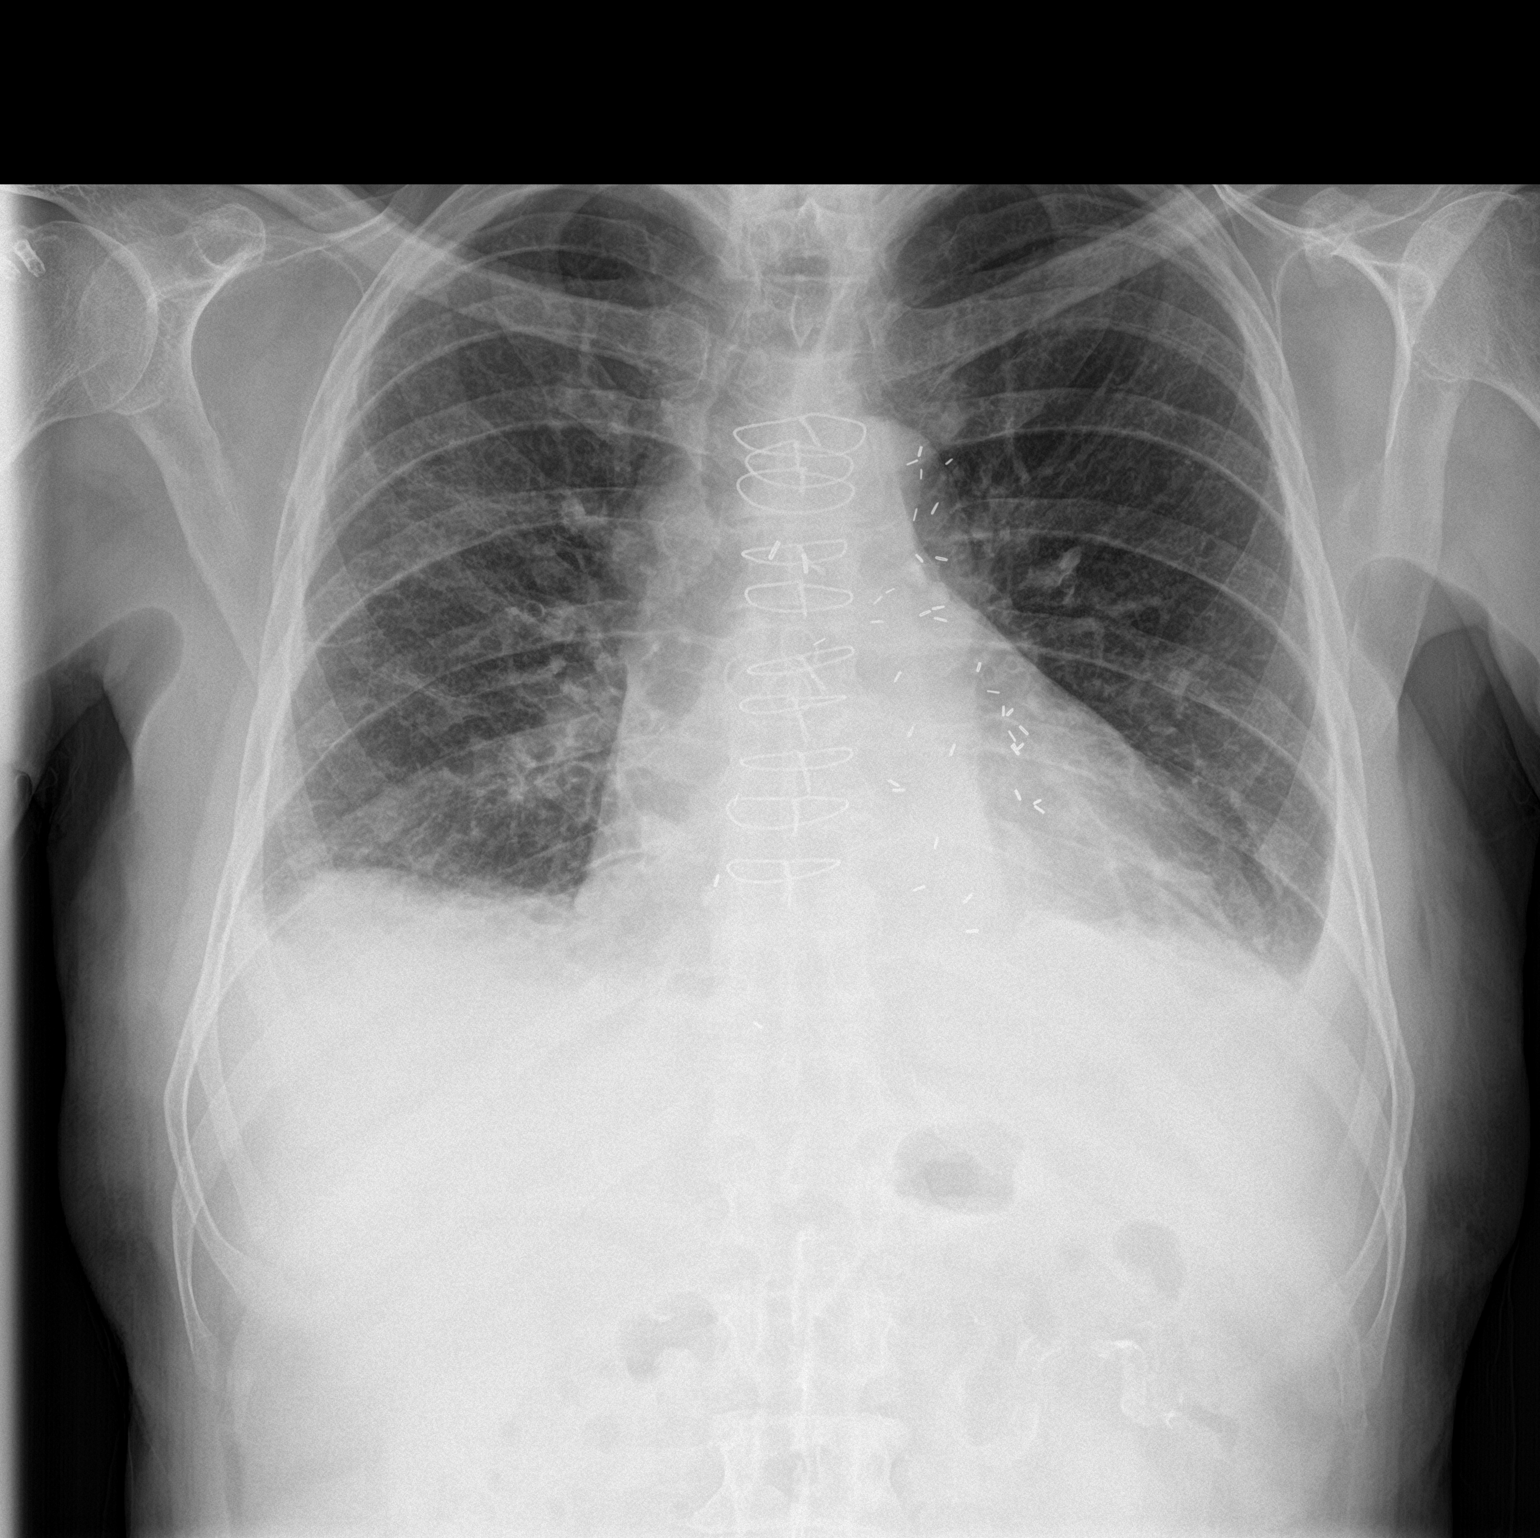

[chest lat]
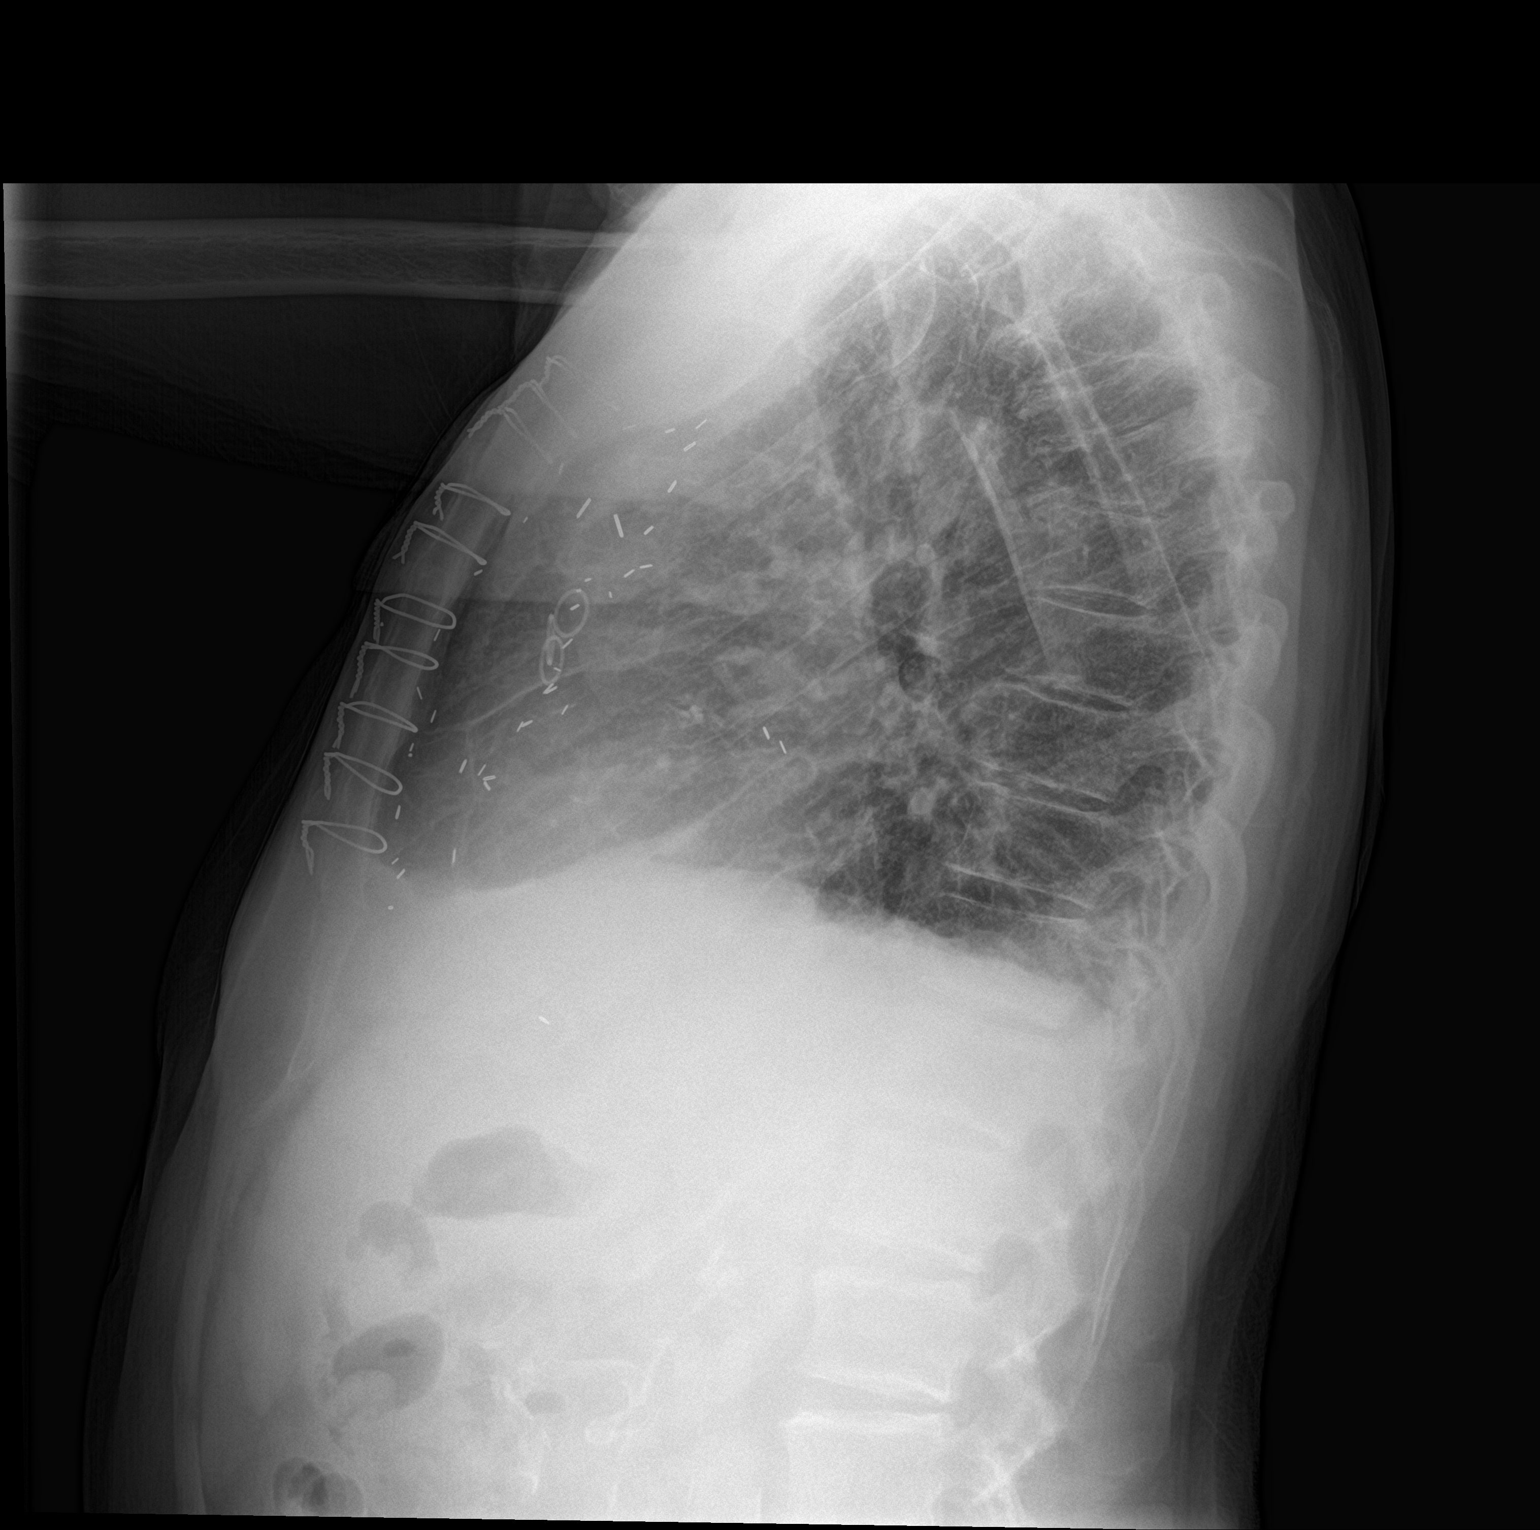

[2 of 2 positions shown; findings below may reference images not displayed]

FINDINGS: The lungs are adequately inflated. There small bilateral pleural
effusions that are more conspicuous today. The cardiac silhouette is
enlarged. The pulmonary vascularity is mildly prominent though
stable. There are post CABG changes. The bony thorax exhibits no
acute abnormality. There is stable partial compression of 2 upper to
midthoracic vertebral bodies.
IMPRESSION: Low-grade compensated CHF with small bilateral pleural effusions
more conspicuous than on the previous study. No alveolar pneumonia.
Previous CABG.

## 2020-06-22 DIAGNOSIS — I482 Chronic atrial fibrillation, unspecified: Secondary | ICD-10-CM | POA: Diagnosis not present

## 2020-06-22 DIAGNOSIS — I350 Nonrheumatic aortic (valve) stenosis: Secondary | ICD-10-CM | POA: Diagnosis not present

## 2020-06-22 DIAGNOSIS — I25810 Atherosclerosis of coronary artery bypass graft(s) without angina pectoris: Secondary | ICD-10-CM | POA: Diagnosis not present

## 2020-06-22 DIAGNOSIS — I255 Ischemic cardiomyopathy: Secondary | ICD-10-CM | POA: Diagnosis not present

## 2020-06-22 DIAGNOSIS — I10 Essential (primary) hypertension: Secondary | ICD-10-CM | POA: Diagnosis not present

## 2020-06-30 DIAGNOSIS — I350 Nonrheumatic aortic (valve) stenosis: Secondary | ICD-10-CM | POA: Diagnosis not present

## 2020-06-30 DIAGNOSIS — I255 Ischemic cardiomyopathy: Secondary | ICD-10-CM | POA: Diagnosis not present

## 2020-06-30 DIAGNOSIS — I10 Essential (primary) hypertension: Secondary | ICD-10-CM | POA: Diagnosis not present

## 2020-06-30 DIAGNOSIS — I4891 Unspecified atrial fibrillation: Secondary | ICD-10-CM | POA: Diagnosis not present

## 2020-06-30 DIAGNOSIS — I447 Left bundle-branch block, unspecified: Secondary | ICD-10-CM | POA: Diagnosis not present

## 2020-06-30 DIAGNOSIS — I482 Chronic atrial fibrillation, unspecified: Secondary | ICD-10-CM | POA: Diagnosis not present

## 2020-07-04 DIAGNOSIS — H524 Presbyopia: Secondary | ICD-10-CM | POA: Diagnosis not present

## 2020-07-06 DIAGNOSIS — I255 Ischemic cardiomyopathy: Secondary | ICD-10-CM | POA: Diagnosis not present

## 2020-07-06 DIAGNOSIS — I350 Nonrheumatic aortic (valve) stenosis: Secondary | ICD-10-CM | POA: Diagnosis not present

## 2020-07-06 DIAGNOSIS — I10 Essential (primary) hypertension: Secondary | ICD-10-CM | POA: Diagnosis not present

## 2020-07-06 DIAGNOSIS — I482 Chronic atrial fibrillation, unspecified: Secondary | ICD-10-CM | POA: Diagnosis not present

## 2020-07-10 LAB — HM DIABETES EYE EXAM

## 2020-08-14 ENCOUNTER — Other Ambulatory Visit: Payer: Self-pay

## 2020-08-14 ENCOUNTER — Encounter: Payer: Self-pay | Admitting: Family Medicine

## 2020-08-14 ENCOUNTER — Ambulatory Visit (INDEPENDENT_AMBULATORY_CARE_PROVIDER_SITE_OTHER): Payer: Medicare Other | Admitting: Family Medicine

## 2020-08-14 VITALS — BP 155/91 | HR 85 | Temp 97.8°F | Resp 18 | Wt 148.8 lb

## 2020-08-14 DIAGNOSIS — M109 Gout, unspecified: Secondary | ICD-10-CM

## 2020-08-14 MED ORDER — COLCHICINE 0.6 MG PO TABS: 0.6000 mg | ORAL_TABLET | Freq: Two times a day (BID) | ORAL | 2 refills | Status: DC | PRN

## 2020-08-14 NOTE — Progress Notes (Signed)
Acute Office Visit  Subjective:    Patient ID: Ryan Bernard, male    DOB: 1932-01-25, 87 y.o.   MRN: [id number]  Chief Complaint  Patient presents with  . Gout    HPI Patient is in today for right big toe pain.  Saturday night into Sunday morning, patient woke up with 10/10 right great toe pain with redness and swelling. At rest, discomfort is 3/10 but increases to 10/10 with standing, palpation - even the sheet lying on his toe causes great pain. Denies any injuries. States similar  in the past, but never bad enough to go to the doctor. He denies any diet changes or alcohol consumption. He had a leftover meloxicam which he took and got some improvement.    Past Medical History:  Diagnosis Date  . Anemia   . BPH (benign prostatic hyperplasia)   . CAD (coronary artery disease) 1995   w/ angioplasty  . Diabetes mellitus 2004   type 2  . Heart disease    Mild valvular w/ pulm HTN  . Hyperlipidemia   . Hypertension   . SBE (subacute bacterial endocarditis) prophylaxis candidate     Past Surgical History:  Procedure Laterality Date  . ANGIOPLASTY  1995   [facility]  . BACK SURGERY  1964  . CORONARY ARTERY BYPASS GRAFT  10/27/2009   5 vessel, Dr. Raoul Pitch  . HERNIA REPAIR     x 2     Family History  Problem Relation Age of Onset  . Heart disease Mother   . Hypertension Mother   . Alcohol abuse Father   . Depression Daughter   . Depression Son     Social History   Socioeconomic History  . Marital status: Widowed    Spouse name: Not on file  . Number of children: 2  . Years of education: 52  . Highest education level: 12th grade  Occupational History  . Occupation: retired    Comment: Insurance claims handler  Tobacco Use  . Smoking status: Never Smoker  . Smokeless tobacco: Never Used  Vaping Use  . Vaping Use: Never used  Substance and Sexual Activity  . Alcohol use: No  . Drug use: No  . Sexual activity: Not Currently  Other Topics Concern  . Not on  file  Social History Narrative   Sits around house and watches TV. Drinks 1 cup of coffee in the morning. Walks everyday for 1 hour   Social Determinants of Health   Financial Resource Strain: Not on file  Food Insecurity: Not on file  Transportation Needs: Not on file  Physical Activity: Not on file  Stress: Not on file  Social Connections: Not on file  Intimate Partner Violence: Not on file    Outpatient Medications Prior to Visit  Medication Sig Dispense Refill  . albuterol (PROVENTIL HFA;VENTOLIN HFA) 108 (90 Base) MCG/ACT inhaler Inhale 2 puffs into the lungs every 6 (six) hours as needed for wheezing. 2 Inhaler 11  . apixaban (ELIQUIS) 5 MG TABS tablet Take 5 mg by mouth 2 (two) times daily.    . furosemide (LASIX) 20 MG tablet Take 1 tablet (20 mg total) by mouth daily.    . metoprolol succinate (TOPROL-XL) 100 MG 24 hr tablet Take 100 mg by mouth daily. Take with or immediately following a meal.    . oxyCODONE-acetaminophen (PERCOCET/ROXICET) 5-325 MG tablet Take 1 tablet by mouth every 8 (eight) hours as needed for severe pain. 5 tablet 0  . pravastatin (  PRAVACHOL) 80 MG tablet Take 1 tablet (80 mg total) by mouth at bedtime. 90 tablet 3  . terazosin (HYTRIN) 10 MG capsule Take 10 mg by mouth daily.      . meloxicam (MOBIC) 7.5 MG tablet Take 1 tablet (7.5 mg total) by mouth daily. As needed for pain. Start 01/25/20 30 tablet 0  . predniSONE (DELTASONE) 20 MG tablet Take 1 tablet (20 mg total) by mouth 2 (two) times daily with a meal. 10 tablet 0  . spironolactone (ALDACTONE) 25 MG tablet Take 25 mg by mouth daily.     No facility-administered medications prior to visit.    No Known Allergies  Review of Systems All review of systems negative except what is listed in the HPI     Objective:    Physical Exam Vitals reviewed.  Constitutional:      Appearance: Normal appearance.  Musculoskeletal:        General: Normal range of motion.     Comments: R great toe MTP  with mild edema, erythema, warmth, and  tenderness; no lesions  Skin:    Capillary Refill: Capillary refill takes less than 2 seconds.  Neurological:     General: No focal deficit present.     Mental Status: He is alert and oriented to person, place, and time.  Psychiatric:        Mood and Affect: Mood normal.        Behavior: Behavior normal.        Thought Content: Thought content normal.        Judgment: Judgment normal.     BP (!) 155/91   Pulse 85   Temp 97.8 F (36.6 C)   Resp 18   Wt 148 lb 12 oz (67.5 kg)   SpO2 92%   BMI 23.30 kg/m  Wt Readings from Last 3 Encounters:  08/14/20 148 lb 12 oz (67.5 kg)  01/24/20 148 lb 1.3 oz (67.2 kg)  01/19/20 148 lb 12.8 oz (67.5 kg)    Health Maintenance Due  Topic Date Due  . Pneumococcal Vaccine 100-61 Years old (1 of 4 - PCV13) Never done  . Zoster Vaccines- Shingrix (1 of 2) Never done  . FOOT EXAM  11/01/2016  . OPHTHALMOLOGY EXAM  04/25/2018  . HEMOGLOBIN A1C  02/24/2020    There are no preventive care reminders to display for this patient.   Lab Results  Component Value Date   TSH 10.66 (H) 08/25/2019   Lab Results  Component Value Date   WBC 5.4 03/05/2018   HGB 14.2 03/05/2018   HCT 40.7 03/05/2018   MCV 92.1 03/05/2018   PLT 146 03/05/2018   Lab Results  Component Value Date   NA 143 08/25/2019   K 4.2 08/25/2019   CO2 32 08/25/2019   GLUCOSE 139 (H) 08/25/2019   BUN 42 (H) 08/25/2019   CREATININE 1.67 (H) 08/25/2019   BILITOT 0.5 03/05/2018   ALKPHOS 69 11/02/2015   AST 15 03/05/2018   ALT 8 (L) 03/05/2018   PROT 6.9 03/05/2018   ALBUMIN 4.0 11/02/2015   CALCIUM 9.7 08/25/2019   ANIONGAP 29 05/27/2007   Lab Results  Component Value Date   CHOL 148 03/05/2018   Lab Results  Component Value Date   HDL 46 03/05/2018   Lab Results  Component Value Date   LDLCALC 83 03/05/2018   Lab Results  Component Value Date   TRIG 97 03/05/2018   Lab Results  Component Value Date  CHOLHDL 3.2  03/05/2018   Lab Results  Component Value Date   HGBA1C 6.7 (H) 08/25/2019       Assessment & Plan:   1. Acute gout involving toe of right foot, unspecified cause Presentation consistent with gout but will go ahead and check CBC and uric acid as this is new for him. Trying to avoid NSAIDs since patient is on Eliquis. Will let him try PRN colchicine during flare. Education provided. Can do further workup if not responding (imaging, sports med/rheum referral for aspiration, prophylactic therapy if recurrent, etc.) Patient aware of signs/symptoms requiring further/urgent evaluation.  - colchicine 0.6 MG tablet; Take 1 tablet (0.6 mg total) by mouth 2 (two) times daily as needed. Until pain resolves.  Dispense: 60 tablet; Refill: 2 - CBC - Uric acid  Follow-up if symptoms worsen or fail to improve.   Clayborne Dana, NP

## 2020-08-14 NOTE — Patient Instructions (Signed)
Gout  Gout is a condition that causes painful swelling of the joints. Gout is a type of inflammation of the joints (arthritis). This condition is caused by having too much uric acid in the body. Uric acid is a chemical that forms when the body breaks down substances called purines. Purines are important for building body proteins. When the body has too much uric acid, sharp crystals can form and build up inside the joints. This causes pain and swelling. Gout attacks can happen quickly and may be very painful (acute gout). Over time, the attacks can affect more joints and become more frequent (chronic gout). Gout can also cause uric acid to build up under the skin and inside the kidneys. What are the causes? This condition is caused by too much uric acid in your blood. This can happen because:  Your kidneys do not remove enough uric acid from your blood. This is the most common cause.  Your body makes too much uric acid. This can happen with some cancers and cancer treatments. It can also occur if your body is breaking down too many red blood cells (hemolytic anemia).  You eat too many foods that are high in purines. These foods include organ meats and some seafood. Alcohol, especially beer, is also high in purines. A gout attack may be triggered by trauma or stress. What increases the risk? You are more likely to develop this condition if you:  Have a family history of gout.  Are male and middle-aged.  Are male and have gone through menopause.  Are obese.  Frequently drink alcohol, especially beer.  Are dehydrated.  Lose weight too quickly.  Have an organ transplant.  Have lead poisoning.  Take certain medicines, including aspirin, cyclosporine, diuretics, levodopa, and niacin.  Have kidney disease.  Have a skin condition called psoriasis. What are the signs or symptoms? An attack of acute gout happens quickly. It usually occurs in just one joint. The most common place is  the big toe. Attacks often start at night. Other joints that may be affected include joints of the feet, ankle, knee, fingers, wrist, or elbow. Symptoms of this condition may include:  Severe pain.  Warmth.  Swelling.  Stiffness.  Tenderness. The affected joint may be very painful to touch.  Shiny, red, or purple skin.  Chills and fever. Chronic gout may cause symptoms more frequently. More joints may be involved. You may also have white or yellow lumps (tophi) on your hands or feet or in other areas near your joints.   How is this diagnosed? This condition is diagnosed based on your symptoms, medical history, and physical exam. You may have tests, such as:  Blood tests to measure uric acid levels.  Removal of joint fluid with a thin needle (aspiration) to look for uric acid crystals.  X-rays to look for joint damage. How is this treated? Treatment for this condition has two phases: treating an acute attack and preventing future attacks. Acute gout treatment may include medicines to reduce pain and swelling, including:  NSAIDs.  Steroids. These are strong anti-inflammatory medicines that can be taken by mouth (orally) or injected into a joint.  Colchicine. This medicine relieves pain and swelling when it is taken soon after an attack. It can be given by mouth or through an IV. Preventive treatment may include:  Daily use of smaller doses of NSAIDs or colchicine.  Use of a medicine that reduces uric acid levels in your blood.  Changes to your diet.   You may need to see a dietitian about what to eat and drink to prevent gout. Follow these instructions at home: During a gout attack  If directed, put ice on the affected area: ? Put ice in a plastic bag. ? Place a towel between your skin and the bag. ? Leave the ice on for 20 minutes, 2-3 times a day.  Raise (elevate) the affected joint above the level of your heart as often as possible.  Rest the joint as much as possible.  If the affected joint is in your leg, you may be given crutches to use.  Follow instructions from your health care provider about eating or drinking restrictions.   Avoiding future gout attacks  Follow a low-purine diet as told by your dietitian or health care provider. Avoid foods and drinks that are high in purines, including liver, kidney, anchovies, asparagus, herring, mushrooms, mussels, and beer.  Maintain a healthy weight or lose weight if you are overweight. If you want to lose weight, talk with your health care provider. It is important that you do not lose weight too quickly.  Start or maintain an exercise program as told by your health care provider. Eating and drinking  Drink enough fluids to keep your urine pale yellow.  If you drink alcohol: ? Limit how much you use to:  0-1 drink a day for women.  0-2 drinks a day for men. ? Be aware of how much alcohol is in your drink. In the [place] one drink equals one 12 oz bottle of beer (355 mL) one 5 oz glass of wine (148 mL), or one 1 oz glass of hard liquor (44 mL). General instructions  Take over-the-counter and prescription medicines only as told by your health care provider.  Do not drive or use heavy machinery while taking prescription pain medicine.  Return to your normal activities as told by your health care provider. Ask your health care provider what activities are safe for you.  Keep all follow-up visits as told by your health care provider. This is important. Contact a health care provider if you have:  Another gout attack.  Continuing symptoms of a gout attack after 10 days of treatment.  Side effects from your medicines.  Chills or a fever.  Burning pain when you urinate.  Pain in your lower back or belly. Get help right away if you:  Have severe or uncontrolled pain.  Cannot urinate. Summary  Gout is painful swelling of the joints caused by inflammation.  The most common site of pain is the big  toe, but it can affect other joints in the body.  Medicines and dietary changes can help to prevent and treat gout attacks. This information is not intended to replace advice given to you by your health care provider. Make sure you discuss any questions you have with your health care provider. Document Revised: 09/17/2017 Document Reviewed: 09/17/2017 Elsevier Patient Education  2021 Elsevier Inc.  

## 2020-08-15 LAB — CBC
HCT: 38.1 % — ABNORMAL LOW (ref 38.5–50.0)
Hemoglobin: 13 g/dL — ABNORMAL LOW (ref 13.2–17.1)
MCH: 31.9 pg (ref 27.0–33.0)
MCHC: 34.1 g/dL (ref 32.0–36.0)
MCV: 93.4 fL (ref 80.0–100.0)
MPV: 11.4 fL (ref 7.5–12.5)
Platelets: 130 10*3/uL — ABNORMAL LOW (ref 140–400)
RBC: 4.08 10*6/uL — ABNORMAL LOW (ref 4.20–5.80)
RDW: 12.8 % (ref 11.0–15.0)
WBC: 5.6 10*3/uL (ref 3.8–10.8)

## 2020-08-15 LAB — URIC ACID: Uric Acid, Serum: 7.9 mg/dL (ref 4.0–8.0)

## 2020-08-15 NOTE — Progress Notes (Signed)
MyChart message sent - Uric acid levels were on the higher end of normal and no signs of infection. Try the colchicine as we discussed and if not improving or you begin having more flares, please let us know. Hope you feel better soon!

## 2020-09-27 ENCOUNTER — Ambulatory Visit (INDEPENDENT_AMBULATORY_CARE_PROVIDER_SITE_OTHER): Payer: Medicare Other | Admitting: Sports Medicine

## 2020-09-27 ENCOUNTER — Encounter: Payer: Self-pay | Admitting: Sports Medicine

## 2020-09-27 ENCOUNTER — Other Ambulatory Visit: Payer: Self-pay

## 2020-09-27 ENCOUNTER — Ambulatory Visit (INDEPENDENT_AMBULATORY_CARE_PROVIDER_SITE_OTHER): Payer: Medicare Other

## 2020-09-27 DIAGNOSIS — R0602 Shortness of breath: Secondary | ICD-10-CM | POA: Diagnosis not present

## 2020-09-27 DIAGNOSIS — R059 Cough, unspecified: Secondary | ICD-10-CM

## 2020-09-27 MED ORDER — AZITHROMYCIN 250 MG PO TABS: ORAL_TABLET | ORAL | 0 refills | Status: DC

## 2020-09-27 MED ORDER — PREDNISONE 50 MG PO TABS: 50.0000 mg | ORAL_TABLET | Freq: Every day | ORAL | 0 refills | Status: DC

## 2020-09-27 NOTE — Progress Notes (Signed)
    Procedures performed today:    None.  Independent interpretation of notes and tests performed by another provider:   None.  Brief History, Exam, Impression, and Recommendations:    Shortness of breath This is a pleasant 87 year old male, he has a history of systolic congestive heart failure, CAD. Last echocardiogram was a couple of months ago, both echo and stress echo, EF 30 to 35%, moderate aortic stenosis. Last NT proBNP was end of April at 4498. He does have a cardiologist, last visit was in April also complaining of dyspnea with exertion. There was a plan for consideration of TAVR. Today he has been complaining of increased shortness of breath, coughing since Sunday, no fevers, chills. Home COVID test was negative today. No leg swelling, no obvious orthopnea or PND. His exam is for the most part unremarkable, he does have a 3/6 systolic ejection murmur heard over the precordium, he has prolonged expiratory phase from a pulmonary standpoint but no wheezes, rales, crackles. No lower extremity edema. Speaking full sentences, no nasal flaring. Considering coughing, prolonged expiratory phase we will do a course of steroids and azithromycin, I would like a chest x-ray, labs including CBC, CMP, NT proBNP. I would also like him to use his home nebulizer machine as needed. He has a visit coming up with his cardiologist in a week or so. He can follow-up with [place] in 2 weeks.  This is a chronic illness potentially with exacerbation, potentially life-threatening, we did make the decision today not to hospitalize.    ___________________________________________ Ihor Austin. Benjamin Stain, M.D., ABFM., CAQSM. Primary Care and Sports Medicine [place] [facility]  Adjunct Instructor of Family Medicine  [employer]

## 2020-09-27 NOTE — Addendum Note (Signed)
Addended by: Monica Becton on: 09/27/2020 11:25 AM   Modules accepted: Orders

## 2020-09-27 NOTE — Assessment & Plan Note (Addendum)
This is a pleasant 87 year old male, he has a history of systolic congestive heart failure, CAD. Last echocardiogram was a couple of months ago, both echo and stress echo, EF 30 to 35%, moderate aortic stenosis. Last NT proBNP was end of April at 4498. He does have a cardiologist, last visit was in April also complaining of dyspnea with exertion. There was a plan for consideration of TAVR. Today he has been complaining of increased shortness of breath, coughing since Sunday, no fevers, chills. Home COVID test was negative today. No leg swelling, no obvious orthopnea or PND. His exam is for the most part unremarkable, he does have a 3/6 systolic ejection murmur heard over the precordium, he has prolonged expiratory phase from a pulmonary standpoint but no wheezes, rales, crackles. No lower extremity edema. Speaking full sentences, no nasal flaring. Considering coughing, prolonged expiratory phase we will do a course of steroids and azithromycin, I would like a chest x-ray, labs including CBC, CMP, NT proBNP. I would also like him to use his home nebulizer machine as needed. He has a visit coming up with his cardiologist in a week or so. He can follow-up with [place] in 2 weeks.  This is a chronic illness potentially with exacerbation, potentially life-threatening, we did make the decision today not to hospitalize.

## 2020-10-01 LAB — COMPREHENSIVE METABOLIC PANEL
AG Ratio: 1.4 (calc) (ref 1.0–2.5)
ALT: 11 U/L (ref 9–46)
AST: 17 U/L (ref 10–35)
Albumin: 4.3 g/dL (ref 3.6–5.1)
Alkaline phosphatase (APISO): 82 U/L (ref 35–144)
BUN: 19 mg/dL (ref 7–25)
CO2: 34 mmol/L — ABNORMAL HIGH (ref 20–32)
Calcium: 9.6 mg/dL (ref 8.6–10.3)
Chloride: 100 mmol/L (ref 98–110)
Creat: 1.12 mg/dL (ref 0.70–1.22)
Globulin: 3.1 g/dL (calc) (ref 1.9–3.7)
Glucose, Bld: 122 mg/dL — ABNORMAL HIGH (ref 65–99)
Potassium: 4.2 mmol/L (ref 3.5–5.3)
Sodium: 142 mmol/L (ref 135–146)
Total Bilirubin: 1 mg/dL (ref 0.2–1.2)
Total Protein: 7.4 g/dL (ref 6.1–8.1)

## 2020-10-01 LAB — CBC WITH DIFFERENTIAL/PLATELET
Absolute Monocytes: 390 cells/uL (ref 200–950)
Basophils Absolute: 21 cells/uL (ref 0–200)
Basophils Relative: 0.4 %
Eosinophils Absolute: 99 cells/uL (ref 15–500)
Eosinophils Relative: 1.9 %
HCT: 40.9 % (ref 38.5–50.0)
Hemoglobin: 13.8 g/dL (ref 13.2–17.1)
Lymphs Abs: 1227 cells/uL (ref 850–3900)
MCH: 31.7 pg (ref 27.0–33.0)
MCHC: 33.7 g/dL (ref 32.0–36.0)
MCV: 93.8 fL (ref 80.0–100.0)
MPV: 10.5 fL (ref 7.5–12.5)
Monocytes Relative: 7.5 %
Neutro Abs: 3463 cells/uL (ref 1500–7800)
Neutrophils Relative %: 66.6 %
Platelets: 175 10*3/uL (ref 140–400)
RBC: 4.36 10*6/uL (ref 4.20–5.80)
RDW: 13 % (ref 11.0–15.0)
Total Lymphocyte: 23.6 %
WBC: 5.2 10*3/uL (ref 3.8–10.8)

## 2020-10-01 LAB — NT-PROBNP: Pro B Natriuretic peptide (BNP): 5947 pg/mL — ABNORMAL HIGH

## 2020-10-11 ENCOUNTER — Ambulatory Visit (INDEPENDENT_AMBULATORY_CARE_PROVIDER_SITE_OTHER): Payer: Medicare Other | Admitting: Osteopathic Medicine

## 2020-10-11 ENCOUNTER — Encounter: Payer: Self-pay | Admitting: Osteopathic Medicine

## 2020-10-11 VITALS — BP 153/89 | HR 87 | Temp 97.7°F | Wt 139.1 lb

## 2020-10-11 DIAGNOSIS — Z951 Presence of aortocoronary bypass graft: Secondary | ICD-10-CM

## 2020-10-11 DIAGNOSIS — I5020 Unspecified systolic (congestive) heart failure: Secondary | ICD-10-CM | POA: Diagnosis not present

## 2020-10-11 NOTE — Progress Notes (Signed)
Ryan Bernard is a 87 y.o. male who presents to  [facility]  today, 10/11/20, seeking care for the following:  Follow-up CHF.  Seen recently for shortness of breath by one of my colleagues in office and advised follow-up, patient has actually seen cardiology since that visit and medications were adjusted.  Current list as below now includes spironolactone 25 mg daily, empagliflozin 10 mg daily.  Patient reports he is tolerating these medications well, no concerns at this point with dyspnea/orthopnea.     ASSESSMENT & PLAN with other pertinent findings:  The primary encounter diagnosis was ACC/AHA stage C heart failure with reduced ejection fraction (HCC). A diagnosis of S/P CABG x 5 was also pertinent to this visit.   Continue follow-up as directed with cardiology, patient is stable at this point and no concerns.  There are no Patient Instructions on file for this visit.  No orders of the defined types were placed in this encounter.   No orders of the defined types were placed in this encounter.    See below for relevant physical exam findings  See below for recent lab and imaging results reviewed  Medications, allergies, PMH, PSH, SocH, FamH reviewed below    Follow-up instructions: Return if symptoms worsen.                                        Exam:  BP (!) 153/89 (BP Location: Left Arm, Patient Position: Sitting, Cuff Size: Small)   Pulse 87   Temp 97.7 F (36.5 C) (Oral)   Wt 139 lb 1.9 oz (63.1 kg)   SpO2 93%   BMI 21.79 kg/m  Constitutional: VS see above. General Appearance: alert, well-developed, well-nourished, NAD Neck: No masses, trachea midline.  Respiratory: Normal respiratory effort. no wheeze, no rhonchi, no rales Cardiovascular: S1/S2 normal, +2/6 systolic  murmur, no rub/gallop auscultated. RRR.  Musculoskeletal: Gait normal. Symmetric and independent  movement of all extremities Neurological: Normal balance/coordination. No tremor. Skin: warm, dry, intact.  Psychiatric: Normal judgment/insight. Normal mood and affect. Oriented x3.   Current Meds  Medication Sig   albuterol (PROVENTIL HFA;VENTOLIN HFA) 108 (90 Base) MCG/ACT inhaler Inhale 2 puffs into the lungs every 6 (six) hours as needed for wheezing.   apixaban (ELIQUIS) 5 MG TABS tablet Take 5 mg by mouth 2 (two) times daily.   colchicine 0.6 MG tablet Take 1 tablet (0.6 mg total) by mouth 2 (two) times daily as needed. Until pain resolves.   Cyanocobalamin ([employer] B12 GUMMIES PO) Take 1 each by mouth daily.   empagliflozin (JARDIANCE) 10 MG TABS tablet Take 1 tablet by mouth every morning.   furosemide (LASIX) 20 MG tablet Take 1 tablet (20 mg total) by mouth daily.   metoprolol succinate (TOPROL-XL) 100 MG 24 hr tablet Take 100 mg by mouth daily. Take with or immediately following a meal.   oxyCODONE-acetaminophen (PERCOCET/ROXICET) 5-325 MG tablet Take 1 tablet by mouth every 8 (eight) hours as needed for severe pain.   pravastatin (PRAVACHOL) 80 MG tablet Take 1 tablet (80 mg total) by mouth at bedtime.   spironolactone (ALDACTONE) 25 MG tablet Take 1 tablet by mouth daily.   terazosin (HYTRIN) 10 MG capsule Take 10 mg by mouth daily.      No Known Allergies  Patient Active Problem List   Diagnosis Date Noted   Shortness  of breath 09/27/2020   Atrial fibrillation (HCC) 07/02/2018   Subclinical hypothyroidism 03/06/2018   ACC/AHA stage C heart failure with reduced ejection fraction (HCC) 09/08/2017   History of pneumonia 07/17/2017   Prediabetes 09/19/2014   Postherpetic neuralgia 06/10/2013   Chronic kidney disease, stage 3 (HCC) 05/06/2012   Hyperlipidemia LDL goal < 70 05/05/2012   S/P CABG x 5 05/05/2012   MICROALBUMINURIA 08/07/2007   PULMONARY HYPERTENSION 04/07/2007   Essential hypertension, benign 03/13/2007   Coronary atherosclerosis 03/13/2007   SOLAR KERATOSIS  03/13/2007   BENIGN PROSTATIC HYPERTROPHY, HX OF 03/13/2007    Family History  Problem Relation Age of Onset   Heart disease Mother    Hypertension Mother    Alcohol abuse Father    Depression Daughter    Depression Son     Social History   Tobacco Use  Smoking Status Never  Smokeless Tobacco Never    Past Surgical History:  Procedure Laterality Date   ANGIOPLASTY  1995   [facility] cardiology   BACK SURGERY  1964   CORONARY ARTERY BYPASS GRAFT  10/27/2009   5 vessel, Dr. Raoul Pitch   HERNIA REPAIR     x 2     Immunization History  Administered Date(s) Administered   Fluad Quad(high Dose 65+) 11/02/2018, 12/21/2019   Influenza Split 12/24/2011, 12/09/2012, 12/09/2013   Influenza Whole 01/09/2009, 12/20/2009, 12/28/2010   Influenza, High Dose Seasonal PF 12/09/2009, 01/10/2011, 12/10/2011, 11/02/2015, 10/09/2016, 10/30/2016, 12/01/2017   Influenza, Seasonal, Injecte, Preservative Fre 11/06/2012, 12/14/2013, 01/10/2015   Influenza,inj,Quad PF,6+ Mos 11/06/2012, 12/14/2013, 01/10/2015   Influenza-Unspecified 12/09/2000, 12/09/2001, 12/10/2002, 01/16/2004, 01/09/2005, 01/09/2006, 01/10/2007, 11/24/2007, 01/09/2009, 11/02/2015, 10/30/2016, 12/01/2017, 12/09/2018, 12/21/2019   Moderna Sars-Covid-2 Vaccination 04/23/2019, 05/21/2019, 09/20/2019, 03/13/2020   PFIZER(Purple Top)SARS-COV-2 Vaccination 05/04/2019, 05/25/2019   Pneumococcal Conjugate-13 03/16/2014   Pneumococcal Polysaccharide-23 08/20/2011   Pneumococcal-Unspecified 12/09/2000   Td 12/10/2002   Tdap 10/10/2015   Tetanus 09/08/2009   Zoster Recombinat (Shingrix) 06/23/2020, 08/22/2020    Recent Results (from the past 2160 hour(s))  CBC     Status: Abnormal   Collection Time: 08/14/20 12:00 AM  Result Value Ref Range   WBC 5.6 3.8 - 10.8 Thousand/uL   RBC 4.08 (L) 4.20 - 5.80 Million/uL   Hemoglobin 13.0 (L) 13.2 - 17.1 g/dL   HCT 10.9 (L) 32.3 - 55.7 %   MCV 93.4 80.0 - 100.0 fL   MCH 31.9 27.0 - 33.0 pg    MCHC 34.1 32.0 - 36.0 g/dL   RDW 32.2 02.5 - 42.7 %   Platelets 130 (L) 140 - 400 Thousand/uL   MPV 11.4 7.5 - 12.5 fL  Uric acid     Status: None   Collection Time: 08/14/20 12:00 AM  Result Value Ref Range   Uric Acid, Serum 7.9 4.0 - 8.0 mg/dL    Comment: Therapeutic target for gout patients: <6.0 mg/dL .   CBC with Differential/Platelet     Status: None   Collection Time: 09/27/20 12:00 AM  Result Value Ref Range   WBC 5.2 3.8 - 10.8 Thousand/uL   RBC 4.36 4.20 - 5.80 Million/uL   Hemoglobin 13.8 13.2 - 17.1 g/dL   HCT 06.2 37.6 - 28.3 %   MCV 93.8 80.0 - 100.0 fL   MCH 31.7 27.0 - 33.0 pg   MCHC 33.7 32.0 - 36.0 g/dL   RDW 15.1 76.1 - 60.7 %   Platelets 175 140 - 400 Thousand/uL   MPV 10.5 7.5 - 12.5 fL   Neutro Abs  3,463 1,500 - 7,800 cells/uL   Lymphs Abs 1,227 850 - 3,900 cells/uL   Absolute Monocytes 390 200 - 950 cells/uL   Eosinophils Absolute 99 15 - 500 cells/uL   Basophils Absolute 21 0 - 200 cells/uL   Neutrophils Relative % 66.6 %   Total Lymphocyte 23.6 %   Monocytes Relative 7.5 %   Eosinophils Relative 1.9 %   Basophils Relative 0.4 %  Comprehensive metabolic panel     Status: Abnormal   Collection Time: 09/27/20 12:00 AM  Result Value Ref Range   Glucose, Bld 122 (H) 65 - 99 mg/dL    Comment: .            Fasting reference interval . For someone without known diabetes, a glucose value between 100 and 125 mg/dL is consistent with prediabetes and should be confirmed with a follow-up test. .    BUN 19 7 - 25 mg/dL   Creat 9.32 6.71 - 2.45 mg/dL   BUN/Creatinine Ratio NOT APPLICABLE 6 - 22 (calc)   Sodium 142 135 - 146 mmol/L   Potassium 4.2 3.5 - 5.3 mmol/L   Chloride 100 98 - 110 mmol/L   CO2 34 (H) 20 - 32 mmol/L   Calcium 9.6 8.6 - 10.3 mg/dL   Total Protein 7.4 6.1 - 8.1 g/dL   Albumin 4.3 3.6 - 5.1 g/dL   Globulin 3.1 1.9 - 3.7 g/dL (calc)   AG Ratio 1.4 1.0 - 2.5 (calc)   Total Bilirubin 1.0 0.2 - 1.2 mg/dL   Alkaline phosphatase  (APISO) 82 35 - 144 U/L   AST 17 10 - 35 U/L   ALT 11 9 - 46 U/L  NT-proBNP     Status: Abnormal   Collection Time: 09/27/20 12:00 AM  Result Value Ref Range   Pro B Natriuretic peptide (BNP) 5,947 (H) pg/mL    Comment: . For Heart Failure (HF) diagnosis, reference ranges in patients with dyspnea are based on Januzzi JL, Et al. Am Coll Cardiol. 2018;71:1191-1200. Marland Kitchen 18-49 years: <= 300 pg/mL Normal, HF unlikely >= 450 pg/mL High probability of HF 50-75 years: <= 300 pg/mL Normal, HF unlikely >= 900 pg/mL High probability of HF >75 years: <= 300 pg/mL Normal, HF unlikely >= 1800 pg/mL High probability of HF . For patients with coronary heart disease, the optimal risk category cut points for incident HF or CVD death (<253 pg/mL men, <372 pg/mL women) are based on Omland T, et al. J Am Coll Cardiol. 8099:83:382-50. . For patients with existing HF, the optimal risk category cut point for HF progression (<300 pg/mL) is based on Otilio Saber, et al. Clin Biochem. 2010;43:1405-10. . For additional information, please refer to http://education.questdiagnostics.com/faq/FAQ202 (This link is being provided for informational/ educational purposes only.) .     No results found.     All questions at time of visit were answered - patient instructed to contact office with any additional concerns or updates. ER/RTC precautions were reviewed with the patient as applicable.   Please note: manual typing as well as voice recognition software may have been used to produce this document - typos may escape review. Please contact Dr. Lyn Hollingshead for any needed clarifications.   Total encounter time on date of service, 10/12/20, was 20 minutes spent addressing problems/issues as noted above in Assessment & Plan, including time spent in discussion with patient regarding the HPI, ROS, confirming history, reviewing Assessment & Plan, as well as time spent on coordination of care, record review.

## 2020-11-06 DIAGNOSIS — I255 Ischemic cardiomyopathy: Secondary | ICD-10-CM | POA: Diagnosis not present

## 2020-11-06 DIAGNOSIS — I350 Nonrheumatic aortic (valve) stenosis: Secondary | ICD-10-CM | POA: Diagnosis not present

## 2020-11-06 DIAGNOSIS — I482 Chronic atrial fibrillation, unspecified: Secondary | ICD-10-CM | POA: Diagnosis not present

## 2020-11-08 ENCOUNTER — Encounter: Payer: Self-pay | Admitting: Physician Assistant

## 2020-11-08 ENCOUNTER — Other Ambulatory Visit: Payer: Self-pay

## 2020-11-08 ENCOUNTER — Ambulatory Visit (INDEPENDENT_AMBULATORY_CARE_PROVIDER_SITE_OTHER): Payer: Medicare Other | Admitting: Physician Assistant

## 2020-11-08 VITALS — Ht 67.0 in | Wt 142.0 lb

## 2020-11-08 DIAGNOSIS — W19XXXD Unspecified fall, subsequent encounter: Secondary | ICD-10-CM | POA: Diagnosis not present

## 2020-11-08 DIAGNOSIS — Z9181 History of falling: Secondary | ICD-10-CM | POA: Insufficient documentation

## 2020-11-08 DIAGNOSIS — R55 Syncope and collapse: Secondary | ICD-10-CM | POA: Diagnosis not present

## 2020-11-08 DIAGNOSIS — I5020 Unspecified systolic (congestive) heart failure: Secondary | ICD-10-CM | POA: Diagnosis not present

## 2020-11-08 DIAGNOSIS — R42 Dizziness and giddiness: Secondary | ICD-10-CM | POA: Insufficient documentation

## 2020-11-08 DIAGNOSIS — I4891 Unspecified atrial fibrillation: Secondary | ICD-10-CM | POA: Diagnosis not present

## 2020-11-08 DIAGNOSIS — I350 Nonrheumatic aortic (valve) stenosis: Secondary | ICD-10-CM | POA: Diagnosis not present

## 2020-11-08 DIAGNOSIS — W19XXXA Unspecified fall, initial encounter: Secondary | ICD-10-CM | POA: Insufficient documentation

## 2020-11-08 DIAGNOSIS — I10 Essential (primary) hypertension: Secondary | ICD-10-CM | POA: Diagnosis not present

## 2020-11-08 NOTE — Patient Instructions (Addendum)
This is cardiology last notes:  1. Add Aldactone 25 mg daily 2. Add Farxiga 10 mg daily 3. Continue Toprol XL 200 mg daily 4. Continue Lasix 20 mg daily  USE CANE.  Consider therapy for VERTIGO.

## 2020-11-08 NOTE — Progress Notes (Signed)
Subjective:    Patient ID: Ryan Bernard, male    DOB: Jun 16, 1931, 87 y.o.   MRN: [id number]  HPI Pt is a 87 yo male with ischemic cardiomyopathy with heart failure, chronic atrial fibrillation, aortic stenosis who presents to the clinic with increased dizziness, hypotension and falls. He sees Dr. Boneta Lucks for cardiology needs. He was last seen 7/29. His meds were changed. See below.   His first fall was 3 weeks ago while outside he went to bend over and hit the back of his head. He has felt more dizzy over past 3 weeks. He was leaning forward to put a record player on and when leaned forward and when he did he pulled the record player on his legs and fell backward.    Plan 1. Add Aldactone 25 mg daily 2. Add Farxiga 10 mg daily 3. Continue Toprol XL 200 mg daily 4. Continue Lasix 20 mg daily 5. Return in 4-6 weeks 6. Lab work in 4 weeks  In addition to meds changed by cardiology the [place] sent 40mg  of lasix instead of 20mg  of lasix and he did not notice. He started cutting in half and started feeling better. He has not fallen since. He continues to feel "dizzy" when he stands.   . Active Ambulatory Problems    Diagnosis Date Noted   Essential hypertension, benign 03/13/2007   Coronary atherosclerosis 03/13/2007   PULMONARY HYPERTENSION 04/07/2007   SOLAR KERATOSIS 03/13/2007   MICROALBUMINURIA 08/07/2007   BENIGN PROSTATIC HYPERTROPHY, HX OF 03/13/2007   Hyperlipidemia LDL goal < 70 05/05/2012   S/P CABG x 5 05/05/2012   Chronic kidney disease, stage 3 (HCC) 05/06/2012   Postherpetic neuralgia 06/10/2013   Prediabetes 09/19/2014   History of pneumonia 07/17/2017   ACC/AHA stage C heart failure with reduced ejection fraction (HCC) 09/08/2017   Subclinical hypothyroidism 03/06/2018   Atrial fibrillation (HCC) 07/02/2018   Shortness of breath 09/27/2020   Dizziness 11/08/2020   Fall 11/08/2020   Near syncope 11/08/2020   Moderate aortic stenosis 11/08/2020   Resolved  Ambulatory Problems    Diagnosis Date Noted   No Resolved Ambulatory Problems   Past Medical History:  Diagnosis Date   Anemia    BPH (benign prostatic hyperplasia)    CAD (coronary artery disease) 1995   Diabetes mellitus 2004   Heart disease    Hyperlipidemia    Hypertension    SBE (subacute bacterial endocarditis) prophylaxis candidate       Review of Systems See HPI.     Objective:   Physical Exam Vitals reviewed.  Constitutional:      Appearance: Normal appearance.  HENT:     Head: Normocephalic.  Cardiovascular:     Rate and Rhythm: Rhythm irregular.     Heart sounds: Murmur heard.  Musculoskeletal:     Cervical back: Neck supple.     Right lower leg: No edema.     Left lower leg: No edema.  Neurological:     General: No focal deficit present.     Mental Status: He is alert and oriented to person, place, and time.  Psychiatric:        Mood and Affect: Mood normal.          Assessment & Plan:  .1996[name] was seen today for dizziness.  Diagnoses and all orders for this visit:  Fall, subsequent encounter  Dizziness  ACC/AHA stage C heart failure with reduced ejection fraction (HCC)  Essential hypertension, benign  Near syncope  Atrial  fibrillation, unspecified type (HCC)   .Marland KitchenOrthostatic VS for the past 24 hrs (Last 3 readings):  BP- Lying Pulse- Lying BP- Sitting Pulse- Sitting BP- Standing at 0 minutes Pulse- Standing at 0 minutes  11/08/20 0725 152/86 76 141/83 71 141/77 89   No major orthostatic changes.  Likely the prescription error with 40mg  of lasix at pharmacy instead of 20mg  was the reason for some of the dizziness in combination with addition of farxiga and spironolactone from cardiology on 7/29.  Go back to 20mg  of lasix as needed. Continue to monitor BP.  Keep follow up with cardiology.  Consider therapy for some Vertigo and balance. Pt declined today(he has done in past). Continue to watch weight for any changes.  USE CANE.   Discussed increased risk of bleeding due to being on Eliqus.   PCP is leaving in September. Discussed with patient to establish care with provider in office in next 4 weeks. Dr. 8/29 is on his medicare card.   Spent 30 minutes with patient reviewing charts, medications, making plan, education.

## 2020-11-27 DIAGNOSIS — I482 Chronic atrial fibrillation, unspecified: Secondary | ICD-10-CM | POA: Diagnosis not present

## 2020-11-27 DIAGNOSIS — I255 Ischemic cardiomyopathy: Secondary | ICD-10-CM | POA: Diagnosis not present

## 2020-11-27 DIAGNOSIS — I350 Nonrheumatic aortic (valve) stenosis: Secondary | ICD-10-CM | POA: Diagnosis not present

## 2020-12-11 ENCOUNTER — Other Ambulatory Visit: Payer: Self-pay

## 2020-12-11 ENCOUNTER — Encounter: Payer: Self-pay | Admitting: Family Medicine

## 2020-12-11 ENCOUNTER — Ambulatory Visit (INDEPENDENT_AMBULATORY_CARE_PROVIDER_SITE_OTHER): Payer: Medicare Other | Admitting: Family Medicine

## 2020-12-11 VITALS — BP 134/84 | HR 81 | Temp 98.1°F | Ht 67.0 in | Wt 145.0 lb

## 2020-12-11 DIAGNOSIS — Z23 Encounter for immunization: Secondary | ICD-10-CM

## 2020-12-11 DIAGNOSIS — R7303 Prediabetes: Secondary | ICD-10-CM

## 2020-12-11 DIAGNOSIS — E785 Hyperlipidemia, unspecified: Secondary | ICD-10-CM | POA: Diagnosis not present

## 2020-12-11 DIAGNOSIS — I4891 Unspecified atrial fibrillation: Secondary | ICD-10-CM

## 2020-12-11 DIAGNOSIS — Z7901 Long term (current) use of anticoagulants: Secondary | ICD-10-CM | POA: Diagnosis not present

## 2020-12-11 DIAGNOSIS — I10 Essential (primary) hypertension: Secondary | ICD-10-CM

## 2020-12-11 DIAGNOSIS — Z951 Presence of aortocoronary bypass graft: Secondary | ICD-10-CM

## 2020-12-11 DIAGNOSIS — E119 Type 2 diabetes mellitus without complications: Secondary | ICD-10-CM | POA: Diagnosis not present

## 2020-12-11 DIAGNOSIS — R55 Syncope and collapse: Secondary | ICD-10-CM | POA: Diagnosis not present

## 2020-12-11 NOTE — Progress Notes (Signed)
Ryan Bernard - 87 y.o. male MRN [id number]  Date of birth: [birth year]  Subjective Chief Complaint  Patient presents with   Follow-up    HPI Ryan Bernard is an 87 year old male here today for follow-up visit.  He is a former patient of Dr. Lyn Hollingshead who is transferring care.  He has a history of hypertension, atrial fibrillation, CAD, type 2 diabetes with CKD and hyperlipidemia.  He was seen about a month ago for dizziness.  Noted to have received increased dose of Lasix from the [place] pharmacy since reducing his back to 20 mg he has had improvement of his dizziness.  He has not had any further dizziness since this time.  He has had follow-up with cardiology with no additional medication changes or work-up.  He denies chest pain, shortness of breath, palpitations, headache or vision changes.  His diabetes has been well controlled with Jardiance.  No side effects noted with this.  He is taking pravastatin for associated hyperlipidemia.  A. fib managed by cardiology.  Rate controlled with metoprolol and anticoagulated with Eliquis.  Doing well with these.  ROS:  A comprehensive ROS was completed and negative except as noted per HPI  No Known Allergies  Past Medical History:  Diagnosis Date   Anemia    BPH (benign prostatic hyperplasia)    CAD (coronary artery disease) 1995   w/ angioplasty   Diabetes mellitus 2004   type 2   Heart disease    Mild valvular w/ pulm HTN   Hyperlipidemia    Hypertension    SBE (subacute bacterial endocarditis) prophylaxis candidate     Past Surgical History:  Procedure Laterality Date   ANGIOPLASTY  1995   [facility]   BACK SURGERY  1964   CORONARY ARTERY BYPASS GRAFT  10/27/2009   5 vessel, Dr. Raoul Pitch   HERNIA REPAIR     x 2     Social History   Socioeconomic History   Marital status: Widowed    Spouse name: Not on file   Number of children: 2   Years of education: 93   Highest education level: 12th grade  Occupational History    Occupation: retired    Comment: Insurance claims handler  Tobacco Use   Smoking status: Never   Smokeless tobacco: Never  Vaping Use   Vaping Use: Never used  Substance and Sexual Activity   Alcohol use: No   Drug use: No   Sexual activity: Not Currently  Other Topics Concern   Not on file  Social History Narrative   Sits around house and watches TV. Drinks 1 cup of coffee in the morning. Walks everyday for 1 hour   Social Determinants of Health   Financial Resource Strain: Not on file  Food Insecurity: Not on file  Transportation Needs: Not on file  Physical Activity: Not on file  Stress: Not on file  Social Connections: Not on file    Family History  Problem Relation Age of Onset   Heart disease Mother    Hypertension Mother    Alcohol abuse Father    Depression Daughter    Depression Son     Health Maintenance  Topic Date Due   OPHTHALMOLOGY EXAM  04/25/2018   HEMOGLOBIN A1C  02/24/2020   FOOT EXAM  12/11/2021   TETANUS/TDAP  10/09/2025   INFLUENZA VACCINE  Completed   COVID-19 Vaccine  Completed   Zoster Vaccines- Shingrix  Completed   HPV VACCINES  Aged Out     -----------------------------------------------------------------------------------------------------------------------------------------------------------------------------------------------------------------  Physical Exam BP 134/84 (BP Location: Left Arm, Patient Position: Sitting, Cuff Size: Small)   Pulse 81   Temp 98.1 F (36.7 C)   Ht 5\' 7"  (1.702 m)   Wt 145 lb (65.8 kg)   SpO2 93%   BMI 22.71 kg/m   Physical Exam Constitutional:      Appearance: Normal appearance.  Eyes:     General: No scleral icterus. Cardiovascular:     Rate and Rhythm: Normal rate and regular rhythm.  Pulmonary:     Effort: Pulmonary effort is normal.     Breath sounds: Normal breath sounds.  Musculoskeletal:     Cervical back: Neck supple.  Neurological:     General: No focal deficit present.     Mental Status: He  is alert.  Psychiatric:        Mood and Affect: Mood normal.        Behavior: Behavior normal.    ------------------------------------------------------------------------------------------------------------------------------------------------------------------------------------------------------------------- Assessment and Plan  Near syncope This has improved with reduction in Lasix to 20 mg.  He will continue current medications.  Essential hypertension, benign Blood pressure well controlled at this time.  He will continue current medications for management of hypertension.  Atrial fibrillation (HCC) Management per cardiology.  Rate controlled with metoprolol and tolerating anticoagulation with Eliquis  Controlled type 2 diabetes mellitus without complication (HCC) Diabetes is controlled with diet.  Obtain A1c today.   No orders of the defined types were placed in this encounter.   Return in about 6 months (around 06/11/2021) for HTN/HLD.    This visit occurred during the SARS-CoV-2 public health emergency.  Safety protocols were in place, including screening questions prior to the visit, additional usage of staff PPE, and extensive cleaning of exam room while observing appropriate contact time as indicated for disinfecting solutions.

## 2020-12-11 NOTE — Assessment & Plan Note (Signed)
This has improved with reduction in Lasix to 20 mg.  He will continue current medications.

## 2020-12-11 NOTE — Patient Instructions (Signed)
Nice to meet you today! Continue current medications.  See me again in 6 months.  

## 2020-12-11 NOTE — Assessment & Plan Note (Signed)
Diabetes is controlled with diet.  Obtain A1c today.

## 2020-12-11 NOTE — Assessment & Plan Note (Signed)
Management per cardiology.  Rate controlled with metoprolol and tolerating anticoagulation with Eliquis

## 2020-12-11 NOTE — Assessment & Plan Note (Signed)
Blood pressure well controlled at this time.  He will continue current medications for management of hypertension. 

## 2020-12-12 LAB — BASIC METABOLIC PANEL
BUN: 19 mg/dL (ref 7–25)
CO2: 33 mmol/L — ABNORMAL HIGH (ref 20–32)
Calcium: 9.4 mg/dL (ref 8.6–10.3)
Chloride: 102 mmol/L (ref 98–110)
Creat: 1.14 mg/dL (ref 0.70–1.22)
Glucose, Bld: 113 mg/dL — ABNORMAL HIGH (ref 65–99)
Potassium: 4.2 mmol/L (ref 3.5–5.3)
Sodium: 142 mmol/L (ref 135–146)

## 2020-12-12 LAB — LIPID PANEL W/REFLEX DIRECT LDL
Cholesterol: 135 mg/dL (ref <200)
HDL: 48 mg/dL (ref >=40)
LDL Cholesterol (Calc): 73 mg/dL (calc)
Non-HDL Cholesterol (Calc): 87 mg/dL (calc) (ref <130)
Total CHOL/HDL Ratio: 2.8 (calc) (ref <5.0)
Triglycerides: 64 mg/dL (ref <150)

## 2020-12-12 LAB — CBC WITH DIFFERENTIAL/PLATELET
Absolute Monocytes: 510 cells/uL (ref 200–950)
Basophils Absolute: 32 cells/uL (ref 0–200)
Basophils Relative: 0.5 %
Eosinophils Absolute: 82 cells/uL (ref 15–500)
Eosinophils Relative: 1.3 %
HCT: 42.4 % (ref 38.5–50.0)
Hemoglobin: 14.3 g/dL (ref 13.2–17.1)
Lymphs Abs: 1077 cells/uL (ref 850–3900)
MCH: 31.8 pg (ref 27.0–33.0)
MCHC: 33.7 g/dL (ref 32.0–36.0)
MCV: 94.4 fL (ref 80.0–100.0)
MPV: 11.3 fL (ref 7.5–12.5)
Monocytes Relative: 8.1 %
Neutro Abs: 4599 cells/uL (ref 1500–7800)
Neutrophils Relative %: 73 %
Platelets: 142 10*3/uL (ref 140–400)
RBC: 4.49 10*6/uL (ref 4.20–5.80)
RDW: 12.8 % (ref 11.0–15.0)
Total Lymphocyte: 17.1 %
WBC: 6.3 10*3/uL (ref 3.8–10.8)

## 2020-12-12 LAB — HEMOGLOBIN A1C
Hgb A1c MFr Bld: 6.2 % of total Hgb — ABNORMAL HIGH (ref <5.7)
Mean Plasma Glucose: 131 mg/dL
eAG (mmol/L): 7.3 mmol/L

## 2020-12-18 ENCOUNTER — Encounter: Payer: Self-pay | Admitting: Family Medicine

## 2021-01-10 ENCOUNTER — Ambulatory Visit (INDEPENDENT_AMBULATORY_CARE_PROVIDER_SITE_OTHER): Payer: Medicare Other | Admitting: Family Medicine

## 2021-01-10 ENCOUNTER — Encounter: Payer: Self-pay | Admitting: Family Medicine

## 2021-01-10 DIAGNOSIS — I739 Peripheral vascular disease, unspecified: Secondary | ICD-10-CM | POA: Diagnosis not present

## 2021-01-10 NOTE — Patient Instructions (Addendum)
Continue current medications for now.  Continue to walk or ride stationary bike for circulation.  Keep appt for April 2023

## 2021-01-11 DIAGNOSIS — I739 Peripheral vascular disease, unspecified: Secondary | ICD-10-CM | POA: Insufficient documentation

## 2021-01-11 NOTE — Assessment & Plan Note (Signed)
He seems to have some mild PAD based on Quantaflo test.  He does have fairly strong pulses bilaterally.  He denies any claudication symptoms.  I think we can just monitor this for now.

## 2021-01-11 NOTE — Progress Notes (Signed)
Ryan Bernard - 87 y.o. male MRN [id number]  Date of birth: Jan 02, 1932  Subjective Chief Complaint  Patient presents with   Circulatory Problem    HPI Ryan Bernard is an 87 year old male here today to discuss results of recent screening he had with nurse from his insurance company.  He has noted to have diminished circulation on Quantaflo home test.  Measurements on right leg were normal however left leg was 0.68.  He has not had any claudication symptoms.  He denies any changes to sensation of his foot.  ROS:  A comprehensive ROS was completed and negative except as noted per HPI  No Known Allergies  Past Medical History:  Diagnosis Date   Anemia    BPH (benign prostatic hyperplasia)    CAD (coronary artery disease) 1995   w/ angioplasty   Diabetes mellitus 2004   type 2   Heart disease    Mild valvular w/ pulm HTN   Hyperlipidemia    Hypertension    SBE (subacute bacterial endocarditis) prophylaxis candidate     Past Surgical History:  Procedure Laterality Date   ANGIOPLASTY  1995   [place] [facility]   [place]   CORONARY ARTERY BYPASS GRAFT  10/27/2009   5 vessel, Dr. Glenford Bayley   HERNIA REPAIR     x 2     Social History   Socioeconomic History   Marital status: Widowed    Spouse name: Not on file   Number of children: 2   Years of education: 83   Highest education level: 12th grade  Occupational History   Occupation: retired    Comment: Writer  Tobacco Use   Smoking status: Never   Smokeless tobacco: Never  Vaping Use   Vaping Use: Never used  Substance and Sexual Activity   Alcohol use: No   Drug use: No   Sexual activity: Not Currently  Other Topics Concern   Not on file  Social History Narrative   Sits around house and watches TV. Drinks 1 cup of coffee in the morning. Walks everyday for 1 hour   Social Determinants of Health   Financial Resource Strain: Not on file  Food Insecurity: Not on file  Transportation Needs: Not on file   Physical Activity: Not on file  Stress: Not on file  Social Connections: Not on file    Family History  Problem Relation Age of Onset   Heart disease Mother    Hypertension Mother    Alcohol abuse Father    Depression Daughter    Depression Son     Health Maintenance  Topic Date Due   HEMOGLOBIN A1C  06/11/2021   OPHTHALMOLOGY EXAM  07/04/2021   FOOT EXAM  12/11/2021   TETANUS/TDAP  10/09/2025   Pneumonia Vaccine 62+ Years old  Completed   INFLUENZA VACCINE  Completed   COVID-19 Vaccine  Completed   Zoster Vaccines- Shingrix  Completed   HPV VACCINES  Aged Out     ----------------------------------------------------------------------------------------------------------------------------------------------------------------------------------------------------------------- Physical Exam BP 107/63 (BP Location: Left Arm, Patient Position: Sitting, Cuff Size: Small)   Pulse 68   Temp 98.1 F (36.7 C)   Ht 5\' 7"  (1.702 m)   Wt 144 lb (65.3 kg)   SpO2 (!) 88%   BMI 22.55 kg/m   Physical Exam Constitutional:      Appearance: Normal appearance.  Cardiovascular:     Rate and Rhythm: Normal rate and regular rhythm.     Comments: He has 1+ posterior tibial  and dorsalis pedis pulses.  Extremities appear well-perfused. Pulmonary:     Effort: Pulmonary effort is normal.     Breath sounds: Normal breath sounds.  Musculoskeletal:     Cervical back: Neck supple.  Neurological:     Mental Status: He is alert.    ------------------------------------------------------------------------------------------------------------------------------------------------------------------------------------------------------------------- Assessment and Plan  PAD (peripheral artery disease) (HCC) He seems to have some mild PAD based on Quantaflo test.  He does have fairly strong pulses bilaterally.  He denies any claudication symptoms.  I think we can just monitor this for now.   No orders  of the defined types were placed in this encounter.   No follow-ups on file.    This visit occurred during the SARS-CoV-2 public health emergency.  Safety protocols were in place, including screening questions prior to the visit, additional usage of staff PPE, and extensive cleaning of exam room while observing appropriate contact time as indicated for disinfecting solutions.

## 2021-02-22 ENCOUNTER — Telehealth: Payer: Self-pay

## 2021-02-22 MED ORDER — OSELTAMIVIR PHOSPHATE 75 MG PO CAPS: 75.0000 mg | ORAL_CAPSULE | Freq: Every day | ORAL | 0 refills | Status: DC

## 2021-02-22 NOTE — Telephone Encounter (Signed)
Prophylactic dose sent to [facility] pharmacy.

## 2021-02-22 NOTE — Telephone Encounter (Signed)
Pt's daughter was exposed to the flu by the pt's granddaughter. His daughter is having to stay with him right now so she is wanting to know if a Rx for Tamiflu can be called in for him as a preventative measure.

## 2021-02-23 ENCOUNTER — Other Ambulatory Visit: Payer: Self-pay | Admitting: *Deleted

## 2021-02-23 MED ORDER — OSELTAMIVIR PHOSPHATE 75 MG PO CAPS: 75.0000 mg | ORAL_CAPSULE | Freq: Every day | ORAL | 0 refills | Status: DC

## 2021-02-23 NOTE — Telephone Encounter (Signed)
Pt's daughter informed of RX.  Daughter expressed understanding.  Tiajuana Amass, CMA

## 2021-03-30 ENCOUNTER — Ambulatory Visit: Payer: Medicare PPO | Admitting: Family Medicine

## 2021-03-30 ENCOUNTER — Encounter: Payer: Self-pay | Admitting: Family Medicine

## 2021-03-30 ENCOUNTER — Other Ambulatory Visit: Payer: Self-pay

## 2021-03-30 VITALS — BP 137/57 | HR 85 | Resp 16 | Ht 67.0 in | Wt 138.0 lb

## 2021-03-30 DIAGNOSIS — J069 Acute upper respiratory infection, unspecified: Secondary | ICD-10-CM | POA: Diagnosis not present

## 2021-03-30 DIAGNOSIS — J029 Acute pharyngitis, unspecified: Secondary | ICD-10-CM | POA: Diagnosis not present

## 2021-03-30 LAB — POCT INFLUENZA A/B
Influenza A, POC: NEGATIVE
Influenza B, POC: NEGATIVE

## 2021-03-30 NOTE — Progress Notes (Signed)
Acute Office Visit  Subjective:    Patient ID: Ryan Bernard, male    DOB: January 09, 1932, 88 y.o.   MRN: [id number]  Chief Complaint  Patient presents with   Sore Throat    Productive cough, facial pain. 1 day. Did not take a covid test.     HPI Patient is in today for sinus sxs. ST, post nasal drip, facial pressure. He feels ike he has a lot of congestion but it won't "come up".  No nasal congestion.  He feels like the cough is mostly in his upper chest area no fevers chills or sweats.  He was exposed to someone on Wednesday, 2 days ago who just found out they had the flu today.  Past Medical History:  Diagnosis Date   Anemia    BPH (benign prostatic hyperplasia)    CAD (coronary artery disease) 1995   w/ angioplasty   Diabetes mellitus 2004   type 2   Heart disease    Mild valvular w/ pulm HTN   Hyperlipidemia    Hypertension    SBE (subacute bacterial endocarditis) prophylaxis candidate     Past Surgical History:  Procedure Laterality Date   ANGIOPLASTY  1995   [facility]   [place]   CORONARY ARTERY BYPASS GRAFT  10/27/2009   5 vessel, Dr. Glenford Bayley   HERNIA REPAIR     x 2     Family History  Problem Relation Age of Onset   Heart disease Mother    Hypertension Mother    Alcohol abuse Father    Depression Daughter    Depression Son     Social History   Socioeconomic History   Marital status: Widowed    Spouse name: Not on file   Number of children: 2   Years of education: 26   Highest education level: 12th grade  Occupational History   Occupation: retired    Comment: Writer  Tobacco Use   Smoking status: Never   Smokeless tobacco: Never  Vaping Use   Vaping Use: Never used  Substance and Sexual Activity   Alcohol use: No   Drug use: No   Sexual activity: Not Currently  Other Topics Concern   Not on file  Social History Narrative   Sits around house and watches TV. Drinks 1 cup of coffee in the morning. Walks everyday for  1 hour   Social Determinants of Radio broadcast assistant Strain: Not on file  Food Insecurity: Not on file  Transportation Needs: Not on file  Physical Activity: Not on file  Stress: Not on file  Social Connections: Not on file  Intimate Partner Violence: Not on file    Outpatient Medications Prior to Visit  Medication Sig Dispense Refill   albuterol (PROVENTIL HFA;VENTOLIN HFA) 108 (90 Base) MCG/ACT inhaler Inhale 2 puffs into the lungs every 6 (six) hours as needed for wheezing. 2 Inhaler 11   apixaban (ELIQUIS) 5 MG TABS tablet Take 5 mg by mouth 2 (two) times daily.     Cyanocobalamin ([facility] B12 GUMMIES PO) Take 1 each by mouth daily.     empagliflozin (JARDIANCE) 10 MG TABS tablet Take 1 tablet by mouth every morning.     furosemide (LASIX) 20 MG tablet Take 1 tablet (20 mg total) by mouth daily.     metoprolol succinate (TOPROL-XL) 100 MG 24 hr tablet Take 100 mg by mouth daily. Take with or immediately following a meal.  oseltamivir (TAMIFLU) 75 MG capsule Take 1 capsule (75 mg total) by mouth daily. 10 capsule 0   pravastatin (PRAVACHOL) 80 MG tablet Take 1 tablet (80 mg total) by mouth at bedtime. 90 tablet 3   spironolactone (ALDACTONE) 25 MG tablet Take 1 tablet by mouth daily.     terazosin (HYTRIN) 10 MG capsule Take 10 mg by mouth daily.       No facility-administered medications prior to visit.    No Known Allergies  Review of Systems     Objective:    Physical Exam Constitutional:      Appearance: He is well-developed.  HENT:     Head: Normocephalic and atraumatic.     Right Ear: Tympanic membrane, ear canal and external ear normal.     Left Ear: Tympanic membrane, ear canal and external ear normal.     Nose: Nose normal.     Mouth/Throat:     Mouth: Mucous membranes are moist.     Pharynx: No oropharyngeal exudate.  Eyes:     Conjunctiva/sclera: Conjunctivae normal.     Pupils: Pupils are equal, round, and reactive to light.  Neck:     Thyroid:  No thyromegaly.  Cardiovascular:     Rate and Rhythm: Normal rate. Rhythm irregular.     Heart sounds: Normal heart sounds.     Comments: 3-6 systolic ejection murmur. Pulmonary:     Effort: Pulmonary effort is normal.     Breath sounds: Normal breath sounds.     Comments: Slight inspiratory wheeze in the right upper anterior mid chest. Musculoskeletal:     Cervical back: Neck supple.  Lymphadenopathy:     Cervical: No cervical adenopathy.  Skin:    General: Skin is warm and dry.  Neurological:     Mental Status: He is alert and oriented to person, place, and time.    BP (!) 137/57 (BP Location: Left Arm)    Pulse 85    Resp 16    Ht 5\' 7"  (1.702 m)    Wt 138 lb (62.6 kg)    SpO2 94%    BMI 21.61 kg/m  Wt Readings from Last 3 Encounters:  03/30/21 138 lb (62.6 kg)  01/10/21 144 lb (65.3 kg)  12/11/20 145 lb (65.8 kg)    There are no preventive care reminders to display for this patient.  There are no preventive care reminders to display for this patient.   Lab Results  Component Value Date   TSH 10.66 (H) 08/25/2019   Lab Results  Component Value Date   WBC 6.3 12/11/2020   HGB 14.3 12/11/2020   HCT 42.4 12/11/2020   MCV 94.4 12/11/2020   PLT 142 12/11/2020   Lab Results  Component Value Date   NA 142 12/11/2020   K 4.2 12/11/2020   CO2 33 (H) 12/11/2020   GLUCOSE 113 (H) 12/11/2020   BUN 19 12/11/2020   CREATININE 1.14 12/11/2020   BILITOT 1.0 09/27/2020   ALKPHOS 69 11/02/2015   AST 17 09/27/2020   ALT 11 09/27/2020   PROT 7.4 09/27/2020   ALBUMIN 4.0 11/02/2015   CALCIUM 9.4 12/11/2020   ANIONGAP 29 05/27/2007   Lab Results  Component Value Date   CHOL 135 12/11/2020   Lab Results  Component Value Date   HDL 48 12/11/2020   Lab Results  Component Value Date   LDLCALC 73 12/11/2020   Lab Results  Component Value Date   TRIG 64 12/11/2020   Lab Results  Component Value Date   CHOLHDL 2.8 12/11/2020   Lab Results  Component Value Date    HGBA1C 6.2 (H) 12/11/2020       Assessment & Plan:   Problem List Items Addressed This Visit   None Visit Diagnoses     Sore throat    -  Primary   Relevant Orders   POCT Influenza A/B (Completed)   Novel Coronavirus, NAA ([facility])   Viral upper respiratory tract infection          Respiratory infection-we did do a flu swab since he did have a positive exposure 2 days ago.  It was negative.  He actually still has some old Tamiflu at home where he was given the prescription for prophylaxis but says he only took half of it.  COVID swab pending.  At this point symptoms seem most consistent with upper respiratory infection lungs are clear on exam.  He can give [place] a call if the weekend to let us know if he is not improving, getting worse, or develop new symptoms.  No orders of the defined types were placed in this encounter.    Beatrice Lecher, MD

## 2021-03-31 LAB — NOVEL CORONAVIRUS, NAA: SARS-CoV-2, NAA: NOT DETECTED

## 2021-03-31 LAB — SARS-COV-2, NAA 2 DAY TAT

## 2021-04-02 NOTE — Progress Notes (Signed)
Hi Ryan Bernard, just wanted to let you know that you were negative for COVID.  Please let us know how you are feeling if you are still not feeling well please call us back.

## 2021-04-23 ENCOUNTER — Ambulatory Visit (INDEPENDENT_AMBULATORY_CARE_PROVIDER_SITE_OTHER): Payer: Medicare PPO | Admitting: Family Medicine

## 2021-04-23 VITALS — BP 119/82 | HR 70

## 2021-04-23 DIAGNOSIS — Z0000 Encounter for general adult medical examination without abnormal findings: Secondary | ICD-10-CM | POA: Diagnosis not present

## 2021-04-23 NOTE — Patient Instructions (Signed)
[facility] Maintenance Summary and Written Plan of Care  Mr. Ryan Bernard ,  Thank you for allowing me to perform your Medicare Annual Wellness Visit and for your ongoing commitment to your health.   Health Maintenance & Immunization History Health Maintenance  Topic Date Due   HEMOGLOBIN A1C  06/11/2021   OPHTHALMOLOGY EXAM  07/04/2021   FOOT EXAM  12/11/2021   TETANUS/TDAP  10/09/2025   Pneumonia Vaccine 14+ Years old  Completed   INFLUENZA VACCINE  Completed   COVID-19 Vaccine  Completed   Zoster Vaccines- Shingrix  Completed   HPV VACCINES  Aged Out   Immunization History  Administered Date(s) Administered   Fluad Quad(high Dose 65+) 11/02/2018, 12/21/2019, 12/11/2020   Influenza Split 12/24/2011, 12/09/2012, 12/09/2013   Influenza Whole 01/09/2009, 12/20/2009, 12/28/2010   Influenza, High Dose Seasonal PF 12/09/2009, 01/10/2011, 12/10/2011, 11/02/2015, 10/09/2016, 10/30/2016, 12/01/2017   Influenza, Seasonal, Injecte, Preservative Fre 11/06/2012, 12/14/2013, 01/10/2015   Influenza,inj,Quad PF,6+ Mos 11/06/2012, 12/14/2013, 01/10/2015   Influenza-Unspecified 12/09/2000, 12/09/2001, 12/10/2002, 01/16/2004, 01/09/2005, 01/09/2006, 01/10/2007, 11/24/2007, 01/09/2009, 11/02/2015, 10/30/2016, 12/01/2017, 12/09/2018, 12/21/2019   Moderna Sars-Covid-2 Vaccination 04/23/2019, 05/21/2019, 09/20/2019, 03/13/2020   PFIZER(Purple Top)SARS-COV-2 Vaccination 05/04/2019, 05/25/2019   Pneumococcal Conjugate-13 03/16/2014   Pneumococcal Polysaccharide-23 08/20/2011   Pneumococcal-Unspecified 12/09/2000   Td 12/10/2002   Tdap 10/10/2015   Tetanus 09/08/2009   Zoster Recombinat (Shingrix) 06/23/2020, 08/22/2020    These are the patient goals that we discussed:  Goals Addressed               This Visit's Progress     Patient Stated (pt-stated)        04/23/2021 AWV Goal: Exercise for General Health  Patient will verbalize understanding of the benefits of  increased physical activity: Exercising regularly is important. It will improve your overall fitness, flexibility, and endurance. Regular exercise also will improve your overall health. It can help you control your weight, reduce stress, and improve your bone density. Over the next year, patient will increase physical activity as tolerated with a goal of at least 150 minutes of moderate physical activity per week.  You can tell that you are exercising at a moderate intensity if your heart starts beating faster and you start breathing faster but can still hold a conversation. Moderate-intensity exercise ideas include: Walking 1 mile (1.6 km) in about 15 minutes Biking Hiking Golfing Dancing Water aerobics Patient will verbalize understanding of everyday activities that increase physical activity by providing examples like the following: Yard work, such as: Sales promotion account executive Gardening Washing windows or floors Patient will be able to explain general safety guidelines for exercising:  Before you start a new exercise program, talk with your health care provider. Do not exercise so much that you hurt yourself, feel dizzy, or get very short of breath. Wear comfortable clothes and wear shoes with good support. Drink plenty of water while you exercise to prevent dehydration or heat stroke. Work out until your breathing and your heartbeat get faster.          This is a list of Health Maintenance Items that are overdue or due now: There are no preventive care reminders to display for this patient.   Orders/Referrals Placed Today: No orders of the defined types were placed in this encounter.  (Contact our referral department at [phone] if you have not spoken with someone about your referral appointment within the next 5 days)  Follow-up Plan Follow-up with Luetta Nutting, DO as planned Medicare  wellness visit in one year. AVS printed and mailed to the patient.     Health Maintenance, Male Adopting a healthy lifestyle and getting preventive care are important in promoting health and wellness. Ask your health care provider about: The right schedule for you to have regular tests and exams. Things you can do on your own to prevent diseases and keep yourself healthy. What should I know about diet, weight, and exercise? Eat a healthy diet  Eat a diet that includes plenty of vegetables, fruits, low-fat dairy products, and lean protein. Do not eat a lot of foods that are high in solid fats, added sugars, or sodium. Maintain a healthy weight Body mass index (BMI) is a measurement that can be used to identify possible weight problems. It estimates body fat based on height and weight. Your health care provider can help determine your BMI and help you achieve or maintain a healthy weight. Get regular exercise Get regular exercise. This is one of the most important things you can do for your health. Most adults should: Exercise for at least 150 minutes each week. The exercise should increase your heart rate and make you sweat (moderate-intensity exercise). Do strengthening exercises at least twice a week. This is in addition to the moderate-intensity exercise. Spend less time sitting. Even light physical activity can be beneficial. Watch cholesterol and blood lipids Have your blood tested for lipids and cholesterol at 86 years of age, then have this test every 5 years. You may need to have your cholesterol levels checked more often if: Your lipid or cholesterol levels are high. You are older than 86 years of age. You are at high risk for heart disease. What should I know about cancer screening? Many types of cancers can be detected early and may often be prevented. Depending on your health history and family history, you may need to have cancer screening at various ages. This may include  screening for: Colorectal cancer. Prostate cancer. Skin cancer. Lung cancer. What should I know about heart disease, diabetes, and high blood pressure? Blood pressure and heart disease High blood pressure causes heart disease and increases the risk of stroke. This is more likely to develop in people who have high blood pressure readings or are overweight. Talk with your health care provider about your target blood pressure readings. Have your blood pressure checked: Every 3-5 years if you are 25-62 years of age. Every year if you are 26 years old or older. If you are between the ages of 86 and 32 and are a current or former smoker, ask your health care provider if you should have a one-time screening for abdominal aortic aneurysm (AAA). Diabetes Have regular diabetes screenings. This checks your fasting blood sugar level. Have the screening done: Once every three years after age 25 if you are at a normal weight and have a low risk for diabetes. More often and at a younger age if you are overweight or have a high risk for diabetes. What should I know about preventing infection? Hepatitis B If you have a higher risk for hepatitis B, you should be screened for this virus. Talk with your health care provider to find out if you are at risk for hepatitis B infection. Hepatitis C Blood testing is recommended for: Everyone born from [birth year] through 1963. Anyone with known risk factors for hepatitis C. Sexually transmitted infections (STIs) You should be screened each year for STIs,  including gonorrhea and chlamydia, if: You are sexually active and are younger than 86 years of age. You are older than 86 years of age and your health care provider tells you that you are at risk for this type of infection. Your sexual activity has changed since you were last screened, and you are at increased risk for chlamydia or gonorrhea. Ask your health care provider if you are at risk. Ask your health care  provider about whether you are at high risk for HIV. Your health care provider may recommend a prescription medicine to help prevent HIV infection. If you choose to take medicine to prevent HIV, you should first get tested for HIV. You should then be tested every 3 months for as long as you are taking the medicine. Follow these instructions at home: Alcohol use Do not drink alcohol if your health care provider tells you not to drink. If you drink alcohol: Limit how much you have to 0-2 drinks a day. Know how much alcohol is in your drink. In the [place] one drink equals one 12 oz bottle of beer (355 mL), one 5 oz glass of wine (148 mL), or one 1 oz glass of hard liquor (44 mL). Lifestyle Do not use any products that contain nicotine or tobacco. These products include cigarettes, chewing tobacco, and vaping devices, such as e-cigarettes. If you need help quitting, ask your health care provider. Do not use street drugs. Do not share needles. Ask your health care provider for help if you need support or information about quitting drugs. General instructions Schedule regular health, dental, and eye exams. Stay current with your vaccines. Tell your health care provider if: You often feel depressed. You have ever been abused or do not feel safe at home. Summary Adopting a healthy lifestyle and getting preventive care are important in promoting health and wellness. Follow your health care provider's instructions about healthy diet, exercising, and getting tested or screened for diseases. Follow your health care provider's instructions on monitoring your cholesterol and blood pressure. This information is not intended to replace advice given to you by your health care provider. Make sure you discuss any questions you have with your health care provider. Document Revised: 07/17/2020 Document Reviewed: 07/17/2020 Elsevier Patient Education  [employer].

## 2021-04-23 NOTE — Progress Notes (Addendum)
MEDICARE ANNUAL WELLNESS VISIT  04/23/2021  Telephone Visit Disclaimer This Medicare AWV was conducted by telephone due to national recommendations for restrictions regarding the COVID-19 Pandemic (e.g. social distancing).  I verified, using two identifiers, that I am speaking with Ryan Bernard or their authorized healthcare agent. I discussed the limitations, risks, security, and privacy concerns of performing an evaluation and management service by telephone and the potential availability of an in-person appointment in the future. The patient expressed understanding and agreed to proceed.  Location of Patient: Home Location of Provider (nurse):  In the office.  Subjective:    Ryan Bernard is a 88 y.o. male patient of Everrett Coombe, Ryan Bernard who had a Medicare Annual Wellness Visit today via telephone. Ryan Bernard is Retired and lives with their son. he has 2 children. he reports that he is socially active and does interact with friends/family regularly. he is minimally physically active and enjoys walking in his yard.  Patient Care Team: Everrett Coombe, Ryan Bernard as PCP - General (Family Medicine) Hassan Rowan, MD (Inactive) (Family Medicine) Hassan Rowan, MD (Inactive) (Family Medicine)  Advanced Directives 04/23/2021 01/10/2021 01/19/2020 01/18/2019 01/13/2018 12/14/2013 09/13/2013  Does Patient Have a Medical Advance Directive? Yes No Yes Yes Yes Yes Patient has advance directive, copy not in chart;Patient would not like information  Type of Advance Directive Healthcare Power of [place];Living will - Healthcare Power of [place];Living will Healthcare Power of [place];Living will Healthcare Power of [place];Living will Living will Living will  Does patient want to make changes to medical advance directive? No - Patient declined - No - Patient declined No - Patient declined No - Patient declined - -  Copy of Healthcare Power of Attorney in Chart? No - copy requested - No - copy requested No - copy  requested No - copy requested - -  Would patient like information on creating a medical advance directive? - No - Patient declined - - - - [facility] Utilization Over the Past 12 Months: # of hospitalizations or ER visits: 0 # of surgeries: 0  Review of Systems    Patient reports that his overall health is unchanged compared to last year.  History obtained from chart review and the patient  Patient Reported Readings (BP, Pulse, CBG, Weight, etc) BP: 119/82 Pulse: 70 SPO2: 94  Pain Assessment Pain : No/denies pain     Current Medications & Allergies (verified) Allergies as of 04/23/2021   No Known Allergies      Medication List        Accurate as of April 23, 2021  1:59 PM. If you have any questions, ask your nurse or doctor.          albuterol 108 (90 Base) MCG/ACT inhaler Commonly known as: VENTOLIN HFA Inhale 2 puffs into the lungs every 6 (six) hours as needed for wheezing.   apixaban 5 MG Tabs tablet Commonly known as: ELIQUIS Take 5 mg by mouth 2 (two) times daily.   [employer] B12 GUMMIES PO Take 1 each by mouth daily.   empagliflozin 10 MG Tabs tablet Commonly known as: JARDIANCE Take 1 tablet by mouth every morning.   furosemide 20 MG tablet Commonly known as: LASIX Take 1 tablet (20 mg total) by mouth daily.   metoprolol succinate 100 MG 24 hr tablet Commonly known as: TOPROL-XL Take 100 mg by mouth daily. Take with or immediately following a meal.   oseltamivir 75 MG capsule Commonly known as: Tamiflu Take 1 capsule (75 mg  total) by mouth daily.   pravastatin 80 MG tablet Commonly known as: PRAVACHOL Take 1 tablet (80 mg total) by mouth at bedtime.   spironolactone 25 MG tablet Commonly known as: ALDACTONE Take 1 tablet by mouth daily.   terazosin 10 MG capsule Commonly known as: HYTRIN Take 10 mg by mouth daily.        History (reviewed): Past Medical History:  Diagnosis Date   Anemia    Arthritis    BPH (benign prostatic  hyperplasia)    CAD (coronary artery disease) 03/11/1993   w/ angioplasty   CHF (congestive heart failure) (HCC)    Chronic kidney disease    Diabetes mellitus 03/11/2002   type 2   Heart disease    Mild valvular w/ pulm HTN   Heart murmur    Hyperlipidemia    Hypertension    SBE (subacute bacterial endocarditis) prophylaxis candidate    Past Surgical History:  Procedure Laterality Date   ANGIOPLASTY  03/11/1993   [facility]   BACK SURGERY  03/11/1962   CORONARY ARTERY BYPASS GRAFT  10/27/2009   5 vessel, Dr. Glenford Bayley   EYE SURGERY     HERNIA REPAIR     x 2    Family History  Problem Relation Age of Onset   Heart disease Mother    Hypertension Mother    Alcohol abuse Father    Depression Daughter    Depression Son    Social History   Socioeconomic History   Marital status: Widowed    Spouse name: Not on file   Number of children: 2   Years of education: 74   Highest education level: 12th grade  Occupational History   Occupation: retired    Comment: Writer  Tobacco Use   Smoking status: Never   Smokeless tobacco: Never  Vaping Use   Vaping Use: Never used  Substance and Sexual Activity   Alcohol use: No   Drug use: No   Sexual activity: Not Currently  Other Topics Concern   Not on file  Social History Narrative   Lives with his son. He has two children. Sits around house and watches TV and walks in the yard.   Social Determinants of Health   Financial Resource Strain: Low Risk    Difficulty of Paying Living Expenses: Not hard at all  Food Insecurity: No Food Insecurity   Worried About Charity fundraiser in the Last Year: Never true   [place] in the Last Year: Never true  Transportation Needs: No Transportation Needs   Lack of Transportation (Medical): No   Lack of Transportation (Non-Medical): No  Physical Activity: Inactive   Days of Exercise per Week: 0 days   Minutes of Exercise per Session: 0 min  Stress: No Stress Concern  Present   Feeling of Stress : Not at all  Social Connections: Moderately Isolated   Frequency of Communication with Friends and Family: More than three times a week   Frequency of Social Gatherings with Friends and Family: More than three times a week   Attends Religious Services: 1 to 4 times per year   Active Member of [employer] or Organizations: No   Attends Archivist Meetings: Never   Marital Status: Widowed    Activities of Daily Living In your present state of health, Ryan Bernard you have any difficulty performing the following activities: 04/23/2021 04/22/2021  Hearing? N N  Vision? N N  Difficulty concentrating or making decisions?  N N  Walking or climbing stairs? N N  Dressing or bathing? N N  Doing errands, shopping? Y N  Comment his son usually takes him. -  Preparing Food and eating ? N N  Using the Toilet? N N  In the past six months, have you accidently leaked urine? N N  Ryan Bernard you have problems with loss of bowel control? N N  Managing your Medications? N N  Managing your Finances? N N  Housekeeping or managing your Housekeeping? N N  Some recent data might be hidden    Patient Education/ Literacy How often Ryan Bernard you need to have someone help you when you read instructions, pamphlets, or other written materials from your doctor or pharmacy?: 1 - Never What is the last grade level you completed in school?: 12th grade  Exercise Current Exercise Habits: Home exercise routine, Type of exercise: walking, Time (Minutes): 20, Frequency (Times/Week): 4, Weekly Exercise (Minutes/Week): 80, Intensity: Mild, Exercise limited by: None identified  Diet Patient reports consuming 2 meals a day and 2 snack(s) a day Patient reports that his primary diet is: Regular Patient reports that she does have regular access to food.   Depression Screen PHQ 2/9 Scores 04/23/2021 12/11/2020 01/19/2020 08/25/2019 08/25/2019 01/18/2019 01/13/2018  PHQ - 2 Score 0 0 0 0 0 0 0  PHQ- 9 Score - - - 6 3 - -      Fall Risk Fall Risk  04/23/2021 04/22/2021 12/11/2020 01/19/2020 01/18/2019  Falls in the past year? 1 1 0 1 0  Number falls in past yr: 1 1 0 0 0  Injury with Fall? 1 1 0 1 0  Risk for fall due to : History of fall(s);Medication side effect - No Fall Risks - Impaired balance/gait  Follow up Falls evaluation completed;Education provided;Falls prevention discussed - Falls evaluation completed Falls evaluation completed Falls prevention discussed     Objective:  Ryan Bernard seemed alert and oriented and he participated appropriately during our telephone visit.  Blood Pressure Weight BMI  BP Readings from Last 3 Encounters:  04/23/21 119/82  03/30/21 (!) 137/57  01/10/21 107/63   Wt Readings from Last 3 Encounters:  03/30/21 138 lb (62.6 kg)  01/10/21 144 lb (65.3 kg)  12/11/20 145 lb (65.8 kg)   BMI Readings from Last 1 Encounters:  03/30/21 21.61 kg/m    *Unable to obtain current vital signs, weight, and BMI due to telephone visit type  Hearing/Vision  Ryan Bernard did not seem to have difficulty with hearing/understanding during the telephone conversation Reports that he has had a formal eye exam by an eye care professional within the past year Reports that he has not had a formal hearing evaluation within the past year *Unable to fully assess hearing and vision during telephone visit type  Cognitive Function: 6CIT Screen 04/23/2021 01/19/2020 01/18/2019 01/13/2018  What Year? 0 points 0 points 0 points 0 points  What month? 0 points 0 points 0 points 0 points  What time? 0 points 0 points 0 points 0 points  Count back from 20 0 points 0 points 0 points 0 points  Months in reverse 0 points 0 points - 0 points  Repeat phrase 0 points 10 points 4 points 0 points  Total Score 0 10 - 0   (Normal:0-7, Significant for Dysfunction: >8)  Normal Cognitive Function Screening: Yes   Immunization & Health Maintenance Record Immunization History  Administered Date(s) Administered    Fluad Quad(high Dose 65+) 11/02/2018, 12/21/2019, 12/11/2020  Influenza Split 12/24/2011, 12/09/2012, 12/09/2013   Influenza Whole 01/09/2009, 12/20/2009, 12/28/2010   Influenza, High Dose Seasonal PF 12/09/2009, 01/10/2011, 12/10/2011, 11/02/2015, 10/09/2016, 10/30/2016, 12/01/2017   Influenza, Seasonal, Injecte, Preservative Fre 11/06/2012, 12/14/2013, 01/10/2015   Influenza,inj,Quad PF,6+ Mos 11/06/2012, 12/14/2013, 01/10/2015   Influenza-Unspecified 12/09/2000, 12/09/2001, 12/10/2002, 01/16/2004, 01/09/2005, 01/09/2006, 01/10/2007, 11/24/2007, 01/09/2009, 11/02/2015, 10/30/2016, 12/01/2017, 12/09/2018, 12/21/2019   Moderna Sars-Covid-2 Vaccination 04/23/2019, 05/21/2019, 09/20/2019, 03/13/2020   PFIZER(Purple Top)SARS-COV-2 Vaccination 05/04/2019, 05/25/2019   Pneumococcal Conjugate-13 03/16/2014   Pneumococcal Polysaccharide-23 08/20/2011   Pneumococcal-Unspecified 12/09/2000   Td 12/10/2002   Tdap 10/10/2015   Tetanus 09/08/2009   Zoster Recombinat (Shingrix) 06/23/2020, 08/22/2020    Health Maintenance  Topic Date Due   HEMOGLOBIN A1C  06/11/2021   OPHTHALMOLOGY EXAM  07/04/2021   FOOT EXAM  12/11/2021   TETANUS/TDAP  10/09/2025   Pneumonia Vaccine 63+ Years old  Completed   INFLUENZA VACCINE  Completed   COVID-19 Vaccine  Completed   Zoster Vaccines- Shingrix  Completed   HPV VACCINES  Aged Out       Assessment  This is a routine wellness examination for Ryan Bernard.  Health Maintenance: Due or Overdue There are no preventive care reminders to display for this patient.  Ryan Bernard does not need a referral for Community Assistance: Care Management:   no Social Work:    no Prescription Assistance:  no Nutrition/Diabetes Education:  no   Plan:  Personalized Goals  Goals Addressed               This Visit's Progress     Patient Stated (pt-stated)        04/23/2021 AWV Goal: Exercise for General Health  Patient will verbalize understanding of the  benefits of increased physical activity: Exercising regularly is important. It will improve your overall fitness, flexibility, and endurance. Regular exercise also will improve your overall health. It can help you control your weight, reduce stress, and improve your bone density. Over the next year, patient will increase physical activity as tolerated with a goal of at least 150 minutes of moderate physical activity per week.  You can tell that you are exercising at a moderate intensity if your heart starts beating faster and you start breathing faster but can still hold a conversation. Moderate-intensity exercise ideas include: Walking 1 mile (1.6 km) in about 15 minutes Biking Hiking Golfing Dancing Water aerobics Patient will verbalize understanding of everyday activities that increase physical activity by providing examples like the following: Yard work, such as: Sales promotion account executive Gardening Washing windows or floors Patient will be able to explain general safety guidelines for exercising:  Before you start a new exercise program, talk with your health care provider. Ryan Bernard not exercise so much that you hurt yourself, feel dizzy, or get very short of breath. Wear comfortable clothes and wear shoes with good support. Drink plenty of water while you exercise to prevent dehydration or heat stroke. Work out until your breathing and your heartbeat get faster.        Personalized Health Maintenance & Screening Recommendations  There are no preventive care reminders to display for this patient.   Lung Cancer Screening Recommended: no (Low Dose CT Chest recommended if Age 48-80 years, 30 pack-year currently smoking OR have quit w/in past 15 years) Hepatitis C Screening recommended: no HIV Screening recommended: no  Advanced Directives: Written information was not prepared per patient's request.  Referrals  &  Orders No orders of the defined types were placed in this encounter.   Follow-up Plan Follow-up with Ryan Nutting, Ryan Bernard as planned Medicare wellness visit in one year. AVS printed and mailed to the patient.   I have personally reviewed and noted the following in the patients chart:   Medical and social history Use of alcohol, tobacco or illicit drugs  Current medications and supplements Functional ability and status Nutritional status Physical activity Advanced directives List of other physicians Hospitalizations, surgeries, and ER visits in previous 12 months Vitals Screenings to include cognitive, depression, and falls Referrals and appointments  In addition, I have reviewed and discussed with Ryan Bernard certain preventive protocols, quality metrics, and best practice recommendations. A written personalized care plan for preventive services as well as general preventive health recommendations is available and can be mailed to the patient at his request.      Ryan Gens, RN  04/23/2021

## 2021-05-09 DIAGNOSIS — I255 Ischemic cardiomyopathy: Secondary | ICD-10-CM | POA: Diagnosis not present

## 2021-05-09 DIAGNOSIS — I482 Chronic atrial fibrillation, unspecified: Secondary | ICD-10-CM | POA: Diagnosis not present

## 2021-06-11 ENCOUNTER — Ambulatory Visit: Payer: Medicare Other | Admitting: Family Medicine

## 2021-06-19 ENCOUNTER — Ambulatory Visit (INDEPENDENT_AMBULATORY_CARE_PROVIDER_SITE_OTHER): Payer: Medicare PPO | Admitting: Family Medicine

## 2021-06-19 ENCOUNTER — Encounter: Payer: Self-pay | Admitting: Family Medicine

## 2021-06-19 DIAGNOSIS — E785 Hyperlipidemia, unspecified: Secondary | ICD-10-CM

## 2021-06-19 DIAGNOSIS — I10 Essential (primary) hypertension: Secondary | ICD-10-CM | POA: Diagnosis not present

## 2021-06-19 DIAGNOSIS — R350 Frequency of micturition: Secondary | ICD-10-CM

## 2021-06-19 DIAGNOSIS — R441 Visual hallucinations: Secondary | ICD-10-CM | POA: Insufficient documentation

## 2021-06-19 DIAGNOSIS — E119 Type 2 diabetes mellitus without complications: Secondary | ICD-10-CM | POA: Diagnosis not present

## 2021-06-19 DIAGNOSIS — I4891 Unspecified atrial fibrillation: Secondary | ICD-10-CM

## 2021-06-19 DIAGNOSIS — N401 Enlarged prostate with lower urinary tract symptoms: Secondary | ICD-10-CM

## 2021-06-19 MED ORDER — PRAVASTATIN SODIUM 80 MG PO TABS: 80.0000 mg | ORAL_TABLET | Freq: Every day | ORAL | 3 refills | Status: DC

## 2021-06-19 NOTE — Assessment & Plan Note (Signed)
Continue pravastatin at current strength.  ?Lab Results  ?Component Value Date  ? LDLCALC 73 12/11/2020  ? ? ?

## 2021-06-19 NOTE — Assessment & Plan Note (Signed)
Stable with hytrin, continue at current strength.  ?

## 2021-06-19 NOTE — Patient Instructions (Signed)
Great to see you today! ?Continue current medications.  ?Follow up with me in 6 months.  We'll plan to check labs at this visit.  ?

## 2021-06-19 NOTE — Assessment & Plan Note (Signed)
This has been well controlled with diet.  He is on jardiance for CHF but this is helpful for his diabetes as well.  ?

## 2021-06-19 NOTE — Assessment & Plan Note (Signed)
Chronic a. Fib.  Management per cardiology.  Rate controlled with metoprolol and anticoagulated with eliquis.  Stable at this time.  ?

## 2021-06-19 NOTE — Assessment & Plan Note (Signed)
BP readings at home reviewed and are well controlled.  Range at home: 99-127/65-97, most readings in the 120-130/80-87 range.  ?

## 2021-06-19 NOTE — Progress Notes (Signed)
?Ryan Bernard - 88 y.o. male MRN [id number]  Date of birth: 21-Apr-1931 ? ?Subjective ?No chief complaint on file. ? ? ?HPI ?[name] is a 88 y.o. male here today for follow up visit.  He reports he is doing well.  ?History since last visit:  Recently seen by his cardiologist.  Remains in A. Fib on recent EKG.  Denies significant symptoms.  Remains on eliquis and metoprolol.  Denies anginal symptoms.   Additionally he is on furosemide, jardiance and aldactone for control of excess fluid related to ischemic cardiomyopathy.  ? ?He is tolerating pravastatin well.  No side effects related to medication.  Last lipid 12/2020, well controlled.  ? ?He has noted some visual hallucinations.  Reports seeing family members who have passed including cousins, his wife, etc.  These occur occasionally and have been happening for about 2 years.  Denies any memory changes.  No parkinsonian symptoms.  These do not bother him and he actually finds some comfort in this.   ? ?ROS:  A comprehensive ROS was completed and negative except as noted per HPI ? ? ? ?No Known Allergies ? ?Past Medical History:  ?Diagnosis Date  ? Anemia   ? Arthritis   ? BPH (benign prostatic hyperplasia)   ? CAD (coronary artery disease) 03/11/1993  ? w/ angioplasty  ? CHF (congestive heart failure) ([place])   ? Chronic kidney disease   ? Diabetes mellitus 03/11/2002  ? type 2  ? Heart disease   ? Mild valvular w/ pulm HTN  ? Heart murmur   ? Hyperlipidemia   ? Hypertension   ? SBE (subacute bacterial endocarditis) prophylaxis candidate   ? ? ?Past Surgical History:  ?Procedure Laterality Date  ? ANGIOPLASTY  03/11/1993  ? [facility]  ? BACK SURGERY  03/11/1962  ? CORONARY ARTERY BYPASS GRAFT  10/27/2009  ? 5 vessel, Dr. Glenford Bayley  ? EYE SURGERY    ? HERNIA REPAIR    ? x 2   ? ? ?Social History  ? ?Socioeconomic History  ? Marital status: Widowed  ?  Spouse name: Not on file  ? Number of children: 2  ? Years of education: 39  ? Highest education level: 12th grade   ?Occupational History  ? 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 Smoking status: Never  ? Smokeless tobacco: Never  ?Vaping Use  ? Vaping Use: Never used  ?Substance and Sexual Activity  ? Alcohol use: No  ? Drug use: No  ? Sexual activity: Not Currently  ?Other Topics Concern  ? Not on file  ?Social History Narrative  ? Lives with his son. He has two children. Sits around house and watches TV and walks in the yard.  ? ?Social Determinants of Health  ? ?Financial Resource Strain: Low Risk   ? Difficulty of Paying Living Expenses: Not hard at all  ?Food Insecurity: No Food Insecurity  ? Worried About Charity fundraiser in the Last Year: Never true  ? Ran Out of Food in the Last Year: Never true  ?Transportation Needs: No Transportation Needs  ? Lack of Transportation (Medical): No  ? Lack of Transportation (Non-Medical): No  ?Physical Activity: Inactive  ? Days of Exercise per Week: 0 days  ? Minutes of Exercise per Session: 0 min  ?Stress: No Stress Concern Present  ? Feeling of Stress : Not at all  ?Social Connections: Moderately Isolated  ? Frequency of Communication with Friends and Family:  More than three times a week  ? Frequency of Social Gatherings with Friends and Family: More than three times a week  ? Attends Religious Services: 1 to 4 times per year  ? Active Member of Clubs or Organizations: No  ? Attends Archivist Meetings: Never  ? Marital Status: Widowed  ? ? ?Family History  ?Problem Relation Age of Onset  ? Heart disease Mother   ? Hypertension Mother   ? Alcohol abuse Father   ? Depression Daughter   ? Depression Son   ? ? ?Health Maintenance  ?Topic Date Due  ? HEMOGLOBIN A1C  06/11/2021  ? OPHTHALMOLOGY EXAM  07/04/2021  ? INFLUENZA VACCINE  10/09/2021  ? FOOT EXAM  12/11/2021  ? TETANUS/TDAP  10/09/2025  ? Pneumonia Vaccine 98+ Years old  Completed  ? COVID-19 Vaccine  Completed  ? Zoster Vaccines- Shingrix  Completed  ? HPV VACCINES  Aged Out   ? ? ? ?----------------------------------------------------------------------------------------------------------------------------------------------------------------------------------------------------------------- ?Physical Exam ?BP (!) 149/87 (BP Location: Left Arm, Patient Position: Sitting, Cuff Size: Small)   Pulse (!) 103   Ht 5\' 7"  (1.702 m)   Wt 134 lb (60.8 kg)   SpO2 91%   BMI 20.99 kg/m?  ? ?Physical Exam ?Constitutional:   ?   Appearance: Normal appearance.  ?Eyes:  ?   General: No scleral icterus. ?Cardiovascular:  ?   Rate and Rhythm: Normal rate. Rhythm irregular.  ?Pulmonary:  ?   Effort: Pulmonary effort is normal.  ?   Breath sounds: Normal breath sounds.  ?Musculoskeletal:  ?   Cervical back: Neck supple.  ?Neurological:  ?   Mental Status: He is alert.  ?Psychiatric:     ?   Mood and Affect: Mood normal.     ?   Behavior: Behavior normal.  ? ? ?------------------------------------------------------------------------------------------------------------------------------------------------------------------------------------------------------------------- ?Assessment and Plan ? ?Atrial fibrillation ([place]) ?Chronic a. Fib.  Management per cardiology.  Rate controlled with metoprolol and anticoagulated with eliquis.  Stable at this time.  ? ?Essential hypertension, benign ?BP readings at home reviewed and are well controlled.  Range at home: 99-127/65-97, most readings in the 120-130/80-87 range.  ? ?Controlled type 2 diabetes mellitus without complication ([place]) ?This has been well controlled with diet.  He is on jardiance for CHF but this is helpful for his diabetes as well.  ? ?BPH (benign prostatic hyperplasia) ?Stable with hytrin, continue at current strength.  ? ?Dyslipidemia ?Continue pravastatin at current strength.  ?Lab Results  ?Component Value Date  ? [place] 75 12/11/2020  ? ? ? ?Visual hallucinations ?Have been occurring over the past 2 years.  No neurological change including  memory loss.  No parkinsonian symptoms.  These do not bother him at this time.  Will continue to monitor.   ? ?No orders of the defined types were placed in this encounter. ? ? ?No follow-ups on file. ? ? ? ?This visit occurred during the SARS-CoV-2 public health emergency.  Safety protocols were in place, including screening questions prior to the visit, additional usage of staff PPE, and extensive cleaning of exam room while observing appropriate contact time as indicated for disinfecting solutions.  ? ? ?

## 2021-06-19 NOTE — Assessment & Plan Note (Signed)
Have been occurring over the past 2 years.  No neurological change including memory loss.  No parkinsonian symptoms.  These do not bother him at this time.  Will continue to monitor.   ?

## 2021-07-11 LAB — HM DIABETES EYE EXAM

## 2021-08-09 DIAGNOSIS — I350 Nonrheumatic aortic (valve) stenosis: Secondary | ICD-10-CM | POA: Diagnosis not present

## 2021-08-09 DIAGNOSIS — I482 Chronic atrial fibrillation, unspecified: Secondary | ICD-10-CM | POA: Diagnosis not present

## 2021-08-09 DIAGNOSIS — E876 Hypokalemia: Secondary | ICD-10-CM | POA: Diagnosis not present

## 2021-08-09 DIAGNOSIS — D649 Anemia, unspecified: Secondary | ICD-10-CM | POA: Diagnosis not present

## 2021-08-09 DIAGNOSIS — I255 Ischemic cardiomyopathy: Secondary | ICD-10-CM | POA: Diagnosis not present

## 2021-11-14 DIAGNOSIS — I482 Chronic atrial fibrillation, unspecified: Secondary | ICD-10-CM | POA: Diagnosis not present

## 2021-11-14 DIAGNOSIS — L603 Nail dystrophy: Secondary | ICD-10-CM | POA: Insufficient documentation

## 2021-11-14 DIAGNOSIS — I350 Nonrheumatic aortic (valve) stenosis: Secondary | ICD-10-CM | POA: Diagnosis not present

## 2021-11-14 DIAGNOSIS — I255 Ischemic cardiomyopathy: Secondary | ICD-10-CM | POA: Diagnosis not present

## 2021-12-17 ENCOUNTER — Ambulatory Visit (INDEPENDENT_AMBULATORY_CARE_PROVIDER_SITE_OTHER): Payer: Medicare PPO | Admitting: Family Medicine

## 2021-12-17 ENCOUNTER — Encounter: Payer: Self-pay | Admitting: Family Medicine

## 2021-12-17 VITALS — BP 127/70 | HR 82 | Ht 67.0 in | Wt 132.0 lb

## 2021-12-17 DIAGNOSIS — Z0000 Encounter for general adult medical examination without abnormal findings: Secondary | ICD-10-CM | POA: Diagnosis not present

## 2021-12-17 DIAGNOSIS — E785 Hyperlipidemia, unspecified: Secondary | ICD-10-CM | POA: Diagnosis not present

## 2021-12-17 DIAGNOSIS — I350 Nonrheumatic aortic (valve) stenosis: Secondary | ICD-10-CM | POA: Diagnosis not present

## 2021-12-17 DIAGNOSIS — E038 Other specified hypothyroidism: Secondary | ICD-10-CM

## 2021-12-17 DIAGNOSIS — E119 Type 2 diabetes mellitus without complications: Secondary | ICD-10-CM | POA: Diagnosis not present

## 2021-12-17 DIAGNOSIS — N183 Chronic kidney disease, stage 3 unspecified: Secondary | ICD-10-CM | POA: Diagnosis not present

## 2021-12-17 DIAGNOSIS — R7303 Prediabetes: Secondary | ICD-10-CM | POA: Diagnosis not present

## 2021-12-17 DIAGNOSIS — J449 Chronic obstructive pulmonary disease, unspecified: Secondary | ICD-10-CM | POA: Insufficient documentation

## 2021-12-17 DIAGNOSIS — Z23 Encounter for immunization: Secondary | ICD-10-CM | POA: Diagnosis not present

## 2021-12-17 HISTORY — DX: Encounter for general adult medical examination without abnormal findings: Z00.00

## 2021-12-17 HISTORY — DX: Chronic obstructive pulmonary disease, unspecified: J44.9

## 2021-12-17 NOTE — Progress Notes (Signed)
Ryan Bernard - 88 y.o. male MRN [id number]  Date of birth: 14-Jul-1931  Subjective Chief Complaint  Patient presents with   Annual Exam    HPI Ryan Bernard is a 88 y.o. male here today for annual exam.   He reports that he feels "[place]" today.    Has dual care through the [place] and has medications prescribed through his PCP there.    He continues to stay pretty active.  Follows a healthy diet.   Non-smoker. Denies EtOH use.   Would like flu vaccine.  He has not had RSV vaccine.   Review of Systems  Constitutional:  Negative for chills, fever, malaise/fatigue and weight loss.  HENT:  Negative for congestion, ear pain and sore throat.   Eyes:  Negative for blurred vision, double vision and pain.  Respiratory:  Negative for cough and shortness of breath.   Cardiovascular:  Negative for chest pain and palpitations.  Gastrointestinal:  Negative for abdominal pain, blood in stool, constipation, heartburn and nausea.  Genitourinary:  Negative for dysuria and urgency.  Musculoskeletal:  Negative for joint pain and myalgias.  Neurological:  Negative for dizziness and headaches.  Endo/Heme/Allergies:  Does not bruise/bleed easily.  Psychiatric/Behavioral:  Negative for depression. The patient is not nervous/anxious and does not have insomnia.     No Known Allergies  Past Medical History:  Diagnosis Date   Anemia    Arthritis    BPH (benign prostatic hyperplasia)    CAD (coronary artery disease) 03/11/1993   w/ angioplasty   CHF (congestive heart failure) ([facility])    Chronic kidney disease    Chronic obstructive pulmonary disease ([place]) 12/17/2021   Diabetes mellitus 03/11/2002   type 2   Heart disease    Mild valvular w/ pulm HTN   Heart murmur    Hyperlipidemia    Hypertension    SBE (subacute bacterial endocarditis) prophylaxis candidate     Past Surgical History:  Procedure Laterality Date   ANGIOPLASTY  03/11/1993   [facility]   BACK SURGERY  03/11/1962   CORONARY  ARTERY BYPASS GRAFT  10/27/2009   5 vessel, Dr. Glenford Bayley   EYE SURGERY     HERNIA REPAIR     x 2     Social History   Socioeconomic History   Marital status: Widowed    Spouse name: Not on file   Number of children: 2   Years of education: 16   Highest education level: 12th grade  Occupational History   Occupation: retired    Comment: Writer  Tobacco Use   Smoking status: Never   Smokeless tobacco: Never  Vaping Use   Vaping Use: Never used  Substance and Sexual Activity   Alcohol use: No   Drug use: No   Sexual activity: Not Currently  Other Topics Concern   Not on file  Social History Narrative   Lives with his son. He has two children. Sits around house and watches TV and walks in the yard.   Social Determinants of Health   Financial Resource Strain: Low Risk  (04/23/2021)   Overall Financial Resource Strain (CARDIA)    Difficulty of Paying Living Expenses: Not hard at all  Food Insecurity: No Food Insecurity (04/23/2021)   Hunger Vital Sign    Worried About Running Out of Food in the Last Year: Never true    Ran Out of Food in the Last Year: Never true  Transportation Needs: No Transportation Needs (04/23/2021)   PRAPARE -  Hydrologist (Medical): No    Lack of Transportation (Non-Medical): No  Physical Activity: Inactive (04/23/2021)   Exercise Vital Sign    Days of Exercise per Week: 0 days    Minutes of Exercise per Session: 0 min  Stress: No Stress Concern Present (04/23/2021)   [place]    Feeling of Stress : Not at all  Social Connections: Moderately Isolated (04/23/2021)   Social Connection and Isolation Panel [NHANES]    Frequency of Communication with Friends and Family: More than three times a week    Frequency of Social Gatherings with Friends and Family: More than three times a week    Attends Religious Services: 1 to 4 times per year    Active  Member of [employer] or Organizations: No    Attends Archivist Meetings: Never    Marital Status: Widowed    Family History  Problem Relation Age of Onset   Heart disease Mother    Hypertension Mother    Alcohol abuse Father    Depression Daughter    Depression Son     Health Maintenance  Topic Date Due   COVID-19 Vaccine (7 - Mixed Product risk series) 05/08/2020   HEMOGLOBIN A1C  06/11/2021   OPHTHALMOLOGY EXAM  07/04/2021   INFLUENZA VACCINE  10/09/2021   FOOT EXAM  12/11/2021   TETANUS/TDAP  10/09/2025   Pneumonia Vaccine 36+ Years old  Completed   Zoster Vaccines- Shingrix  Completed   HPV VACCINES  Aged Out     ----------------------------------------------------------------------------------------------------------------------------------------------------------------------------------------------------------------- Physical Exam BP 127/70 (BP Location: Left Arm, Patient Position: Sitting, Cuff Size: Normal)   Pulse 82   Ht 5\' 7"  (1.702 m)   Wt 132 lb (59.9 kg)   SpO2 100%   BMI 20.67 kg/m   Physical Exam Constitutional:      General: He is not in acute distress. HENT:     Head: Normocephalic and atraumatic.     Right Ear: Tympanic membrane and external ear normal.     Left Ear: Tympanic membrane and external ear normal.  Eyes:     General: No scleral icterus. Neck:     Thyroid: No thyromegaly.  Cardiovascular:     Rate and Rhythm: Normal rate and regular rhythm.     Heart sounds: Normal heart sounds.     Comments: 3/6 systolic ejection murmur Pulmonary:     Effort: Pulmonary effort is normal.     Breath sounds: Normal breath sounds.  Abdominal:     General: Bowel sounds are normal. There is no distension.     Palpations: Abdomen is soft.     Tenderness: There is no abdominal tenderness. There is no guarding.  Musculoskeletal:     Cervical back: Normal range of motion.  Lymphadenopathy:     Cervical: No cervical adenopathy.  Skin:     General: Skin is warm and dry.     Findings: No rash.  Neurological:     Mental Status: He is alert and oriented to person, place, and time.     Cranial Nerves: No cranial nerve deficit.     Motor: No abnormal muscle tone.  Psychiatric:        Mood and Affect: Mood normal.        Behavior: Behavior normal.     ------------------------------------------------------------------------------------------------------------------------------------------------------------------------------------------------------------------- Assessment and Plan  Moderate aortic stenosis Denies symptoms at this time.   Prediabetes Currently on farxiga.  Blood sugars  are well controlled at this time.   Chronic kidney disease, stage 3 Update renal function.  Continue farxiga.   Well adult exam Well adult Orders Placed This Encounter  Procedures   COMPLETE METABOLIC PANEL WITH GFR   CBC with Differential   Lipid Panel w/reflex Direct LDL   HgB A1c   TSH   T4, free  Screenings: Per lab orders Immunizations: Flu vaccine.  RSV vaccine recommended. He will see if this is available through the [place].  Anticipatory guidance/Risk factor reduction: Recommendations per AVS.    No orders of the defined types were placed in this encounter.   Return in about 6 months (around 06/18/2022) for HTN/T2DM.    This visit occurred during the SARS-CoV-2 public health emergency.  Safety protocols were in place, including screening questions prior to the visit, additional usage of staff PPE, and extensive cleaning of exam room while observing appropriate contact time as indicated for disinfecting solutions.

## 2021-12-17 NOTE — Assessment & Plan Note (Signed)
Denies symptoms at this time. 

## 2021-12-17 NOTE — Assessment & Plan Note (Signed)
Well adult Orders Placed This Encounter  Procedures  . COMPLETE METABOLIC PANEL WITH GFR  . CBC with Differential  . Lipid Panel w/reflex Direct LDL  . HgB A1c  . TSH  . T4, free  Screenings: Per lab orders Immunizations: Flu vaccine.  RSV vaccine recommended. He will see if this is available through the [place].  Anticipatory guidance/Risk factor reduction: Recommendations per AVS.

## 2021-12-17 NOTE — Patient Instructions (Signed)
Preventive Care 65 Years and Older, Male Preventive care refers to lifestyle choices and visits with your health care provider that can promote health and wellness. Preventive care visits are also called wellness exams. What can I expect for my preventive care visit? Counseling During your preventive care visit, your health care provider may ask about your: Medical history, including: Past medical problems. Family medical history. History of falls. Current health, including: Emotional well-being. Home life and relationship well-being. Sexual activity. Memory and ability to understand (cognition). Lifestyle, including: Alcohol, nicotine or tobacco, and drug use. Access to firearms. Diet, exercise, and sleep habits. Work and work environment. Sunscreen use. Safety issues such as seatbelt and bike helmet use. Physical exam Your health care provider will check your: Height and weight. These may be used to calculate your BMI (body mass index). BMI is a measurement that tells if you are at a healthy weight. Waist circumference. This measures the distance around your waistline. This measurement also tells if you are at a healthy weight and may help predict your risk of certain diseases, such as type 2 diabetes and high blood pressure. Heart rate and blood pressure. Body temperature. Skin for abnormal spots. What immunizations do I need?  Vaccines are usually given at various ages, according to a schedule. Your health care provider will recommend vaccines for you based on your age, medical history, and lifestyle or other factors, such as travel or where you work. What tests do I need? Screening Your health care provider may recommend screening tests for certain conditions. This may include: Lipid and cholesterol levels. Diabetes screening. This is done by checking your blood sugar (glucose) after you have not eaten for a while (fasting). Hepatitis C test. Hepatitis B test. HIV (human  immunodeficiency virus) test. STI (sexually transmitted infection) testing, if you are at risk. Lung cancer screening. Colorectal cancer screening. Prostate cancer screening. Abdominal aortic aneurysm (AAA) screening. You may need this if you are a current or former smoker. Talk with your health care provider about your test results, treatment options, and if necessary, the need for more tests. Follow these instructions at home: Eating and drinking  Eat a diet that includes fresh fruits and vegetables, whole grains, lean protein, and low-fat dairy products. Limit your intake of foods with high amounts of sugar, saturated fats, and salt. Take vitamin and mineral supplements as recommended by your health care provider. Do not drink alcohol if your health care provider tells you not to drink. If you drink alcohol: Limit how much you have to 0-2 drinks a day. Know how much alcohol is in your drink. In the [place] one drink equals one 12 oz bottle of beer (355 mL), one 5 oz glass of wine (148 mL), or one 1 oz glass of hard liquor (44 mL). Lifestyle Brush your teeth every morning and night with fluoride toothpaste. Floss one time each day. Exercise for at least 30 minutes 5 or more days each week. Do not use any products that contain nicotine or tobacco. These products include cigarettes, chewing tobacco, and vaping devices, such as e-cigarettes. If you need help quitting, ask your health care provider. Do not use drugs. If you are sexually active, practice safe sex. Use a condom or other form of protection to prevent STIs. Take aspirin only as told by your health care provider. Make sure that you understand how much to take and what form to take. Work with your health care provider to find out whether it is safe   and beneficial for you to take aspirin daily. Ask your health care provider if you need to take a cholesterol-lowering medicine (statin). Find healthy ways to manage stress, such  as: Meditation, yoga, or listening to music. Journaling. Talking to a trusted person. Spending time with friends and family. Safety Always wear your seat belt while driving or riding in a vehicle. Do not drive: If you have been drinking alcohol. Do not ride with someone who has been drinking. When you are tired or distracted. While texting. If you have been using any mind-altering substances or drugs. Wear a helmet and other protective equipment during sports activities. If you have firearms in your house, make sure you follow all gun safety procedures. Minimize exposure to UV radiation to reduce your risk of skin cancer. What's next? Visit your health care provider once a year for an annual wellness visit. Ask your health care provider how often you should have your eyes and teeth checked. Stay up to date on all vaccines. This information is not intended to replace advice given to you by your health care provider. Make sure you discuss any questions you have with your health care provider. Document Revised: 08/23/2020 Document Reviewed: 08/23/2020 Elsevier Patient Education  2023 Elsevier Inc.  

## 2021-12-17 NOTE — Assessment & Plan Note (Signed)
Currently on farxiga.  Blood sugars are well controlled at this time.

## 2021-12-17 NOTE — Addendum Note (Signed)
Addended by: Peggye Ley on: 12/17/2021 10:06 AM   Modules accepted: Orders

## 2021-12-17 NOTE — Assessment & Plan Note (Signed)
Update renal function.  Continue farxiga.

## 2021-12-18 LAB — COMPLETE METABOLIC PANEL WITH GFR
AG Ratio: 1.2 (calc) (ref 1.0–2.5)
ALT: 20 U/L (ref 9–46)
AST: 17 U/L (ref 10–35)
Albumin: 4.5 g/dL (ref 3.6–5.1)
Alkaline phosphatase (APISO): 105 U/L (ref 35–144)
BUN/Creatinine Ratio: 25 (calc) — ABNORMAL HIGH (ref 6–22)
BUN: 33 mg/dL — ABNORMAL HIGH (ref 7–25)
CO2: 25 mmol/L (ref 20–32)
Calcium: 10 mg/dL (ref 8.6–10.3)
Chloride: 101 mmol/L (ref 98–110)
Creat: 1.32 mg/dL — ABNORMAL HIGH (ref 0.70–1.22)
Globulin: 3.8 g/dL (calc) — ABNORMAL HIGH (ref 1.9–3.7)
Glucose, Bld: 98 mg/dL (ref 65–99)
Potassium: 4.8 mmol/L (ref 3.5–5.3)
Sodium: 142 mmol/L (ref 135–146)
Total Bilirubin: 0.9 mg/dL (ref 0.2–1.2)
Total Protein: 8.3 g/dL — ABNORMAL HIGH (ref 6.1–8.1)
eGFR: 52 mL/min/{1.73_m2} — ABNORMAL LOW (ref >=60)

## 2021-12-18 LAB — CBC WITH DIFFERENTIAL/PLATELET
Absolute Monocytes: 302 cells/uL (ref 200–950)
Basophils Absolute: 31 cells/uL (ref 0–200)
Basophils Relative: 0.6 %
Eosinophils Absolute: 130 cells/uL (ref 15–500)
Eosinophils Relative: 2.5 %
HCT: 46.4 % (ref 38.5–50.0)
Hemoglobin: 15.8 g/dL (ref 13.2–17.1)
Lymphs Abs: 962 cells/uL (ref 850–3900)
MCH: 32.6 pg (ref 27.0–33.0)
MCHC: 34.1 g/dL (ref 32.0–36.0)
MCV: 95.7 fL (ref 80.0–100.0)
MPV: 10.8 fL (ref 7.5–12.5)
Monocytes Relative: 5.8 %
Neutro Abs: 3775 cells/uL (ref 1500–7800)
Neutrophils Relative %: 72.6 %
Platelets: 140 10*3/uL (ref 140–400)
RBC: 4.85 10*6/uL (ref 4.20–5.80)
RDW: 12.3 % (ref 11.0–15.0)
Total Lymphocyte: 18.5 %
WBC: 5.2 10*3/uL (ref 3.8–10.8)

## 2021-12-18 LAB — HEMOGLOBIN A1C
Hgb A1c MFr Bld: 6.3 % of total Hgb — ABNORMAL HIGH (ref <5.7)
Mean Plasma Glucose: 134 mg/dL
eAG (mmol/L): 7.4 mmol/L

## 2021-12-18 LAB — LIPID PANEL W/REFLEX DIRECT LDL
Cholesterol: 181 mg/dL (ref <200)
HDL: 55 mg/dL (ref >=40)
LDL Cholesterol (Calc): 111 mg/dL (calc) — ABNORMAL HIGH
Non-HDL Cholesterol (Calc): 126 mg/dL (calc) (ref <130)
Total CHOL/HDL Ratio: 3.3 (calc) (ref <5.0)
Triglycerides: 65 mg/dL (ref <150)

## 2021-12-18 LAB — TSH: TSH: 10.09 mIU/L — ABNORMAL HIGH (ref 0.40–4.50)

## 2021-12-18 LAB — T4, FREE: Free T4: 1 ng/dL (ref 0.8–1.8)

## 2021-12-19 ENCOUNTER — Encounter: Payer: Self-pay | Admitting: Family Medicine

## 2022-02-14 DIAGNOSIS — I482 Chronic atrial fibrillation, unspecified: Secondary | ICD-10-CM | POA: Diagnosis not present

## 2022-02-14 DIAGNOSIS — I10 Essential (primary) hypertension: Secondary | ICD-10-CM | POA: Diagnosis not present

## 2022-02-14 DIAGNOSIS — I255 Ischemic cardiomyopathy: Secondary | ICD-10-CM | POA: Diagnosis not present

## 2022-02-14 DIAGNOSIS — I350 Nonrheumatic aortic (valve) stenosis: Secondary | ICD-10-CM | POA: Diagnosis not present

## 2022-04-29 ENCOUNTER — Ambulatory Visit (INDEPENDENT_AMBULATORY_CARE_PROVIDER_SITE_OTHER): Payer: Medicare PPO | Admitting: Family Medicine

## 2022-04-29 DIAGNOSIS — Z0000 Encounter for general adult medical examination without abnormal findings: Secondary | ICD-10-CM | POA: Diagnosis not present

## 2022-04-29 NOTE — Progress Notes (Signed)
MEDICARE ANNUAL WELLNESS VISIT  04/29/2022  Telephone Visit Disclaimer This Medicare AWV was conducted by telephone due to national recommendations for restrictions regarding the COVID-19 Pandemic (e.g. social distancing).  I verified, using two identifiers, that I am speaking with Ryan Bernard or their authorized healthcare agent. I discussed the limitations, risks, security, and privacy concerns of performing an evaluation and management service by telephone and the potential availability of an in-person appointment in the future. The patient expressed understanding and agreed to proceed.  Location of Patient: Home Location of Provider (nurse):  Provider home  Subjective:    Ryan Bernard is a 89 y.o. male patient of Luetta Nutting, DO who had a Medicare Annual Wellness Visit today via telephone. Ryan Bernard is Retired and lives with their son. he has 2 children. he reports that he is socially active and does interact with friends/family regularly. he is moderately physically active and enjoys walking in the yard and sitting around the house.  Patient Care Team: Luetta Nutting, DO as PCP - General (Family Medicine) Frederich Cha, MD (Inactive) (Family Medicine) Frederich Cha, MD (Inactive) (Family Medicine)     04/29/2022    2:08 PM 04/23/2021    1:52 PM 01/10/2021    2:30 PM 01/19/2020    9:24 AM 01/18/2019   10:57 AM 01/13/2018   10:53 AM 12/14/2013    1:20 PM  Advanced Directives  Does Patient Have a Medical Advance Directive? Yes Yes No Yes Yes Yes Yes  Type of Advance Directive Living will [place];Living will  [place];Living will [place];Living will [place];Living will Living will  Does patient want to make changes to medical advance directive? No - Patient declined No - Patient declined  No - Patient declined No - Patient declined No - Patient declined   Copy of [place] in Chart?  No  - copy requested  No - copy requested No - copy requested No - copy requested   Would patient like information on creating a medical advance directive?   No - Patient declined        Hospital Utilization Over the Past 12 Months: # of hospitalizations or ER visits: 0 # of surgeries: 0  Review of Systems    Patient reports that his overall health is unchanged compared to last year.  History obtained from chart review and the patient  Patient Reported Readings (BP, Pulse, CBG, Weight, etc) none  Pain Assessment Pain : No/denies pain     Current Medications & Allergies (verified) Allergies as of 04/29/2022   No Known Allergies      Medication List        Accurate as of April 29, 2022  2:14 PM. If you have any questions, ask your nurse or doctor.          albuterol 108 (90 Base) MCG/ACT inhaler Commonly known as: VENTOLIN HFA Inhale 2 puffs into the lungs every 6 (six) hours as needed for wheezing.   apixaban 5 MG Tabs tablet Commonly known as: ELIQUIS Take 5 mg by mouth 2 (two) times daily.   [employer] B12 GUMMIES PO Take 1 each by mouth daily.   dapagliflozin propanediol 10 MG Tabs tablet Commonly known as: FARXIGA Take 1 tablet by mouth daily.   empagliflozin 10 MG Tabs tablet Commonly known as: JARDIANCE Take 10 mg by mouth every morning.   furosemide 20 MG tablet Commonly known as: LASIX Take 1 tablet (20  mg total) by mouth daily.   metoprolol succinate 100 MG 24 hr tablet Commonly known as: TOPROL-XL Take 100 mg by mouth daily. Take with or immediately following a meal.   pravastatin 80 MG tablet Commonly known as: PRAVACHOL Take 1 tablet (80 mg total) by mouth at bedtime.   spironolactone 25 MG tablet Commonly known as: ALDACTONE Take 1 tablet by mouth daily.   terazosin 10 MG capsule Commonly known as: HYTRIN Take 10 mg by mouth daily.        History (reviewed): Past Medical History:  Diagnosis Date   Anemia    Arthritis    BPH  (benign prostatic hyperplasia)    CAD (coronary artery disease) 03/11/1993   w/ angioplasty   CHF (congestive heart failure) (HCC)    Chronic kidney disease    Chronic obstructive pulmonary disease ([place]) 12/17/2021   Chronic obstructive pulmonary disease ([place]) 12/17/2021   Diabetes mellitus 03/11/2002   type 2   Heart disease    Mild valvular w/ pulm HTN   Heart murmur    Hyperlipidemia    Hypertension    SBE (subacute bacterial endocarditis) prophylaxis candidate    Well adult exam 12/17/2021   Past Surgical History:  Procedure Laterality Date   ANGIOPLASTY  03/11/1993   [facility]   BACK SURGERY  03/11/1962   CORONARY ARTERY BYPASS GRAFT  10/27/2009   5 vessel, Dr. Glenford Bayley   EYE SURGERY     HERNIA REPAIR     x 2    Family History  Problem Relation Age of Onset   Heart disease Mother    Hypertension Mother    Alcohol abuse Father    Depression Daughter    Depression Son    Social History   Socioeconomic History   Marital status: Widowed    Spouse name: Not on file   Number of children: 2   Years of education: 60   Highest education level: 12th grade  Occupational History   Occupation: retired    Comment: Writer  Tobacco Use   Smoking status: Never   Smokeless tobacco: Never  Vaping Use   Vaping Use: Never used  Substance and Sexual Activity   Alcohol use: No   Drug use: No   Sexual activity: Not Currently  Other Topics Concern   Not on file  Social History Narrative   Lives with his son. He has two children. Sits around house and watches TV and walks in the yard.   Social Determinants of Health   Financial Resource Strain: Low Risk  (04/28/2022)   Overall Financial Resource Strain (CARDIA)    Difficulty of Paying Living Expenses: Not hard at all  Food Insecurity: No Food Insecurity (04/28/2022)   Hunger Vital Sign    Worried About Running Out of Food in the Last Year: Never true    Ran Out of Food in the Last Year: Never true   Transportation Needs: No Transportation Needs (04/28/2022)   PRAPARE - Hydrologist (Medical): No    Lack of Transportation (Non-Medical): No  Physical Activity: Insufficiently Active (04/28/2022)   Exercise Vital Sign    Days of Exercise per Week: 3 days    Minutes of Exercise per Session: 40 min  Stress: No Stress Concern Present (04/28/2022)   [place]    Feeling of Stress : Not at all  Social Connections: Moderately Integrated (04/29/2022)   Social Connection and  Isolation Panel [NHANES]    Frequency of Communication with Friends and Family: More than three times a week    Frequency of Social Gatherings with Friends and Family: More than three times a week    Attends Religious Services: 1 to 4 times per year    Active Member of [employer] or Organizations: Yes    Attends Archivist Meetings: More than 4 times per year    Marital Status: Widowed    Activities of Daily Living    04/28/2022    6:13 PM  In your present state of health, do you have any difficulty performing the following activities:  Hearing? 0  Vision? 0  Difficulty concentrating or making decisions? 0  Walking or climbing stairs? 0  Dressing or bathing? 0  Doing errands, shopping? 0  Preparing Food and eating ? N  Using the Toilet? N  In the past six months, have you accidently leaked urine? N  Do you have problems with loss of bowel control? N  Managing your Medications? N  Managing your Finances? N  Housekeeping or managing your Housekeeping? N    Patient Education/ Literacy How often do you need to have someone help you when you read instructions, pamphlets, or other written materials from your doctor or pharmacy?: 1 - Never What is the last grade level you completed in school?: 12th grade  Exercise Current Exercise Habits: Home exercise routine, Type of exercise: Other - see comments;walking (stationary  bike), Time (Minutes): 40, Frequency (Times/Week): 3, Weekly Exercise (Minutes/Week): 120, Intensity: Moderate, Exercise limited by: None identified  Diet Patient reports consuming 2 meals a day and 1 snack(s) a day Patient reports that his primary diet is: Regular Patient reports that she does have regular access to food.   Depression Screen    04/29/2022    2:04 PM 12/17/2021    9:28 AM 04/23/2021    1:49 PM 12/11/2020    8:20 AM 01/19/2020    9:25 AM 08/25/2019    8:22 AM 08/25/2019    8:21 AM  PHQ 2/9 Scores  PHQ - 2 Score 0 2 0 0 0 0 0  PHQ- 9 Score  5    6 3     $ Fall Risk    04/29/2022    2:04 PM 04/28/2022    6:13 PM 12/17/2021    9:28 AM 04/23/2021    1:49 PM 04/22/2021    5:55 PM  Fall Risk   Falls in the past year? 0 0 0 1 1  Number falls in past yr: 0 0 0 1 1  Injury with Fall? 0 0 0 1 1  Risk for fall due to : No Fall Risks  No Fall Risks History of fall(s);Medication side effect   Follow up Falls evaluation completed  Falls evaluation completed Falls evaluation completed;Education provided;Falls prevention discussed      Objective:  Ryan Bernard seemed alert and oriented and he participated appropriately during our telephone visit.  Blood Pressure Weight BMI  BP Readings from Last 3 Encounters:  12/17/21 127/70  06/19/21 (!) 149/87  04/23/21 119/82   Wt Readings from Last 3 Encounters:  12/17/21 132 lb (59.9 kg)  06/19/21 134 lb (60.8 kg)  03/30/21 138 lb (62.6 kg)   BMI Readings from Last 1 Encounters:  12/17/21 20.67 kg/m    *Unable to obtain current vital signs, weight, and BMI due to telephone visit type  Hearing/Vision  Ryan Bernard did not seem to have difficulty with hearing/understanding during  the telephone conversation Reports that he has had a formal eye exam by an eye care professional within the past year Reports that he has not had a formal hearing evaluation within the past year *Unable to fully assess hearing and vision during telephone visit  type  Cognitive Function:    04/29/2022    2:09 PM 04/23/2021    1:56 PM 01/19/2020    9:26 AM 01/18/2019   11:01 AM 01/13/2018   10:57 AM  6CIT Screen  What Year? 4 points 0 points 0 points 0 points 0 points  What month? 0 points 0 points 0 points 0 points 0 points  What time? 0 points 0 points 0 points 0 points 0 points  Count back from 20 0 points 0 points 0 points 0 points 0 points  Months in reverse 2 points 0 points 0 points  0 points  Repeat phrase 2 points 0 points 10 points 4 points 0 points  Total Score 8 points 0 points 10 points  0 points   (Normal:0-7, Significant for Dysfunction: >8)  Normal Cognitive Function Screening: No.    Immunization & Health Maintenance Record Immunization History  Administered Date(s) Administered   Fluad Quad(high Dose 65+) 11/02/2018, 12/21/2019, 12/11/2020, 12/17/2021   Influenza Split 12/24/2011, 12/09/2012, 12/09/2013   Influenza Whole 01/09/2009, 12/20/2009, 12/28/2010   Influenza, High Dose Seasonal PF 12/09/2009, 01/10/2011, 12/10/2011, 11/02/2015, 10/09/2016, 10/30/2016, 12/01/2017   Influenza, Seasonal, Injecte, Preservative Fre 11/06/2012, 12/14/2013, 01/10/2015   Influenza,inj,Quad PF,6+ Mos 11/06/2012, 12/14/2013, 01/10/2015   Influenza-Unspecified 12/09/2000, 12/09/2001, 12/10/2002, 01/16/2004, 01/09/2005, 01/09/2006, 01/10/2007, 11/24/2007, 01/09/2009, 11/02/2015, 10/30/2016, 12/01/2017, 12/09/2018, 12/21/2019, 12/18/2021   Moderna Sars-Covid-2 Vaccination 04/23/2019, 05/21/2019, 09/20/2019, 03/13/2020   PFIZER(Purple Top)SARS-COV-2 Vaccination 05/04/2019, 05/25/2019   Pneumococcal Conjugate-13 03/16/2014   Pneumococcal Polysaccharide-23 08/20/2011   Pneumococcal-Unspecified 12/09/2000   Rsv, Bivalent, Protein Subunit Rsvpref,pf (Abrysvo) 03/28/2022   Td 12/10/2002   Tdap 10/10/2015   Tetanus 09/08/2009   Zoster Recombinat (Shingrix) 06/23/2020, 08/22/2020    Health Maintenance  Topic Date Due   FOOT EXAM  04/30/2022  (Originally 12/11/2021)   COVID-19 Vaccine (7 - 2023-24 season) 05/15/2022 (Originally 11/09/2021)   HEMOGLOBIN A1C  06/18/2022   OPHTHALMOLOGY EXAM  07/12/2022   Medicare Annual Wellness (AWV)  04/30/2023   DTaP/Tdap/Td (4 - Td or Tdap) 10/09/2025   Pneumonia Vaccine 93+ Years old  Completed   INFLUENZA VACCINE  Completed   Zoster Vaccines- Shingrix  Completed   HPV VACCINES  Aged Out       Assessment  This is a routine wellness examination for Ryan Bernard.  Health Maintenance: Due or Overdue There are no preventive care reminders to display for this patient.   Ryan Bernard does not need a referral for [employer] Assistance: Care Management:   no Social Work:    no Prescription Assistance:  no Nutrition/Diabetes Education:  no   Plan:  Personalized Goals  Goals Addressed               This Visit's Progress     Patient Stated (pt-stated)        Patient stated that he would like able to increase the walking more on daily basis.       Personalized Health Maintenance & Screening Recommendations  Foot exam  Lung Cancer Screening Recommended: no (Low Dose CT Chest recommended if Age 55-80 years, 30 pack-year currently smoking OR have quit w/in past 15 years) Hepatitis C Screening recommended: no HIV Screening recommended: no  Advanced Directives: Written information was not prepared  per patient's request.  Referrals & Orders No orders of the defined types were placed in this encounter.   Follow-up Plan Follow-up with Luetta Nutting, DO as planned Foot exam can be done at the next visit. Medicare wellness visit in one year. Patient will access AVS on my chart.   I have personally reviewed and noted the following in the patient's chart:   Medical and social history Use of alcohol, tobacco or illicit drugs  Current medications and supplements Functional ability and status Nutritional status Physical activity Advanced directives List of other  physicians Hospitalizations, surgeries, and ER visits in previous 12 months Vitals Screenings to include cognitive, depression, and falls Referrals and appointments  In addition, I have reviewed and discussed with Ryan Bernard certain preventive protocols, quality metrics, and best practice recommendations. A written personalized care plan for preventive services as well as general preventive health recommendations is available and can be mailed to the patient at his request.      Tinnie Gens, RN BSN  04/29/2022

## 2022-04-29 NOTE — Patient Instructions (Signed)
[facility] Summary and Written Plan of Care  Mr. Ryan Bernard ,  Thank you for allowing me to perform your Medicare Annual Wellness Visit and for your ongoing commitment to your health.   Health Ryan Bernard & Immunization History Health Ryan Bernard  Topic Date Due   FOOT EXAM  04/30/2022 (Originally 12/11/2021)   COVID-19 Vaccine (7 - 2023-24 season) 05/15/2022 (Originally 11/09/2021)   HEMOGLOBIN A1C  06/18/2022   OPHTHALMOLOGY EXAM  07/12/2022   Medicare Annual Wellness (AWV)  04/30/2023   DTaP/Tdap/Td (4 - Td or Tdap) 10/09/2025   Pneumonia Vaccine 27+ Years old  Completed   INFLUENZA VACCINE  Completed   Zoster Vaccines- Shingrix  Completed   HPV VACCINES  Aged Out   Immunization History  Administered Date(s) Administered   Fluad Quad(high Dose 65+) 11/02/2018, 12/21/2019, 12/11/2020, 12/17/2021   Influenza Split 12/24/2011, 12/09/2012, 12/09/2013   Influenza Whole 01/09/2009, 12/20/2009, 12/28/2010   Influenza, High Dose Seasonal PF 12/09/2009, 01/10/2011, 12/10/2011, 11/02/2015, 10/09/2016, 10/30/2016, 12/01/2017   Influenza, Seasonal, Injecte, Preservative Fre 11/06/2012, 12/14/2013, 01/10/2015   Influenza,inj,Quad PF,6+ Mos 11/06/2012, 12/14/2013, 01/10/2015   Influenza-Unspecified 12/09/2000, 12/09/2001, 12/10/2002, 01/16/2004, 01/09/2005, 01/09/2006, 01/10/2007, 11/24/2007, 01/09/2009, 11/02/2015, 10/30/2016, 12/01/2017, 12/09/2018, 12/21/2019, 12/18/2021   Moderna Sars-Covid-2 Vaccination 04/23/2019, 05/21/2019, 09/20/2019, 03/13/2020   PFIZER(Purple Top)SARS-COV-2 Vaccination 05/04/2019, 05/25/2019   Pneumococcal Conjugate-13 03/16/2014   Pneumococcal Polysaccharide-23 08/20/2011   Pneumococcal-Unspecified 12/09/2000   Rsv, Bivalent, Protein Subunit Rsvpref,pf [place]) 03/28/2022   Td 12/10/2002   Tdap 10/10/2015   Tetanus 09/08/2009   Zoster Recombinat (Shingrix) 06/23/2020, 08/22/2020    These are the patient goals that we  discussed:  Goals Addressed               This Visit's Progress     Patient Stated (pt-stated)        Patient stated that he would like able to increase the walking more on daily basis.         This is a list of Health Ryan Bernard Items that are overdue or due now: Foot exam    Orders/Referrals Placed Today: No orders of the defined types were placed in this encounter.  (Contact our referral department at [phone] if you have not spoken with someone about your referral appointment within the next 5 days)    Follow-up Plan Follow-up with Luetta Nutting, DO as planned Foot exam can be done at the next visit. Medicare wellness visit in one year. Patient will access AVS on my chart.      Health Ryan Bernard, Male Adopting a healthy lifestyle and getting preventive care are important in promoting health and wellness. Ask your health care provider about: The right schedule for you to have regular tests and exams. Things you can do on your own to prevent diseases and keep yourself healthy. What should I know about diet, weight, and exercise? Eat a healthy diet  Eat a diet that includes plenty of vegetables, fruits, low-fat dairy products, and lean protein. Do not eat a lot of foods that are high in solid fats, added sugars, or sodium. Maintain a healthy weight Body mass index (BMI) is a measurement that can be used to identify possible weight problems. It estimates body fat based on height and weight. Your health care provider can help determine your BMI and help you achieve or maintain a healthy weight. Get regular exercise Get regular exercise. This is one of the most important things you can do for your health. Most adults should: Exercise for at least  150 minutes each week. The exercise should increase your heart rate and make you sweat (moderate-intensity exercise). Do strengthening exercises at least twice a week. This is in addition to the moderate-intensity  exercise. Spend less time sitting. Even light physical activity can be beneficial. Watch cholesterol and blood lipids Have your blood tested for lipids and cholesterol at 87 years of age, then have this test every 5 years. You may need to have your cholesterol levels checked more often if: Your lipid or cholesterol levels are high. You are older than 87 years of age. You are at high risk for heart disease. What should I know about cancer screening? Many types of cancers can be detected early and may often be prevented. Depending on your health history and family history, you may need to have cancer screening at various ages. This may include screening for: Colorectal cancer. Prostate cancer. Skin cancer. Lung cancer. What should I know about heart disease, diabetes, and high blood pressure? Blood pressure and heart disease High blood pressure causes heart disease and increases the risk of stroke. This is more likely to develop in people who have high blood pressure readings or are overweight. Talk with your health care provider about your target blood pressure readings. Have your blood pressure checked: Every 3-5 years if you are 26-42 years of age. Every year if you are 59 years old or older. If you are between the ages of 40 and 36 and are a current or former smoker, ask your health care provider if you should have a one-time screening for abdominal aortic aneurysm (AAA). Diabetes Have regular diabetes screenings. This checks your fasting blood sugar level. Have the screening done: Once every three years after age 6 if you are at a normal weight and have a low risk for diabetes. More often and at a younger age if you are overweight or have a high risk for diabetes. What should I know about preventing infection? Hepatitis B If you have a higher risk for hepatitis B, you should be screened for this virus. Talk with your health care provider to find out if you are at risk for hepatitis B  infection. Hepatitis C Blood testing is recommended for: Everyone born from 18 through 1963. Anyone with known risk factors for hepatitis C. Sexually transmitted infections (STIs) You should be screened each year for STIs, including gonorrhea and chlamydia, if: You are sexually active and are younger than 87 years of age. You are older than 87 years of age and your health care provider tells you that you are at risk for this type of infection. Your sexual activity has changed since you were last screened, and you are at increased risk for chlamydia or gonorrhea. Ask your health care provider if you are at risk. Ask your health care provider about whether you are at high risk for HIV. Your health care provider may recommend a prescription medicine to help prevent HIV infection. If you choose to take medicine to prevent HIV, you should first get tested for HIV. You should then be tested every 3 months for as long as you are taking the medicine. Follow these instructions at home: Alcohol use Do not drink alcohol if your health care provider tells you not to drink. If you drink alcohol: Limit how much you have to 0-2 drinks a day. Know how much alcohol is in your drink. In the [place] one drink equals one 12 oz bottle of beer (355 mL), one 5 oz glass of  wine (148 mL), or one 1 oz glass of hard liquor (44 mL). Lifestyle Do not use any products that contain nicotine or tobacco. These products include cigarettes, chewing tobacco, and vaping devices, such as e-cigarettes. If you need help quitting, ask your health care provider. Do not use street drugs. Do not share needles. Ask your health care provider for help if you need support or information about quitting drugs. General instructions Schedule regular health, dental, and eye exams. Stay current with your vaccines. Tell your health care provider if: You often feel depressed. You have ever been abused or do not feel safe at  home. Summary Adopting a healthy lifestyle and getting preventive care are important in promoting health and wellness. Follow your health care provider's instructions about healthy diet, exercising, and getting tested or screened for diseases. Follow your health care provider's instructions on monitoring your cholesterol and blood pressure. This information is not intended to replace advice given to you by your health care provider. Make sure you discuss any questions you have with your health care provider. Document Revised: 07/17/2020 Document Reviewed: 07/17/2020 Elsevier Patient Education  [place].

## 2022-06-18 ENCOUNTER — Ambulatory Visit (INDEPENDENT_AMBULATORY_CARE_PROVIDER_SITE_OTHER): Payer: Medicare PPO | Admitting: Family Medicine

## 2022-06-18 ENCOUNTER — Encounter: Payer: Self-pay | Admitting: Family Medicine

## 2022-06-18 VITALS — BP 136/68 | HR 82 | Ht 67.0 in | Wt 143.0 lb

## 2022-06-18 DIAGNOSIS — I10 Essential (primary) hypertension: Secondary | ICD-10-CM

## 2022-06-18 DIAGNOSIS — I4891 Unspecified atrial fibrillation: Secondary | ICD-10-CM | POA: Diagnosis not present

## 2022-06-18 DIAGNOSIS — E119 Type 2 diabetes mellitus without complications: Secondary | ICD-10-CM

## 2022-06-18 LAB — POCT GLYCOSYLATED HEMOGLOBIN (HGB A1C): HbA1c, POC (controlled diabetic range): 7 % (ref 0.0–7.0)

## 2022-06-18 NOTE — Assessment & Plan Note (Signed)
BP is well controlled with current medications.  Recommend continuation.   

## 2022-06-18 NOTE — Assessment & Plan Note (Signed)
Stable at this time.  Rate controlled with metoprolol.  Anticoagulated with Eliquis.

## 2022-06-18 NOTE — Progress Notes (Signed)
Ryan Bernard - 89 y.o. male MRN [id number]  Date of birth: 12-03-1931  Subjective Chief Complaint  Patient presents with   Diabetes   Hypertension    HPI Ryan Bernard is a 89 y.o. male here today for follow up visit.   He reports that he is feeling well.  Remains on metoprolol with aldactone and furosemide for history of HTN and A. Fib.  He does see cardiology regularly.  Has Moderate AS as well, denies symptoms from this.  No new chest pain, increasing dyspnea, or palpitations.   Blood sugars are a little higher today with A1c is 7.0%.  He is taking farxiga regularly.  He has not been as active but reports that he will become more active as the weather improves.    ROS:  A comprehensive ROS was completed and negative except as noted per HPI  No Known Allergies  Past Medical History:  Diagnosis Date   Anemia    Arthritis    BPH (benign prostatic hyperplasia)    CAD (coronary artery disease) 03/11/1993   w/ angioplasty   CHF (congestive heart failure)    Chronic kidney disease    Chronic obstructive pulmonary disease 12/17/2021   Chronic obstructive pulmonary disease 12/17/2021   Diabetes mellitus 03/11/2002   type 2   Heart disease    Mild valvular w/ pulm HTN   Heart murmur    Hyperlipidemia    Hypertension    SBE (subacute bacterial endocarditis) prophylaxis candidate    Well adult exam 12/17/2021    Past Surgical History:  Procedure Laterality Date   ANGIOPLASTY  03/11/1993   [facility]   BACK SURGERY  03/11/1962   CORONARY ARTERY BYPASS GRAFT  10/27/2009   5 vessel, Dr. Raoul Pitch   EYE SURGERY     HERNIA REPAIR     x 2     Social History   Socioeconomic History   Marital status: Widowed    Spouse name: Not on file   Number of children: 2   Years of education: 44   Highest education level: 12th grade  Occupational History   Occupation: retired    Comment: Insurance claims handler  Tobacco Use   Smoking status: Never   Smokeless tobacco: Never  Vaping  Use   Vaping Use: Never used  Substance and Sexual Activity   Alcohol use: No   Drug use: No   Sexual activity: Not Currently  Other Topics Concern   Not on file  Social History Narrative   Lives with his son. He has two children. Sits around house and watches TV and walks in the yard.   Social Determinants of Health   Financial Resource Strain: Low Risk  (04/28/2022)   Overall Financial Resource Strain (CARDIA)    Difficulty of Paying Living Expenses: Not hard at all  Food Insecurity: No Food Insecurity (04/28/2022)   Hunger Vital Sign    Worried About Running Out of Food in the Last Year: Never true    Ran Out of Food in the Last Year: Never true  Transportation Needs: No Transportation Needs (04/28/2022)   PRAPARE - Administrator, Civil Service (Medical): No    Lack of Transportation (Non-Medical): No  Physical Activity: Insufficiently Active (04/28/2022)   Exercise Vital Sign    Days of Exercise per Week: 3 days    Minutes of Exercise per Session: 40 min  Stress: No Stress Concern Present (04/28/2022)   [name] of Occupational Health - Occupational Stress  Questionnaire    Feeling of Stress : Not at all  Social Connections: Moderately Integrated (04/29/2022)   Social Connection and Isolation Panel [NHANES]    Frequency of Communication with Friends and Family: More than three times a week    Frequency of Social Gatherings with Friends and Family: More than three times a week    Attends Religious Services: 1 to 4 times per year    Active Member of [employer] or Organizations: Yes    Attends Banker Meetings: More than 4 times per year    Marital Status: Widowed    Family History  Problem Relation Age of Onset   Heart disease Mother    Hypertension Mother    Alcohol abuse Father    Depression Daughter    Depression Son     Health Maintenance  Topic Date Due   COVID-19 Vaccine (7 - 2023-24 season) 11/09/2021   OPHTHALMOLOGY EXAM  07/12/2022    INFLUENZA VACCINE  10/10/2022   HEMOGLOBIN A1C  12/18/2022   Medicare Annual Wellness (AWV)  04/30/2023   FOOT EXAM  06/18/2023   DTaP/Tdap/Td (4 - Td or Tdap) 10/09/2025   Pneumonia Vaccine 45+ Years old  Completed   Zoster Vaccines- Shingrix  Completed   HPV VACCINES  Aged Out     ----------------------------------------------------------------------------------------------------------------------------------------------------------------------------------------------------------------- Physical Exam BP 136/68 (BP Location: Left Arm, Patient Position: Sitting, Cuff Size: Normal)   Pulse 82   Ht 5\' 7"  (1.702 m)   Wt 143 lb (64.9 kg)   SpO2 92%   BMI 22.40 kg/m   Physical Exam Constitutional:      Appearance: Normal appearance.  HENT:     Head: Normocephalic and atraumatic.  Eyes:     General: No scleral icterus. Cardiovascular:     Rate and Rhythm: Normal rate and regular rhythm.  Pulmonary:     Effort: Pulmonary effort is normal.     Breath sounds: Normal breath sounds.  Musculoskeletal:     Cervical back: Neck supple.  Neurological:     Mental Status: He is alert.  Psychiatric:        Mood and Affect: Mood normal.        Behavior: Behavior normal.     ------------------------------------------------------------------------------------------------------------------------------------------------------------------------------------------------------------------- Assessment and Plan  Essential hypertension, benign BP is well controlled with current medications.  Recommend continuation.   Atrial fibrillation (HCC) Stable at this time.  Rate controlled with metoprolol.  Anticoagulated with Eliquis.  Controlled type 2 diabetes mellitus without complication (HCC) Diabetes control has worsened some.  He plans to increase his activity level over the next few months.    No orders of the defined types were placed in this encounter.   Return in about 6 months  (around 12/18/2022) for HTN.    This visit occurred during the SARS-CoV-2 public health emergency.  Safety protocols were in place, including screening questions prior to the visit, additional usage of staff PPE, and extensive cleaning of exam room while observing appropriate contact time as indicated for disinfecting solutions.

## 2022-06-18 NOTE — Assessment & Plan Note (Signed)
Diabetes control has worsened some.  He plans to increase his activity level over the next few months.

## 2022-08-16 DIAGNOSIS — I350 Nonrheumatic aortic (valve) stenosis: Secondary | ICD-10-CM | POA: Diagnosis not present

## 2022-08-16 DIAGNOSIS — Z1330 Encounter for screening examination for mental health and behavioral disorders, unspecified: Secondary | ICD-10-CM | POA: Diagnosis not present

## 2022-08-16 DIAGNOSIS — I10 Essential (primary) hypertension: Secondary | ICD-10-CM | POA: Diagnosis not present

## 2022-08-16 DIAGNOSIS — I255 Ischemic cardiomyopathy: Secondary | ICD-10-CM | POA: Diagnosis not present

## 2022-08-16 DIAGNOSIS — I482 Chronic atrial fibrillation, unspecified: Secondary | ICD-10-CM | POA: Diagnosis not present

## 2022-12-18 ENCOUNTER — Ambulatory Visit (INDEPENDENT_AMBULATORY_CARE_PROVIDER_SITE_OTHER): Payer: Medicare PPO | Admitting: Family Medicine

## 2022-12-18 ENCOUNTER — Encounter: Payer: Self-pay | Admitting: Family Medicine

## 2022-12-18 VITALS — BP 156/55 | HR 77 | Ht 67.0 in | Wt 136.7 lb

## 2022-12-18 DIAGNOSIS — R350 Frequency of micturition: Secondary | ICD-10-CM

## 2022-12-18 DIAGNOSIS — Z23 Encounter for immunization: Secondary | ICD-10-CM

## 2022-12-18 DIAGNOSIS — N183 Chronic kidney disease, stage 3 unspecified: Secondary | ICD-10-CM

## 2022-12-18 DIAGNOSIS — E785 Hyperlipidemia, unspecified: Secondary | ICD-10-CM

## 2022-12-18 DIAGNOSIS — I10 Essential (primary) hypertension: Secondary | ICD-10-CM

## 2022-12-18 DIAGNOSIS — N401 Enlarged prostate with lower urinary tract symptoms: Secondary | ICD-10-CM

## 2022-12-18 DIAGNOSIS — E119 Type 2 diabetes mellitus without complications: Secondary | ICD-10-CM | POA: Diagnosis not present

## 2022-12-18 DIAGNOSIS — I4891 Unspecified atrial fibrillation: Secondary | ICD-10-CM

## 2022-12-18 DIAGNOSIS — R7989 Other specified abnormal findings of blood chemistry: Secondary | ICD-10-CM

## 2022-12-18 LAB — POCT GLYCOSYLATED HEMOGLOBIN (HGB A1C): HbA1c, POC (controlled diabetic range): 6.4 % (ref 0.0–7.0)

## 2022-12-18 NOTE — Assessment & Plan Note (Signed)
Stable with hytrin, continue at current strength.  ?

## 2022-12-18 NOTE — Assessment & Plan Note (Signed)
Stable at this time.  Rate controlled with metoprolol.  Anticoagulated with Eliquis.

## 2022-12-18 NOTE — Assessment & Plan Note (Signed)
Blood sugars are better controlled.  He will continue [place] at current strength.

## 2022-12-18 NOTE — Assessment & Plan Note (Signed)
Continue pravastatin at current strength.  Lab Results  Component Value Date   LDLCALC 111 (H) 12/17/2021

## 2022-12-18 NOTE — Progress Notes (Signed)
Ryan Bernard - 89 y.o. male MRN [id number]  Date of birth: 1931/08/01  Subjective Chief Complaint  Patient presents with   Immunizations   Hypertension    HPI Ryan Bernard  ia  89 y.o. male here today for follow-up visit.  He reports that he is doing well.  Continues on Farxiga for management of his diabetes.  He is tolerating this well at current strength.  A1c improved today to 6.4%.  Remains on metoprolol history of atrial fibrillation and hypertension.  He has prescribed Aldactone as well however stopped taking this due to hypotension.  Since stopping this his blood pressure starting to trend back up.  He denies chest pain, shortness of breath, palpitations, headaches or vision changes.  Remains anticoagulated with Eliquis.  Tolerating well.   ROS:  A comprehensive ROS was completed and negative except as noted per HPI   No Known Allergies  Past Medical History:  Diagnosis Date   Anemia    Arthritis    BPH (benign prostatic hyperplasia)    CAD (coronary artery disease) 03/11/1993   w/ angioplasty   CHF (congestive heart failure) (HCC)    Chronic kidney disease    Chronic obstructive pulmonary disease (HCC) 12/17/2021   Chronic obstructive pulmonary disease (HCC) 12/17/2021   Diabetes mellitus 03/11/2002   type 2   Heart disease    Mild valvular w/ pulm HTN   Heart murmur    Hyperlipidemia    Hypertension    SBE (subacute bacterial endocarditis) prophylaxis candidate    Well adult exam 12/17/2021    Past Surgical History:  Procedure Laterality Date   ANGIOPLASTY  03/11/1993   [facility]   BACK SURGERY  03/11/1962   CORONARY ARTERY BYPASS GRAFT  10/27/2009   5 vessel, Dr. Raoul Pitch   EYE SURGERY     HERNIA REPAIR     x 2     Social History   Socioeconomic History   Marital status: Widowed    Spouse name: Not on file   Number of children: 2   Years of education: 34   Highest education level: 12th grade  Occupational History   Occupation:  retired    Comment: Insurance claims handler  Tobacco Use   Smoking status: Never   Smokeless tobacco: Never  Vaping Use   Vaping status: Never Used  Substance and Sexual Activity   Alcohol use: No   Drug use: No   Sexual activity: Not Currently  Other Topics Concern   Not on file  Social History Narrative   Lives with his son. He has two children. Sits around house and watches TV and walks in the yard.   Social Determinants of Health   Financial Resource Strain: Low Risk  (04/28/2022)   Overall Financial Resource Strain (CARDIA)    Difficulty of Paying Living Expenses: Not hard at all  Food Insecurity: No Food Insecurity (04/28/2022)   Hunger Vital Sign    Worried About Running Out of Food in the Last Year: Never true    Ran Out of Food in the Last Year: Never true  Transportation Needs: No Transportation Needs (04/28/2022)   PRAPARE - Administrator, Civil Service (Medical): No    Lack of Transportation (Non-Medical): No  Physical Activity: Insufficiently Active (04/28/2022)   Exercise Vital Sign    Days of Exercise per Week: 3 days    Minutes of Exercise per Session: 40 min  Stress: No Stress Concern Present (04/28/2022)   [name] of  Occupational Health - Occupational Stress Questionnaire    Feeling of Stress : Not at all  Social Connections: Moderately Integrated (04/29/2022)   Social Connection and Isolation Panel [NHANES]    Frequency of Communication with Friends and Family: More than three times a week    Frequency of Social Gatherings with Friends and Family: More than three times a week    Attends Religious Services: 1 to 4 times per year    Active Member of [employer] or Organizations: Yes    Attends Banker Meetings: More than 4 times per year    Marital Status: Widowed    Family History  Problem Relation Age of Onset   Heart disease Mother    Hypertension Mother    Alcohol abuse Father    Depression Daughter    Depression Son     Health  Maintenance  Topic Date Due   OPHTHALMOLOGY EXAM  07/12/2022   COVID-19 Vaccine (8 - 2023-24 season) 02/12/2023   Medicare Annual Wellness (AWV)  04/30/2023   FOOT EXAM  06/18/2023   HEMOGLOBIN A1C  06/18/2023   DTaP/Tdap/Td (4 - Td or Tdap) 10/09/2025   Pneumonia Vaccine 51+ Years old  Completed   INFLUENZA VACCINE  Completed   Zoster Vaccines- Shingrix  Completed   HPV VACCINES  Aged Out     ----------------------------------------------------------------------------------------------------------------------------------------------------------------------------------------------------------------- Physical Exam BP (!) 156/55 (BP Location: Left Arm, Patient Position: Sitting, Cuff Size: Small)   Pulse 77   Ht 5\' 7"  (1.702 m)   Wt 136 lb 11.2 oz (62 kg)   SpO2 93%   BMI 21.41 kg/m   Physical Exam Constitutional:      Appearance: Normal appearance.  HENT:     Head: Normocephalic and atraumatic.  Eyes:     General: No scleral icterus. Cardiovascular:     Rate and Rhythm: Normal rate and regular rhythm.  Pulmonary:     Effort: Pulmonary effort is normal.     Breath sounds: Normal breath sounds.  Musculoskeletal:     Cervical back: Neck supple.  Neurological:     Mental Status: He is alert.  Psychiatric:        Mood and Affect: Mood normal.        Behavior: Behavior normal.     ------------------------------------------------------------------------------------------------------------------------------------------------------------------------------------------------------------------- Assessment and Plan  Essential hypertension, benign Blood pressure is slightly elevated.  Will add Aldactone back on but only take every other day as he was having hypotension when taking previously.  Continue monitoring blood pressure at home.  Atrial fibrillation (HCC) Stable at this time.  Rate controlled with metoprolol.  Anticoagulated with Eliquis.  Controlled type 2 diabetes  mellitus without complication (HCC) Blood sugars are better controlled.  He will continue [place] at current strength.  BPH (benign prostatic hyperplasia) Stable with hytrin, continue at current strength.   Dyslipidemia Continue pravastatin at current strength.  Lab Results  Component Value Date   LDLCALC 111 (H) 12/17/2021     No orders of the defined types were placed in this encounter.   Return in about 6 months (around 06/18/2023) for Hypertension, Type 2 Diabetes.    This visit occurred during the SARS-CoV-2 public health emergency.  Safety protocols were in place, including screening questions prior to the visit, additional usage of staff PPE, and extensive cleaning of exam room while observing appropriate contact time as indicated for disinfecting solutions.

## 2022-12-18 NOTE — Assessment & Plan Note (Signed)
Blood pressure is slightly elevated.  Will add Aldactone back on but only take every other day as he was having hypotension when taking previously.  Continue monitoring blood pressure at home.

## 2022-12-19 LAB — LIPID PANEL WITH LDL/HDL RATIO
Cholesterol, Total: 137 mg/dL (ref 100–199)
HDL: 44 mg/dL (ref >39)
LDL Chol Calc (NIH): 79 mg/dL (ref 0–99)
LDL/HDL Ratio: 1.8 {ratio} (ref 0.0–3.6)
Triglycerides: 67 mg/dL (ref 0–149)
VLDL Cholesterol Cal: 14 mg/dL (ref 5–40)

## 2022-12-19 LAB — CBC WITH DIFFERENTIAL/PLATELET
Basophils Absolute: 0 10*3/uL (ref 0.0–0.2)
Basos: 0 %
EOS (ABSOLUTE): 0.1 10*3/uL (ref 0.0–0.4)
Eos: 2 %
Hematocrit: 45.2 % (ref 37.5–51.0)
Hemoglobin: 15.2 g/dL (ref 13.0–17.7)
Immature Grans (Abs): 0 10*3/uL (ref 0.0–0.1)
Immature Granulocytes: 0 %
Lymphocytes Absolute: 1.3 10*3/uL (ref 0.7–3.1)
Lymphs: 24 %
MCH: 32.1 pg (ref 26.6–33.0)
MCHC: 33.6 g/dL (ref 31.5–35.7)
MCV: 96 fL (ref 79–97)
Monocytes Absolute: 0.4 10*3/uL (ref 0.1–0.9)
Monocytes: 7 %
Neutrophils Absolute: 3.7 10*3/uL (ref 1.4–7.0)
Neutrophils: 67 %
Platelets: 136 10*3/uL — ABNORMAL LOW (ref 150–450)
RBC: 4.73 x10E6/uL (ref 4.14–5.80)
RDW: 12.6 % (ref 11.6–15.4)
WBC: 5.5 10*3/uL (ref 3.4–10.8)

## 2022-12-19 LAB — CMP14+EGFR
ALT: 11 [IU]/L (ref 0–44)
AST: 14 [IU]/L (ref 0–40)
Albumin: 4 g/dL (ref 3.7–4.7)
Alkaline Phosphatase: 93 [IU]/L (ref 44–121)
BUN/Creatinine Ratio: 12 (ref 10–24)
BUN: 15 mg/dL (ref 8–27)
Bilirubin Total: 0.8 mg/dL (ref 0.0–1.2)
CO2: 25 mmol/L (ref 20–29)
Calcium: 9.6 mg/dL (ref 8.6–10.2)
Chloride: 102 mmol/L (ref 96–106)
Creatinine, Ser: 1.22 mg/dL (ref 0.76–1.27)
Globulin, Total: 3.2 g/dL (ref 1.5–4.5)
Glucose: 112 mg/dL — ABNORMAL HIGH (ref 70–99)
Potassium: 4.4 mmol/L (ref 3.5–5.2)
Sodium: 141 mmol/L (ref 134–144)
Total Protein: 7.2 g/dL (ref 6.0–8.5)
eGFR: 57 mL/min/{1.73_m2} — ABNORMAL LOW (ref >59)

## 2022-12-19 LAB — TSH+FREE T4
Free T4: 1.27 ng/dL (ref 0.82–1.77)
TSH: 9.9 u[IU]/mL — ABNORMAL HIGH (ref 0.450–4.500)

## 2022-12-19 LAB — MICROALBUMIN / CREATININE URINE RATIO
Creatinine, Urine: 131.3 mg/dL
Microalb/Creat Ratio: 266 mg/g{creat} — ABNORMAL HIGH (ref 0–29)
Microalbumin, Urine: 349.4 ug/mL

## 2023-02-18 DIAGNOSIS — I350 Nonrheumatic aortic (valve) stenosis: Secondary | ICD-10-CM | POA: Diagnosis not present

## 2023-02-18 DIAGNOSIS — I255 Ischemic cardiomyopathy: Secondary | ICD-10-CM | POA: Diagnosis not present

## 2023-02-18 DIAGNOSIS — I482 Chronic atrial fibrillation, unspecified: Secondary | ICD-10-CM | POA: Diagnosis not present

## 2023-02-18 DIAGNOSIS — I10 Essential (primary) hypertension: Secondary | ICD-10-CM | POA: Diagnosis not present

## 2023-04-24 ENCOUNTER — Ambulatory Visit
Admission: EM | Admit: 2023-04-24 | Discharge: 2023-04-24 | Disposition: A | Discharge status: Home / Self Care | Payer: Medicare PPO | Attending: Family Medicine | Admitting: Family Medicine

## 2023-04-24 ENCOUNTER — Ambulatory Visit: Payer: Medicare PPO

## 2023-04-24 DIAGNOSIS — R059 Cough, unspecified: Secondary | ICD-10-CM | POA: Diagnosis not present

## 2023-04-24 DIAGNOSIS — U071 COVID-19: Secondary | ICD-10-CM | POA: Diagnosis not present

## 2023-04-24 DIAGNOSIS — I130 Hypertensive heart and chronic kidney disease with heart failure and stage 1 through stage 4 chronic kidney disease, or unspecified chronic kidney disease: Secondary | ICD-10-CM | POA: Diagnosis not present

## 2023-04-24 DIAGNOSIS — R0902 Hypoxemia: Secondary | ICD-10-CM | POA: Diagnosis not present

## 2023-04-24 DIAGNOSIS — I447 Left bundle-branch block, unspecified: Secondary | ICD-10-CM | POA: Diagnosis not present

## 2023-04-24 DIAGNOSIS — I2489 Other forms of acute ischemic heart disease: Secondary | ICD-10-CM | POA: Diagnosis not present

## 2023-04-24 DIAGNOSIS — I255 Ischemic cardiomyopathy: Secondary | ICD-10-CM | POA: Diagnosis not present

## 2023-04-24 DIAGNOSIS — I5023 Acute on chronic systolic (congestive) heart failure: Secondary | ICD-10-CM | POA: Diagnosis not present

## 2023-04-24 DIAGNOSIS — Z951 Presence of aortocoronary bypass graft: Secondary | ICD-10-CM | POA: Diagnosis not present

## 2023-04-24 DIAGNOSIS — I083 Combined rheumatic disorders of mitral, aortic and tricuspid valves: Secondary | ICD-10-CM | POA: Diagnosis not present

## 2023-04-24 DIAGNOSIS — I509 Heart failure, unspecified: Secondary | ICD-10-CM

## 2023-04-24 DIAGNOSIS — R051 Acute cough: Secondary | ICD-10-CM

## 2023-04-24 DIAGNOSIS — J90 Pleural effusion, not elsewhere classified: Secondary | ICD-10-CM | POA: Diagnosis not present

## 2023-04-24 DIAGNOSIS — I482 Chronic atrial fibrillation, unspecified: Secondary | ICD-10-CM | POA: Diagnosis not present

## 2023-04-24 DIAGNOSIS — J9601 Acute respiratory failure with hypoxia: Secondary | ICD-10-CM | POA: Diagnosis not present

## 2023-04-24 DIAGNOSIS — D696 Thrombocytopenia, unspecified: Secondary | ICD-10-CM | POA: Diagnosis not present

## 2023-04-24 DIAGNOSIS — R9389 Abnormal findings on diagnostic imaging of other specified body structures: Secondary | ICD-10-CM | POA: Diagnosis not present

## 2023-04-24 DIAGNOSIS — R918 Other nonspecific abnormal finding of lung field: Secondary | ICD-10-CM | POA: Diagnosis not present

## 2023-04-24 NOTE — ED Provider Notes (Signed)
 [facility]    CSN: [id number] Arrival date & time: 04/24/23  1008      History   Chief Complaint Chief Complaint  Patient presents with   Cough    HPI Ryan Bernard is a 89 y.o. male.    Cough Here for cough has been going on for at least 2 weeks, possibly more.  The cough is productive of some thick mucus.  Yesterday he started feeling weaker and tired.  No fever  He apparently does have a history of COPD and has used inhalers in the past.  Past medical history is also significant for heart disease and A-fib, and congestive heart failure and chronic kidney disease (EGFR is in the 50s most times when checked in the last year.Marland Kitchen  He also has diabetes; last A1c was 6.4 in October.  Past Medical History:  Diagnosis Date   Anemia    Arthritis    BPH (benign prostatic hyperplasia)    CAD (coronary artery disease) 03/11/1993   w/ angioplasty   CHF (congestive heart failure) (HCC)    Chronic kidney disease    Chronic obstructive pulmonary disease (HCC) 12/17/2021   Chronic obstructive pulmonary disease (HCC) 12/17/2021   Diabetes mellitus 03/11/2002   type 2   Heart disease    Mild valvular w/ pulm HTN   Heart murmur    Hyperlipidemia    Hypertension    SBE (subacute bacterial endocarditis) prophylaxis candidate    Well adult exam 12/17/2021    Patient Active Problem List   Diagnosis Date Noted   Chronic obstructive pulmonary disease (HCC) 12/17/2021   Well adult exam 12/17/2021   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   PAD (peripheral artery disease) (HCC) 01/11/2021   Fall 11/08/2020   Near syncope 11/08/2020   Moderate aortic stenosis 11/08/2020   At high risk for falls 11/08/2020   Shortness of breath 09/27/2020   Atrial fibrillation (HCC) 07/02/2018   Subclinical hypothyroidism 03/06/2018   Mitral valve insufficiency 10/03/2017   ACC/AHA stage C heart failure with reduced ejection fraction (HCC) 09/08/2017   Hypokalemia 05/26/2017   Hypomagnesemia  05/26/2017   Prediabetes 09/19/2014   Postherpetic neuralgia 06/10/2013   Chronic kidney disease, stage 3 (HCC) 05/06/2012   Hyperlipidemia with target low density lipoprotein (LDL) cholesterol less than 70 mg/dL 78/29/5621   S/P CABG x 5 05/05/2012   Controlled type 2 diabetes mellitus without complication (HCC) 01/29/2010   Dyslipidemia 01/29/2010   MICROALBUMINURIA 08/07/2007   PULMONARY HYPERTENSION 04/07/2007   Essential hypertension, benign 03/13/2007   Coronary atherosclerosis 03/13/2007   SOLAR KERATOSIS 03/13/2007   BPH (benign prostatic hyperplasia) 03/13/2007    Past Surgical History:  Procedure Laterality Date   ANGIOPLASTY  03/11/1993   [facility]   BACK SURGERY  03/11/1962   CORONARY ARTERY BYPASS GRAFT  10/27/2009   5 vessel, Dr. Raoul Pitch   EYE SURGERY     HERNIA REPAIR     x 2        Home Medications    Prior to Admission medications   Medication Sig Start Date End Date Taking? Authorizing Provider  albuterol (PROVENTIL HFA;VENTOLIN HFA) 108 (90 Base) MCG/ACT inhaler Inhale 2 puffs into the lungs every 6 (six) hours as needed for wheezing. 05/22/17   Sunnie Nielsen, DO  apixaban (ELIQUIS) 5 MG TABS tablet Take 5 mg by mouth 2 (two) times daily.    [provider]  dapagliflozin propanediol (FARXIGA) 10 MG TABS tablet Take 1 tablet by mouth daily. 10/12/21  [provider]  furosemide (LASIX) 20 MG tablet Take 1 tablet (20 mg total) by mouth daily. 08/25/19   Sunnie Nielsen, DO  metoprolol succinate (TOPROL-XL) 100 MG 24 hr tablet Take 100 mg by mouth daily. Take with or immediately following a meal.    [provider]  pravastatin (PRAVACHOL) 80 MG tablet Take 1 tablet (80 mg total) by mouth at bedtime. 06/19/21   Everrett Coombe, DO  spironolactone (ALDACTONE) 25 MG tablet Take 1 tablet by mouth daily. 10/06/20   [provider]  terazosin (HYTRIN) 10 MG capsule Take 10 mg by mouth daily.      [provider]    Family History Family History  Problem Relation Age of Onset   Heart disease Mother    Hypertension Mother    Alcohol abuse Father    Depression Daughter    Depression Son     Social History Social History   Tobacco Use   Smoking status: Never   Smokeless tobacco: Never  Vaping Use   Vaping status: Never Used  Substance Use Topics   Alcohol use: No   Drug use: No     Allergies   Patient has no known allergies.   Review of Systems Review of Systems  Respiratory:  Positive for cough.      Physical Exam Triage Vital Signs ED Triage Vitals  Encounter Vitals Group     BP 04/24/23 1022 130/73     Systolic BP Percentile --      Diastolic BP Percentile --      Pulse Rate 04/24/23 1022 89     Resp --      Temp 04/24/23 1022 97.8 F (36.6 C)     Temp src --      SpO2 04/24/23 1022 (!) 88 %     Weight --      Height --      Head Circumference --      Peak Flow --      Pain Score 04/24/23 1021 0     Pain Loc --      Pain Education --      Exclude from Growth Chart --    No data found.  Updated Vital Signs BP 130/73   Pulse 89   Temp 97.8 F (36.6 C)   SpO2 (!) 88%   Visual Acuity Right Eye Distance:   Left Eye Distance:   Bilateral Distance:    Right Eye Near:   Left Eye Near:    Bilateral Near:     Physical Exam Vitals and nursing note reviewed.  Constitutional:      Comments: Initially on room air his oxygen saturation was 83% and then with rapidly to 88% once he was sitting and resting.  Once I entered the room his O2 sat on room air remained at 92% resting.  He was able to talk in complete sentences without break.  His heart rate while I was in the room very in the mid 90s to 107.  Mostly it was in the 90s.  No cyanosis noted  HENT:     Mouth/Throat:     Mouth: Mucous membranes are moist.     Pharynx: No oropharyngeal exudate or posterior oropharyngeal erythema.  Eyes:     Extraocular Movements: Extraocular movements intact.      Conjunctiva/sclera: Conjunctivae normal.     Pupils: Pupils are equal, round, and reactive to light.  Cardiovascular:     Comments: Heart rate is  in the 90s with an irregularly irregular rhythm. Pulmonary:     Effort: No respiratory distress.     Breath sounds: No stridor. No wheezing, rhonchi or rales.  Musculoskeletal:     Cervical back: Neck supple.  Lymphadenopathy:     Cervical: No cervical adenopathy.  Skin:    Capillary Refill: Capillary refill takes less than 2 seconds.     Coloration: Skin is not jaundiced or pale.  Neurological:     General: No focal deficit present.     Mental Status: He is oriented to person, place, and time.  Psychiatric:        Behavior: Behavior normal.      UC Treatments / Results  Labs (all labs ordered are listed, but only abnormal results are displayed) Labs Reviewed - No data to display  EKG   Radiology No results found.  Procedures Procedures (including critical care time)  Medications Ordered in UC Medications - No data to display  Initial Impression / Assessment and Plan / UC Course  I have reviewed the triage vital signs and the nursing notes.  Pertinent labs & imaging results that were available during my care of the patient were reviewed by me and considered in my medical decision making (see chart for details).     By my review there are increased interstitial markings, possibly consistent with heart failure exacerbation.  I have asked him to proceed to the emergency room for furtherurgent evaluation that we cannot provide here in the urgent care setting. Final Clinical Impressions(s) / UC Diagnoses   Final diagnoses:  Acute cough  Acute on chronic congestive heart failure, unspecified heart failure type [facility])     Discharge Instructions      Please go to the emergency room for further evaluation.  By my review there is increased fluid in your lungs on both sides.     ED Prescriptions   None    PDMP not  reviewed this encounter.   Zenia Resides, MD 04/24/23 1125

## 2023-04-24 NOTE — Discharge Instructions (Signed)
Please go to the emergency room for further evaluation.  By my review there is increased fluid in your lungs on both sides.

## 2023-04-24 NOTE — ED Triage Notes (Signed)
Pt presents to uc with co of thick productive cough for 2 weeks. New onset of feeling weak and fatigued starting yesterday. Pt has taken motrin and cough drops.

## 2023-04-25 ENCOUNTER — Ambulatory Visit: Payer: Medicare PPO | Admitting: Physician Assistant

## 2023-04-29 ENCOUNTER — Telehealth: Payer: Self-pay | Admitting: *Deleted

## 2023-04-29 NOTE — Transitions of Care (Post Inpatient/ED Visit) (Signed)
   04/29/2023  Name: Ryan Bernard MRN: [id number] DOB: 1931-03-19  Today's TOC FU Call Status: Today's TOC FU Call Status:: Unsuccessful Call (1st Attempt) Unsuccessful Call (1st Attempt) Date: 04/29/23  Attempted to reach the patient regarding the most recent Inpatient visit  Spoke with male person who reported that patient is currently resting and not available: verified no [facility] DPR on file; will re-attempt call for [facility] again tomorrow   Follow Up Plan: Additional outreach attempts will be made to reach the patient to complete the Transitions of Care (Post Inpatient visit) call.   Caryl Pina, RN, BSN, Media planner  Transitions of Care  [facility] - [facility] [phone]: direct office

## 2023-04-30 ENCOUNTER — Telehealth: Payer: Self-pay | Admitting: *Deleted

## 2023-04-30 NOTE — Transitions of Care (Post Inpatient/ED Visit) (Signed)
   04/30/2023  Name: Ryan Bernard MRN: [id number] DOB: 1931/07/08  Today's TOC FU Call Status: Today's TOC FU Call Status:: Unsuccessful Call (2nd Attempt) Unsuccessful Call (2nd Attempt) Date: 04/30/23  Attempted to reach the patient regarding the most recent Inpatient/ED visit.  Follow Up Plan: Additional outreach attempts will be made to reach the patient to complete the Transitions of Care (Post Inpatient/ED visit) call.   Gean Maidens BSN RN [facility] Health Care Management Coordinator [email or web address] Direct Dial: [phone]  Fax: [phone] Website: [email or web address]

## 2023-05-01 ENCOUNTER — Telehealth: Payer: Self-pay

## 2023-05-01 NOTE — Transitions of Care (Post Inpatient/ED Visit) (Signed)
   05/01/2023  Name: Ryan Bernard MRN: [id number] DOB: [birth year]  Today's TOC FU Call Status: Today's TOC FU Call Status:: Unsuccessful Call (3rd Attempt) Unsuccessful Call (3rd Attempt) Date: 05/01/23  Attempted to reach the patient regarding the most recent Inpatient/ED visit. Left confidential vo on personal phone number listed in contacts. Noted to have a PCP appointment on 05/05/23.   Follow Up Plan: No further outreach attempts will be made at this time. We have been unable to contact the patient.   Gabriel Cirri MSN, RN RN Case Sales executive Health  [facility]-Population Health Office Hours Wed/Thur  8:00 am-6:00 pm Direct Dial: [phone] Main Phone [phone]  Fax: [phone] [email or web address]

## 2023-05-05 DIAGNOSIS — I482 Chronic atrial fibrillation, unspecified: Secondary | ICD-10-CM | POA: Diagnosis not present

## 2023-05-05 DIAGNOSIS — I2510 Atherosclerotic heart disease of native coronary artery without angina pectoris: Secondary | ICD-10-CM | POA: Diagnosis not present

## 2023-05-05 DIAGNOSIS — I10 Essential (primary) hypertension: Secondary | ICD-10-CM | POA: Diagnosis not present

## 2023-05-05 DIAGNOSIS — I5022 Chronic systolic (congestive) heart failure: Secondary | ICD-10-CM | POA: Diagnosis not present

## 2023-05-05 DIAGNOSIS — Z951 Presence of aortocoronary bypass graft: Secondary | ICD-10-CM | POA: Diagnosis not present

## 2023-05-05 DIAGNOSIS — I255 Ischemic cardiomyopathy: Secondary | ICD-10-CM | POA: Diagnosis not present

## 2023-05-05 DIAGNOSIS — Z1330 Encounter for screening examination for mental health and behavioral disorders, unspecified: Secondary | ICD-10-CM | POA: Diagnosis not present

## 2023-05-05 DIAGNOSIS — E785 Hyperlipidemia, unspecified: Secondary | ICD-10-CM | POA: Diagnosis not present

## 2023-05-08 ENCOUNTER — Encounter: Payer: Self-pay | Admitting: Family Medicine

## 2023-05-08 ENCOUNTER — Ambulatory Visit (INDEPENDENT_AMBULATORY_CARE_PROVIDER_SITE_OTHER): Payer: Medicare PPO | Admitting: Family Medicine

## 2023-05-08 VITALS — BP 117/75 | HR 96 | Ht 67.0 in | Wt 127.2 lb

## 2023-05-08 DIAGNOSIS — I509 Heart failure, unspecified: Secondary | ICD-10-CM | POA: Diagnosis not present

## 2023-05-08 DIAGNOSIS — N183 Chronic kidney disease, stage 3 unspecified: Secondary | ICD-10-CM

## 2023-05-08 DIAGNOSIS — U071 COVID-19: Secondary | ICD-10-CM | POA: Insufficient documentation

## 2023-05-08 NOTE — Assessment & Plan Note (Signed)
 He has recovered well from this.  Red flags reviewed.

## 2023-05-08 NOTE — Assessment & Plan Note (Signed)
 He has done well with diuresis.  Continue lasix prn.  Update renal function/electrolytes.

## 2023-05-08 NOTE — Progress Notes (Signed)
 Ryan Bernard - 89 y.o. male MRN [id number]  Date of birth: [birth year]  Subjective Chief Complaint  Patient presents with   Hospitalization Follow-up    Pt was hospitalized for covid    HPI Ryan Bernard is a 89 y.o. male here today for hospital follow up.  He was recently hospitalized for acute hypoxemic respiratory failure due to COVID as well as acute on chronic CHF.  He was treated with remdesivir and decadron.  He reports that he is feeling much better.   CHF managed with IV lasix.  Echo relatively unchanged.  Transitioned to PO lasix at discharge.  Continued on Toprol and aldactone. He has had f/u with cardiology on 2/24.  Lasix continued as needed at cardiology appt.  He has not had f/u renal function since discharge.   ROS:  A comprehensive ROS was completed and negative except as noted per HPI  No Known Allergies  Past Medical History:  Diagnosis Date   Anemia    Arthritis    BPH (benign prostatic hyperplasia)    CAD (coronary artery disease) 03/11/1993   w/ angioplasty   CHF (congestive heart failure) (HCC)    Chronic kidney disease    Chronic obstructive pulmonary disease (HCC) 12/17/2021   Chronic obstructive pulmonary disease (HCC) 12/17/2021   Diabetes mellitus 03/11/2002   type 2   Heart disease    Mild valvular w/ pulm HTN   Heart murmur    Hyperlipidemia    Hypertension    SBE (subacute bacterial endocarditis) prophylaxis candidate    Well adult exam 12/17/2021    Past Surgical History:  Procedure Laterality Date   ANGIOPLASTY  03/11/1993   [facility]   BACK SURGERY  03/11/1962   CORONARY ARTERY BYPASS GRAFT  10/27/2009   5 vessel, Dr. Raoul Pitch   EYE SURGERY     HERNIA REPAIR     x 2     Social History   Socioeconomic History   Marital status: Widowed    Spouse name: Not on file   Number of children: 2   Years of education: 19   Highest education level: 12th grade  Occupational History   Occupation: retired    Comment: Librarian, academic  Tobacco Use   Smoking status: Never   Smokeless tobacco: Never  Vaping Use   Vaping status: Never Used  Substance and Sexual Activity   Alcohol use: No   Drug use: No   Sexual activity: Not Currently  Other Topics Concern   Not on file  Social History Narrative   Lives with his son. He has two children. Sits around house and watches TV and walks in the yard.   Social Drivers of Corporate investment banker Strain: Low Risk  (04/28/2022)   Overall Financial Resource Strain (CARDIA)    Difficulty of Paying Living Expenses: Not hard at all  Food Insecurity: No Food Insecurity (04/25/2023)   Received from [facility]   Hunger Vital Sign    Worried About Running Out of Food in the Last Year: Never true    Ran Out of Food in the Last Year: Never true  Transportation Needs: No Transportation Needs (04/28/2023)   Received from [facility] - Transportation    Lack of Transportation (Medical): No    Lack of Transportation (Non-Medical): No  Physical Activity: Insufficiently Active (04/28/2022)   Exercise Vital Sign    Days of Exercise per Week: 3 days    Minutes of Exercise per  Session: 40 min  Stress: No Stress Concern Present (04/25/2023)   Received from [facility] - Occupational Stress Questionnaire    Feeling of Stress : Only a little  Social Connections: Unknown (04/29/2022)   Social Connection and Isolation Panel [NHANES]    Frequency of Communication with Friends and Family: Not on file    Frequency of Social Gatherings with Friends and Family: Not on file    Attends Religious Services: 1 to 4 times per year    Active Member of Clubs or Organizations: Not on file    Attends Banker Meetings: Not on file    Marital Status: Not on file    Family History  Problem Relation Age of Onset   Heart disease Mother    Hypertension Mother    Alcohol abuse Father    Depression Daughter    Depression Son      Health Maintenance  Topic Date Due   OPHTHALMOLOGY EXAM  07/12/2022   COVID-19 Vaccine (8 - 2024-25 season) 02/12/2023   Medicare Annual Wellness (AWV)  04/30/2023   FOOT EXAM  06/18/2023   HEMOGLOBIN A1C  06/18/2023   DTaP/Tdap/Td (4 - Td or Tdap) 10/09/2025   Pneumonia Vaccine 56+ Years old  Completed   INFLUENZA VACCINE  Completed   Zoster Vaccines- Shingrix  Completed   HPV VACCINES  Aged Out     ----------------------------------------------------------------------------------------------------------------------------------------------------------------------------------------------------------------- Physical Exam BP 117/75 (BP Location: Left Arm, Patient Position: Sitting, Cuff Size: Normal)   Pulse 96   Ht 5\' 7"  (1.702 m)   Wt 127 lb 4 oz (57.7 kg)   SpO2 97%   BMI 19.93 kg/m   Physical Exam Constitutional:      Appearance: Normal appearance.  Eyes:     General: No scleral icterus. Cardiovascular:     Rate and Rhythm: Normal rate and regular rhythm.     Heart sounds: Murmur heard.  Pulmonary:     Effort: Pulmonary effort is normal.     Breath sounds: Normal breath sounds.  Neurological:     Mental Status: He is alert.  Psychiatric:        Mood and Affect: Mood normal.        Behavior: Behavior normal.     ------------------------------------------------------------------------------------------------------------------------------------------------------------------------------------------------------------------- Assessment and Plan  Acute on chronic congestive heart failure (HCC) He has done well with diuresis.  Continue lasix prn.  Update renal function/electrolytes.   COVID He has recovered well from this.  Red flags reviewed.     No orders of the defined types were placed in this encounter.   No follow-ups on file.    This visit occurred during the SARS-CoV-2 public health emergency.  Safety protocols were in place, including screening  questions prior to the visit, additional usage of staff PPE, and extensive cleaning of exam room while observing appropriate contact time as indicated for disinfecting solutions.

## 2023-05-09 ENCOUNTER — Encounter: Payer: Self-pay | Admitting: Family Medicine

## 2023-05-09 LAB — CBC WITH DIFFERENTIAL/PLATELET
Basophils Absolute: 0 10*3/uL (ref 0.0–0.2)
Basos: 0 %
EOS (ABSOLUTE): 0 10*3/uL (ref 0.0–0.4)
Eos: 0 %
Hematocrit: 44.6 % (ref 37.5–51.0)
Hemoglobin: 15.2 g/dL (ref 13.0–17.7)
Immature Grans (Abs): 0 10*3/uL (ref 0.0–0.1)
Immature Granulocytes: 0 %
Lymphocytes Absolute: 0.7 10*3/uL (ref 0.7–3.1)
Lymphs: 8 %
MCH: 32.3 pg (ref 26.6–33.0)
MCHC: 34.1 g/dL (ref 31.5–35.7)
MCV: 95 fL (ref 79–97)
Monocytes Absolute: 0.3 10*3/uL (ref 0.1–0.9)
Monocytes: 3 %
Neutrophils Absolute: 7.3 10*3/uL — ABNORMAL HIGH (ref 1.4–7.0)
Neutrophils: 89 %
Platelets: 129 10*3/uL — ABNORMAL LOW (ref 150–450)
RBC: 4.7 x10E6/uL (ref 4.14–5.80)
RDW: 12.2 % (ref 11.6–15.4)
WBC: 8.3 10*3/uL (ref 3.4–10.8)

## 2023-05-09 LAB — BASIC METABOLIC PANEL
BUN/Creatinine Ratio: 29 — ABNORMAL HIGH (ref 10–24)
BUN: 31 mg/dL — ABNORMAL HIGH (ref 8–27)
CO2: 29 mmol/L (ref 20–29)
Calcium: 8.7 mg/dL (ref 8.6–10.2)
Chloride: 95 mmol/L — ABNORMAL LOW (ref 96–106)
Creatinine, Ser: 1.08 mg/dL (ref 0.76–1.27)
Glucose: 310 mg/dL — ABNORMAL HIGH (ref 70–99)
Potassium: 4.4 mmol/L (ref 3.5–5.2)
Sodium: 138 mmol/L (ref 134–144)
eGFR: 66 mL/min/{1.73_m2} (ref >59)

## 2023-05-15 IMAGING — DX DG CHEST 2V
2 series · 2 of 2 positions shown · non-contrast
Comparison: 12/01/2017

CLINICAL DATA: Coughing, prolonged expiratory phase, shortness of
breath, history diabetes mellitus, coronary artery disease post
CABG, hypertension

EXAM:
CHEST - 2 VIEW

[chest pa]
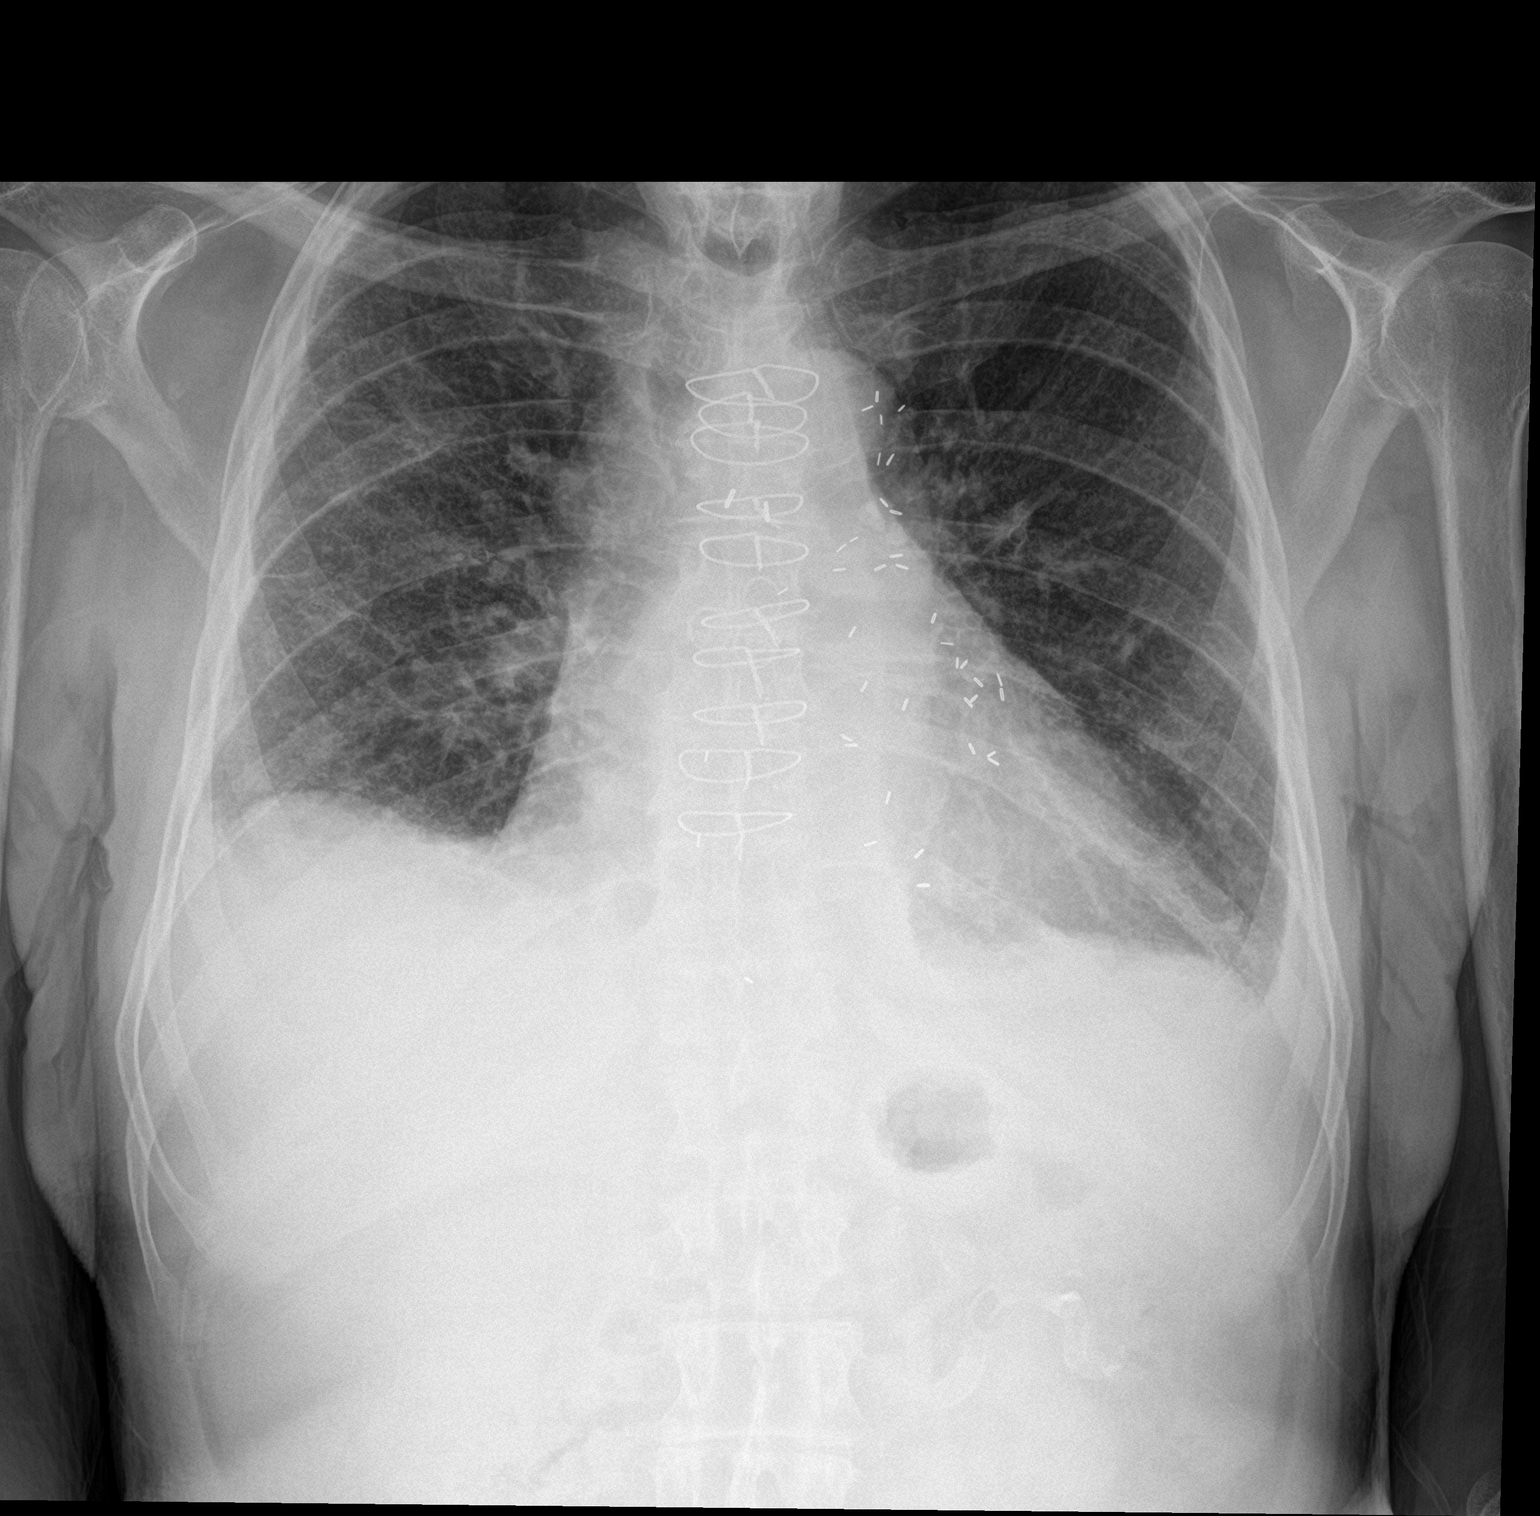

[chest lat]
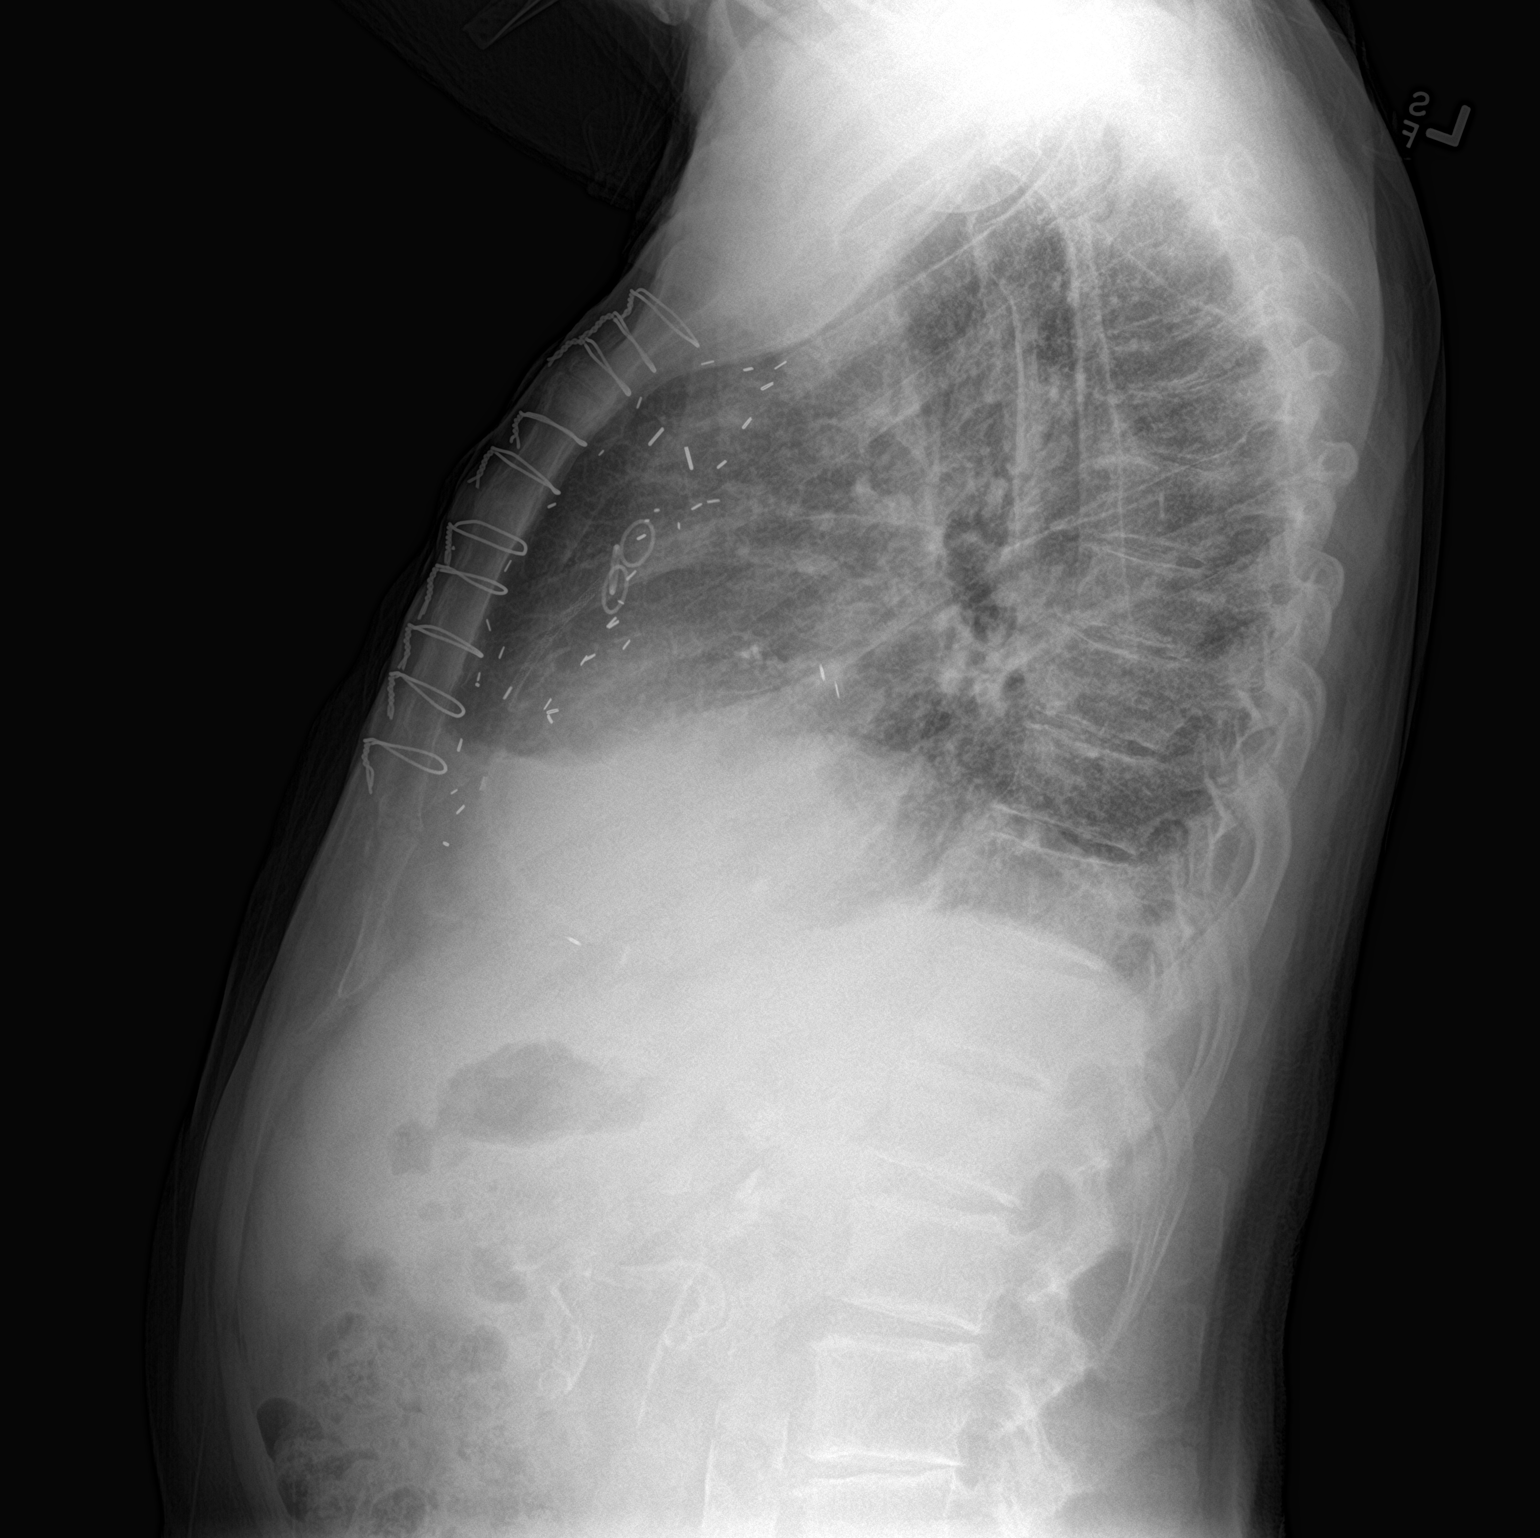

[2 of 2 positions shown; findings below may reference images not displayed]

FINDINGS: Enlargement of cardiac silhouette post CABG.

Mediastinal contours and pulmonary vascularity normal.

Chronic accentuation of interstitial markings at the mid to lower
lungs greater on RIGHT little changed since an earlier study of
05/22/2017.

Small LEFT and probable small RIGHT pleural effusions.

No pneumothorax or acute osseous findings.
IMPRESSION: Enlargement of cardiac silhouette.

Suspected small chronic pleural effusions.

Chronic accentuation of interstitial markings slightly greater on
RIGHT unchanged since 05/22/2017.

No acute abnormalities.

## 2023-05-20 ENCOUNTER — Ambulatory Visit (INDEPENDENT_AMBULATORY_CARE_PROVIDER_SITE_OTHER): Payer: Medicare PPO

## 2023-05-20 VITALS — Ht 67.5 in | Wt 140.0 lb

## 2023-05-20 DIAGNOSIS — Z0000 Encounter for general adult medical examination without abnormal findings: Secondary | ICD-10-CM | POA: Diagnosis not present

## 2023-05-20 NOTE — Progress Notes (Signed)
 Subjective:   Ryan Bernard is a 89 y.o. male who presents for Medicare Annual/Subsequent preventive examination.  Visit Complete: Virtual I connected with  Ryan Bernard on 05/20/23 by a audio enabled telemedicine application and verified that I am speaking with the correct person using two identifiers.  Patient Location: Home  Provider Location: Home Office  I discussed the limitations of evaluation and management by telemedicine. The patient expressed understanding and agreed to proceed.  Vital Signs: Because this visit was a virtual/telehealth visit, some criteria may be missing or patient reported. Any vitals not documented were not able to be obtained and vitals that have been documented are patient reported.  Patient Medicare AWV questionnaire was completed by the patient on n/a; I have confirmed that all information answered by patient is correct and no changes since this date.  Cardiac Risk Factors include: advanced age (>73men, >54 women);diabetes mellitus;hypertension;male gender     Objective:    Today's Vitals   05/20/23 1319  Weight: 140 lb (63.5 kg)  Height: 5' 7.5" (1.715 m)   Body mass index is 21.6 kg/m.     05/20/2023    1:27 PM 04/29/2022    2:08 PM 04/23/2021    1:52 PM 01/10/2021    2:30 PM 01/19/2020    9:24 AM 01/18/2019   10:57 AM 01/13/2018   10:53 AM  Advanced Directives  Does Patient Have a Medical Advance Directive? Yes Yes Yes No Yes Yes Yes  Type of Advance Directive Living will;Healthcare Power of Attorney Living will Healthcare Power of [place];Living will  Healthcare Power of [place];Living will Healthcare Power of [place];Living will Healthcare Power of [place];Living will  Does patient want to make changes to medical advance directive?  No - Patient declined No - Patient declined  No - Patient declined No - Patient declined No - Patient declined  Copy of Healthcare Power of Attorney in Chart? No - copy requested  No - copy requested  No -  copy requested No - copy requested No - copy requested  Would patient like information on creating a medical advance directive?    No - Patient declined       Current Medications (verified) Outpatient Encounter Medications as of 05/20/2023  Medication Sig   albuterol (PROVENTIL HFA;VENTOLIN HFA) 108 (90 Base) MCG/ACT inhaler Inhale 2 puffs into the lungs every 6 (six) hours as needed for wheezing.   apixaban (ELIQUIS) 5 MG TABS tablet Take 5 mg by mouth 2 (two) times daily.   dapagliflozin propanediol (FARXIGA) 10 MG TABS tablet Take 1 tablet by mouth daily.   furosemide (LASIX) 20 MG tablet Take 1 tablet (20 mg total) by mouth daily.   metoprolol succinate (TOPROL-XL) 100 MG 24 hr tablet Take 100 mg by mouth daily. Take with or immediately following a meal.   pravastatin (PRAVACHOL) 80 MG tablet Take 1 tablet (80 mg total) by mouth at bedtime.   spironolactone (ALDACTONE) 25 MG tablet Take 1 tablet by mouth daily.   terazosin (HYTRIN) 10 MG capsule Take 10 mg by mouth daily.     No facility-administered encounter medications on file as of 05/20/2023.    Allergies (verified) Patient has no known allergies.   History: Past Medical History:  Diagnosis Date   Anemia    Arthritis    BPH (benign prostatic hyperplasia)    CAD (coronary artery disease) 03/11/1993   w/ angioplasty   CHF (congestive heart failure) (HCC)    Chronic kidney disease    Chronic obstructive  pulmonary disease (HCC) 12/17/2021   Chronic obstructive pulmonary disease (HCC) 12/17/2021   Diabetes mellitus 03/11/2002   type 2   Heart disease    Mild valvular w/ pulm HTN   Heart murmur    Hyperlipidemia    Hypertension    SBE (subacute bacterial endocarditis) prophylaxis candidate    Well adult exam 12/17/2021   Past Surgical History:  Procedure Laterality Date   ANGIOPLASTY  03/11/1993   [facility]   BACK SURGERY  03/11/1962   CORONARY ARTERY BYPASS GRAFT  10/27/2009   5 vessel, Dr. Raoul Pitch   EYE  SURGERY     HERNIA REPAIR     x 2    Family History  Problem Relation Age of Onset   Heart disease Mother    Hypertension Mother    Alcohol abuse Father    Depression Daughter    Depression Son    Social History   Socioeconomic History   Marital status: Widowed    Spouse name: Not on file   Number of children: 2   Years of education: 32   Highest education level: 12th grade  Occupational History   Occupation: retired    Comment: Insurance claims handler  Tobacco Use   Smoking status: Never   Smokeless tobacco: Never  Vaping Use   Vaping status: Never Used  Substance and Sexual Activity   Alcohol use: No   Drug use: No   Sexual activity: Not Currently  Other Topics Concern   Not on file  Social History Narrative   Lives with his son. He has two children. Sits around house and watches TV and walks in the yard.   Social Drivers of Corporate investment banker Strain: Low Risk  (05/20/2023)   Overall Financial Resource Strain (CARDIA)    Difficulty of Paying Living Expenses: Not hard at all  Food Insecurity: No Food Insecurity (05/20/2023)   Hunger Vital Sign    Worried About Running Out of Food in the Last Year: Never true    Ran Out of Food in the Last Year: Never true  Transportation Needs: No Transportation Needs (05/20/2023)   PRAPARE - Administrator, Civil Service (Medical): No    Lack of Transportation (Non-Medical): No  Physical Activity: Sufficiently Active (05/20/2023)   Exercise Vital Sign    Days of Exercise per Week: 6 days    Minutes of Exercise per Session: 40 min  Stress: No Stress Concern Present (05/20/2023)   [name] of Occupational Health - Occupational Stress Questionnaire    Feeling of Stress : Not at all  Social Connections: Moderately Integrated (05/20/2023)   Social Connection and Isolation Panel [NHANES]    Frequency of Communication with Friends and Family: More than three times a week    Frequency of Social Gatherings with Friends  and Family: More than three times a week    Attends Religious Services: More than 4 times per year    Active Member of [employer] or Organizations: Yes    Attends Banker Meetings: More than 4 times per year    Marital Status: Widowed    Tobacco Counseling Counseling given: Not Answered   Clinical Intake:  Pre-visit preparation completed: Yes  Pain : No/denies pain     BMI - recorded: 21.6 Nutritional Status: BMI of 19-24  Normal Nutritional Risks: None Diabetes: Yes CBG done?: No Did pt. bring in CBG monitor from home?: No  How often do you need to have someone  help you when you read instructions, pamphlets, or other written materials from your doctor or pharmacy?: 1 - Never What is the last grade level you completed in school?: 12  Interpreter Needed?: No      Activities of Daily Living    05/20/2023    1:20 PM 05/03/2023   11:52 AM  In your present state of health, do you have any difficulty performing the following activities:  Hearing? 1 1  Vision? 1 1  Difficulty concentrating or making decisions? 0 0  Walking or climbing stairs? 1 1  Dressing or bathing? 0 0  Doing errands, shopping? 0 0  Preparing Food and eating ? N N  Using the Toilet? N N  In the past six months, have you accidently leaked urine? N N  Do you have problems with loss of bowel control? N N  Managing your Medications? N N  Managing your Finances? Malvin Johns  Housekeeping or managing your Housekeeping? Malvin Johns    Patient Care Team: Everrett Coombe, DO as PCP - General (Family Medicine) Hassan Rowan, MD (Inactive) (Family Medicine) Hassan Rowan, MD (Inactive) (Family Medicine)  Indicate any recent Medical Services you may have received from other than [facility] providers in the past year (date may be approximate).     Assessment:   This is a routine wellness examination for Ryan Bernard.  Hearing/Vision screen No results found.   Goals Addressed             This Visit's Progress     Activity and Exercise Increased       He would like to walk more.       Depression Screen    05/20/2023    1:26 PM 12/18/2022    9:43 AM 06/18/2022    9:16 AM 04/29/2022    2:04 PM 12/17/2021    9:28 AM 04/23/2021    1:49 PM 12/11/2020    8:20 AM  PHQ 2/9 Scores  PHQ - 2 Score 0 0 0 0 2 0 0  PHQ- 9 Score     5      Fall Risk    05/20/2023    1:27 PM 05/03/2023   11:52 AM 12/18/2022    9:43 AM 06/18/2022    9:16 AM 04/29/2022    2:04 PM  Fall Risk   Falls in the past year? 1 1 0 0 0  Number falls in past yr: 0 0 0 0 0  Injury with Fall? 1 1 0 0 0  Risk for fall due to : Impaired balance/gait  No Fall Risks No Fall Risks No Fall Risks  Follow up Falls evaluation completed  Falls evaluation completed Falls evaluation completed Falls evaluation completed    MEDICARE RISK AT HOME: Medicare Risk at Home Any stairs in or around the home?: No If so, are there any without handrails?: No Home free of loose throw rugs in walkways, pet beds, electrical cords, etc?: No Adequate lighting in your home to reduce risk of falls?: Yes Life alert?: Yes Use of a cane, walker or w/c?: Yes Grab bars in the bathroom?: Yes Shower chair or bench in shower?: Yes Elevated toilet seat or a handicapped toilet?: Yes  TIMED UP AND GO:  Was the test performed?  No    Cognitive Function:        05/20/2023    1:28 PM 04/29/2022    2:09 PM 04/23/2021    1:56 PM 01/19/2020    9:26 AM 01/18/2019   11:01  AM  6CIT Screen  What Year? 0 points 4 points 0 points 0 points 0 points  What month? 0 points 0 points 0 points 0 points 0 points  What time? 0 points 0 points 0 points 0 points 0 points  Count back from 20 0 points 0 points 0 points 0 points 0 points  Months in reverse 0 points 2 points 0 points 0 points   Repeat phrase 4 points 2 points 0 points 10 points 4 points  Total Score 4 points 8 points 0 points 10 points     Immunizations Immunization History  Administered Date(s) Administered   Fluad  Quad(high Dose 65+) 11/02/2018, 12/21/2019, 12/11/2020, 12/17/2021   Fluad Trivalent(High Dose 65+) 12/18/2022   Influenza Split 12/24/2011, 12/09/2012, 12/09/2013   Influenza Whole 01/09/2009, 12/20/2009, 12/28/2010   Influenza, High Dose Seasonal PF 12/09/2009, 01/10/2011, 12/10/2011, 11/02/2015, 10/09/2016, 10/30/2016, 12/01/2017   Influenza, Seasonal, Injecte, Preservative Fre 11/06/2012, 12/14/2013, 01/10/2015   Influenza,inj,Quad PF,6+ Mos 11/06/2012, 12/14/2013, 01/10/2015   Influenza-Unspecified 12/09/2000, 12/09/2001, 12/10/2002, 01/16/2004, 01/09/2005, 01/09/2006, 01/10/2007, 11/24/2007, 01/09/2009, 11/02/2015, 10/30/2016, 12/01/2017, 12/09/2018, 12/21/2019, 12/18/2021, 03/11/2022   Moderna Sars-Covid-2 Vaccination 04/23/2019, 05/21/2019, 09/20/2019, 03/13/2020   PFIZER(Purple Top)SARS-COV-2 Vaccination 05/04/2019, 05/25/2019   Pfizer(Comirnaty)Fall Seasonal Vaccine 12 years and older 12/18/2022   Pneumococcal Conjugate-13 03/16/2014   Pneumococcal Polysaccharide-23 08/20/2011   Pneumococcal-Unspecified 12/09/2000   Rsv, Bivalent, Protein Subunit Rsvpref,pf (Abrysvo) 03/28/2022   Td 12/10/2002   Tdap 10/10/2015   Tetanus 09/08/2009   Zoster Recombinant(Shingrix) 06/23/2020, 08/22/2020    TDAP status: Up to date  Flu Vaccine status: Up to date  Pneumococcal vaccine status: Up to date  Covid-19 vaccine status: Information provided on how to obtain vaccines.   Qualifies for Shingles Vaccine? Yes   Zostavax completed No   Shingrix Completed?: Yes  Screening Tests Health Maintenance  Topic Date Due   OPHTHALMOLOGY EXAM  07/12/2022   COVID-19 Vaccine (8 - 2024-25 season) 02/12/2023   FOOT EXAM  06/18/2023   HEMOGLOBIN A1C  06/18/2023   Medicare Annual Wellness (AWV)  05/19/2024   DTaP/Tdap/Td (4 - Td or Tdap) 10/09/2025   Pneumonia Vaccine 20+ Years old  Completed   INFLUENZA VACCINE  Completed   Zoster Vaccines- Shingrix  Completed   HPV VACCINES  Aged Out     Health Maintenance  Health Maintenance Due  Topic Date Due   OPHTHALMOLOGY EXAM  07/12/2022   COVID-19 Vaccine (8 - 2024-25 season) 02/12/2023    Colorectal cancer screening: No longer required.   Lung Cancer Screening: (Low Dose CT Chest recommended if Age 37-80 years, 20 pack-year currently smoking OR have quit w/in 15years.) does not qualify.   Lung Cancer Screening Referral: n/a  Additional Screening:  Hepatitis C Screening: does not qualify; Completed   Vision Screening: Recommended annual ophthalmology exams for early detection of glaucoma and other disorders of the eye. Is the patient up to date with their annual eye exam?  Yes  Who is the provider or what is the name of the office in which the patient attends annual eye exams? [facility]  If pt is not established with a provider, would they like to be referred to a provider to establish care?  N/a .   Dental Screening: Recommended annual dental exams for proper oral hygiene  Diabetic Foot Exam: Diabetic Foot Exam: Completed 06/18/2022  Community Resource Referral / Chronic Care Management: CRR required this visit?  No   CCM required this visit?  No     Plan:  I have personally reviewed and noted the following in the patient's chart:   Medical and social history Use of alcohol, tobacco or illicit drugs  Current medications and supplements including opioid prescriptions. Patient is not currently taking opioid prescriptions. Functional ability and status Nutritional status Physical activity Advanced directives List of other physicians Hospitalizations, surgeries, and ER visits in previous 12 months Vitals Screenings to include cognitive, depression, and falls Referrals and appointments  In addition, I have reviewed and discussed with patient certain preventive protocols, quality metrics, and best practice recommendations. A written personalized care plan for preventive services as well as general  preventive health recommendations were provided to patient.     Esmond Harps, CMA   05/20/2023   After Visit Summary: (MyChart) Due to this being a telephonic visit, the after visit summary with patients personalized plan was offered to patient via MyChart   Nurse Notes:   Ryan Bernard is a 89 y.o. male patient of Everrett Coombe, DO who had a Medicare Annual Wellness Visit today via telephone. Ryan Bernard is Retired and lives with their son. He has 2 children. He reports that he is socially active and does interact with friends/family regularly. He is moderately physically active and enjoys walking in the yard and sitting around the house   Sent a fax for most recent eye exam.

## 2023-05-20 NOTE — Patient Instructions (Signed)
  Mr. Ryan Bernard , Thank you for taking time to come for your Medicare Wellness Visit. I appreciate your ongoing commitment to your health goals. Please review the following plan we discussed and let me know if I can assist you in the future.   These are the goals we discussed:  Goals       Activity and Exercise Increased      He would like to walk more.       Exercise 3x per week (30 min per time)      Increase exercise as tolerated. Using a pedal bike for exercise.      Patient Stated (pt-stated)      04/23/2021 AWV Goal: Exercise for General Health  Patient will verbalize understanding of the benefits of increased physical activity: Exercising regularly is important. It will improve your overall fitness, flexibility, and endurance. Regular exercise also will improve your overall health. It can help you control your weight, reduce stress, and improve your bone density. Over the next year, patient will increase physical activity as tolerated with a goal of at least 150 minutes of moderate physical activity per week.  You can tell that you are exercising at a moderate intensity if your heart starts beating faster and you start breathing faster but can still hold a conversation. Moderate-intensity exercise ideas include: Walking 1 mile (1.6 km) in about 15 minutes Biking Hiking Golfing Dancing Water aerobics Patient will verbalize understanding of everyday activities that increase physical activity by providing examples like the following: Yard work, such as: Insurance underwriter Gardening Washing windows or floors Patient will be able to explain general safety guidelines for exercising:  Before you start a new exercise program, talk with your health care provider. Do not exercise so much that you hurt yourself, feel dizzy, or get very short of breath. Wear comfortable clothes and wear shoes with good  support. Drink plenty of water while you exercise to prevent dehydration or heat stroke. Work out until your breathing and your heartbeat get faster.       Patient Stated (pt-stated)      Patient stated that he would like able to increase the walking more on daily basis.        This is a list of the screening recommended for you and due dates:  Health Maintenance  Topic Date Due   Eye exam for diabetics  07/12/2022   COVID-19 Vaccine (8 - 2024-25 season) 02/12/2023   Complete foot exam   06/18/2023   Hemoglobin A1C  06/18/2023   Medicare Annual Wellness Visit  05/19/2024   DTaP/Tdap/Td vaccine (4 - Td or Tdap) 10/09/2025   Pneumonia Vaccine  Completed   Flu Shot  Completed   Zoster (Shingles) Vaccine  Completed   HPV Vaccine  Aged Out

## 2023-05-26 DIAGNOSIS — I5023 Acute on chronic systolic (congestive) heart failure: Secondary | ICD-10-CM | POA: Diagnosis not present

## 2023-05-26 DIAGNOSIS — R0989 Other specified symptoms and signs involving the circulatory and respiratory systems: Secondary | ICD-10-CM | POA: Diagnosis not present

## 2023-05-26 DIAGNOSIS — R0981 Nasal congestion: Secondary | ICD-10-CM | POA: Diagnosis not present

## 2023-05-26 DIAGNOSIS — N400 Enlarged prostate without lower urinary tract symptoms: Secondary | ICD-10-CM | POA: Diagnosis not present

## 2023-05-26 DIAGNOSIS — I255 Ischemic cardiomyopathy: Secondary | ICD-10-CM | POA: Diagnosis not present

## 2023-05-26 DIAGNOSIS — Z8679 Personal history of other diseases of the circulatory system: Secondary | ICD-10-CM | POA: Diagnosis not present

## 2023-05-26 DIAGNOSIS — R0902 Hypoxemia: Secondary | ICD-10-CM | POA: Diagnosis not present

## 2023-05-26 DIAGNOSIS — I517 Cardiomegaly: Secondary | ICD-10-CM | POA: Diagnosis not present

## 2023-05-26 DIAGNOSIS — E119 Type 2 diabetes mellitus without complications: Secondary | ICD-10-CM | POA: Diagnosis not present

## 2023-05-26 DIAGNOSIS — E1122 Type 2 diabetes mellitus with diabetic chronic kidney disease: Secondary | ICD-10-CM | POA: Diagnosis not present

## 2023-05-26 DIAGNOSIS — U071 COVID-19: Secondary | ICD-10-CM | POA: Diagnosis not present

## 2023-05-26 DIAGNOSIS — E785 Hyperlipidemia, unspecified: Secondary | ICD-10-CM | POA: Diagnosis not present

## 2023-05-26 DIAGNOSIS — R7989 Other specified abnormal findings of blood chemistry: Secondary | ICD-10-CM | POA: Diagnosis not present

## 2023-05-26 DIAGNOSIS — R059 Cough, unspecified: Secondary | ICD-10-CM | POA: Diagnosis not present

## 2023-05-26 DIAGNOSIS — I080 Rheumatic disorders of both mitral and aortic valves: Secondary | ICD-10-CM | POA: Diagnosis not present

## 2023-05-26 DIAGNOSIS — Z7409 Other reduced mobility: Secondary | ICD-10-CM | POA: Diagnosis not present

## 2023-05-26 DIAGNOSIS — I5022 Chronic systolic (congestive) heart failure: Secondary | ICD-10-CM | POA: Insufficient documentation

## 2023-05-26 DIAGNOSIS — J9601 Acute respiratory failure with hypoxia: Secondary | ICD-10-CM | POA: Diagnosis not present

## 2023-05-26 DIAGNOSIS — I2510 Atherosclerotic heart disease of native coronary artery without angina pectoris: Secondary | ICD-10-CM | POA: Diagnosis not present

## 2023-05-26 DIAGNOSIS — N1831 Chronic kidney disease, stage 3a: Secondary | ICD-10-CM | POA: Diagnosis not present

## 2023-05-26 DIAGNOSIS — I482 Chronic atrial fibrillation, unspecified: Secondary | ICD-10-CM | POA: Diagnosis not present

## 2023-05-26 DIAGNOSIS — R0602 Shortness of breath: Secondary | ICD-10-CM | POA: Diagnosis not present

## 2023-05-26 DIAGNOSIS — I130 Hypertensive heart and chronic kidney disease with heart failure and stage 1 through stage 4 chronic kidney disease, or unspecified chronic kidney disease: Secondary | ICD-10-CM | POA: Diagnosis not present

## 2023-05-26 DIAGNOSIS — J90 Pleural effusion, not elsewhere classified: Secondary | ICD-10-CM | POA: Diagnosis not present

## 2023-05-27 DIAGNOSIS — J90 Pleural effusion, not elsewhere classified: Secondary | ICD-10-CM | POA: Diagnosis not present

## 2023-05-27 DIAGNOSIS — I5023 Acute on chronic systolic (congestive) heart failure: Secondary | ICD-10-CM | POA: Diagnosis not present

## 2023-05-27 DIAGNOSIS — I080 Rheumatic disorders of both mitral and aortic valves: Secondary | ICD-10-CM | POA: Diagnosis not present

## 2023-05-28 DIAGNOSIS — Z7409 Other reduced mobility: Secondary | ICD-10-CM | POA: Diagnosis not present

## 2023-05-28 DIAGNOSIS — I5023 Acute on chronic systolic (congestive) heart failure: Secondary | ICD-10-CM | POA: Diagnosis not present

## 2023-05-29 DIAGNOSIS — I5023 Acute on chronic systolic (congestive) heart failure: Secondary | ICD-10-CM | POA: Diagnosis not present

## 2023-05-30 ENCOUNTER — Telehealth: Payer: Self-pay

## 2023-05-30 DIAGNOSIS — Z7409 Other reduced mobility: Secondary | ICD-10-CM | POA: Diagnosis not present

## 2023-05-30 DIAGNOSIS — I482 Chronic atrial fibrillation, unspecified: Secondary | ICD-10-CM | POA: Diagnosis not present

## 2023-05-30 NOTE — Transitions of Care (Post Inpatient/ED Visit) (Signed)
 05/30/2023  Name: Ryan Bernard MRN: [id number] DOB: 03-Feb-1932  Today's TOC FU Call Status: Today's TOC FU Call Status:: Successful TOC FU Call Completed TOC FU Call Complete Date: 05/30/23 Patient's Name and Date of Birth confirmed.  Transition Care Management Follow-up Telephone Call Date of Discharge: 05/29/23 Discharge Facility: Other ([facility]) Name of Other ([facility]) Discharge Facility: [facility] Type of Discharge: Inpatient Admission Primary Inpatient Discharge Diagnosis:: Systolic CHF, acute on chronic How have you been since you were released from the hospital?: Better Any questions or concerns?: No (Patient /Son deny questions)  Items Reviewed: Did you receive and understand the discharge instructions provided?: Yes Medications obtained,verified, and reconciled?: Yes (Medications Reviewed) Any new allergies since your discharge?: No Dietary orders reviewed?: Yes Type of Diet Ordered:: heart healthy Do you have support at home?: Yes People in Home: child(ren), adult Name of Support/Comfort Primary Source: Meryl Crutch  Medications Reviewed Today: Medications Reviewed Today     Reviewed by Jessy Oto, RN (Registered Nurse) on 05/30/23 at [phone]  Med List Status: <None>   Medication Order Taking? Sig Documenting Provider Last Dose Status Informant  albuterol (PROVENTIL HFA;VENTOLIN HFA) 108 (90 Base) MCG/ACT inhaler [id number] Yes Inhale 2 puffs into the lungs every 6 (six) hours as needed for wheezing. Sunnie Nielsen, DO Taking Active   apixaban (ELIQUIS) 5 MG TABS tablet [id number] Yes Take 5 mg by mouth 2 (two) times daily. [provider] Taking Active   dapagliflozin propanediol (FARXIGA) 10 MG TABS tablet [id number] Yes Take 1 tablet by mouth daily. [provider] Taking Active   furosemide (LASIX) 20 MG tablet [id number] Yes Take 1 tablet (20 mg total) by mouth daily. Sunnie Nielsen, DO Taking Active             Med Note [facility], Gianlucas Evenson A   Fri May 30, 2023  9:47 AM) Per 05/29/23 hospital d/c and patient - PRN  ipratropium-albuterol (DUONEB) 0.5-2.5 (3) MG/3ML SOLN [id number] Yes Take 3 mLs by nebulization every 6 (six) hours as needed. [provider] Taking Active   metoprolol succinate (TOPROL-XL) 100 MG 24 hr tablet [id number] Yes Take 100 mg by mouth daily. Take with or immediately following a meal. [provider] Taking Active   pravastatin (PRAVACHOL) 80 MG tablet [id number] Yes Take 1 tablet (80 mg total) by mouth at bedtime. Everrett Coombe, DO Taking Active   spironolactone (ALDACTONE) 25 MG tablet [id number] Yes Take 1 tablet by mouth daily. [provider] Taking Active   terazosin (HYTRIN) 10 MG capsule [id number] Yes Take 10 mg by mouth daily.   [provider] Taking Active             Home Care and Equipment/Supplies: Were Home Health Services Ordered?: NA Any new equipment or medical supplies ordered?: Yes (Patient home with Oxygen 2 liters and a walker) Name of Medical supply agency?: [employer] Were you able to get the equipment/medical supplies?: Yes Do you have any questions related to the use of the equipment/supplies?: No  Functional Questionnaire: Do you need assistance with bathing/showering or dressing?: No Do you need assistance with meal preparation?: No Do you need assistance with eating?: No Do you need assistance with getting out of bed/getting out of a chair/moving?: No Do you have difficulty managing or taking your medications?: No  Follow up appointments reviewed: PCP Follow-up appointment confirmed?: Yes (scheduled by care guide during call) Date of PCP follow-up appointment?: 06/05/23 Follow-up Provider: Everrett Coombe Specialist  [facility] Follow-up appointment confirmed?: Yes  Date of Specialist follow-up appointment?: 06/05/23 Follow-Up Specialty Provider:: Megan with Dr Yetta Barre, cardiology Do you need transportation to your  follow-up appointment?: No Do you understand care options if your condition(s) worsen?: Yes-patient verbalized understanding  SDOH Interventions Today    Flowsheet Row Most Recent Value  SDOH Interventions   Food Insecurity Interventions Intervention Not Indicated  Housing Interventions Intervention Not Indicated  Transportation Interventions Intervention Not Indicated  Utilities Interventions Intervention Not Indicated       Goals Addressed               This Visit's Progress     TOC Care Plan - Patient will report no readmissions in the next 30 days (pt-stated)        Current Barriers:  Equipment/DME Son/Patient unsure about who provided oxygen and how to obtain extra supplies   RNCM Clinical Goal(s):  Patient will work with the Care Management team over the next 30 days to address Transition of Care Barriers: Equipment/DME Patient will take all medications exactly as prescribed and will call provider for medication related questions as evidenced by patient report and health record Patient will demonstrate understanding of rationale for each prescribed medication as evidenced by patient/son verbalization Patient will attend all scheduled medical appointments: PCP hospital follow up scheduled for 06/05/23 during [facility] RN call - cardiology appointment scheduled 06/05/23 Son will work with [employer] to obtain any needed oxygen supplies   Patient will manage CHF symptoms and report any issues/concerns immediately  Interventions: Evaluation of current treatment plan related to  self management and patient's adherence to plan as established by provider   Transitions of Care:  New goal. Durable Medical Equipment (DME) needs assessed with patient/caregiver and reviewed with patient/caregiver ([employer] for oxygen - number provided)  Doctor Visits  - discussed the importance of doctor visits - PCP hospital follow up scheduled with Care guide during call 06/05/23  Reviewed patient has Life Alert  necklace - son lives with patient and provides assistance  Heart Failure Interventions:  (Status:  New goal.) Short Term Goal Basic overview and discussion of pathophysiology of Heart Failure reviewed Provided education on low sodium diet Advised patient to weigh each morning after emptying bladder Discussed importance of daily weight and advised patient to weigh and record daily - patient reports was 140lbs on admission and is 130lbs today   Patient Goals/Self-Care Activities: Participate in Transition of Care Program/Attend TOC scheduled calls Notify RN Care Manager of TOC call rescheduling needs Take all medications as prescribed Attend all scheduled provider appointments call office if I gain more than 2 pounds in one day or 5 pounds in one week use salt in moderation watch for swelling in feet, ankles and legs every day weigh myself daily  Follow Up Plan:  Telephone follow up appointment with care management team member scheduled for:  06/06/23 1pm The patient has been provided with contact information for the care management team and has been advised to call with any health related questions or concerns.          Hilbert Odor RN, CCM [place]  VBCI-Population Health RN Care Manager [phone]

## 2023-06-05 ENCOUNTER — Ambulatory Visit (INDEPENDENT_AMBULATORY_CARE_PROVIDER_SITE_OTHER): Admitting: Family Medicine

## 2023-06-05 ENCOUNTER — Encounter: Payer: Self-pay | Admitting: Family Medicine

## 2023-06-05 VITALS — BP 121/64 | HR 100 | Ht 67.5 in | Wt 131.0 lb

## 2023-06-05 DIAGNOSIS — I509 Heart failure, unspecified: Secondary | ICD-10-CM

## 2023-06-05 DIAGNOSIS — J9610 Chronic respiratory failure, unspecified whether with hypoxia or hypercapnia: Secondary | ICD-10-CM | POA: Insufficient documentation

## 2023-06-05 DIAGNOSIS — E785 Hyperlipidemia, unspecified: Secondary | ICD-10-CM | POA: Diagnosis not present

## 2023-06-05 DIAGNOSIS — I482 Chronic atrial fibrillation, unspecified: Secondary | ICD-10-CM | POA: Diagnosis not present

## 2023-06-05 DIAGNOSIS — J9611 Chronic respiratory failure with hypoxia: Secondary | ICD-10-CM | POA: Diagnosis not present

## 2023-06-05 DIAGNOSIS — I5022 Chronic systolic (congestive) heart failure: Secondary | ICD-10-CM | POA: Diagnosis not present

## 2023-06-05 DIAGNOSIS — Z951 Presence of aortocoronary bypass graft: Secondary | ICD-10-CM | POA: Diagnosis not present

## 2023-06-05 DIAGNOSIS — I4891 Unspecified atrial fibrillation: Secondary | ICD-10-CM | POA: Diagnosis not present

## 2023-06-05 DIAGNOSIS — I2510 Atherosclerotic heart disease of native coronary artery without angina pectoris: Secondary | ICD-10-CM | POA: Diagnosis not present

## 2023-06-05 DIAGNOSIS — I10 Essential (primary) hypertension: Secondary | ICD-10-CM | POA: Diagnosis not present

## 2023-06-05 DIAGNOSIS — I255 Ischemic cardiomyopathy: Secondary | ICD-10-CM | POA: Diagnosis not present

## 2023-06-05 NOTE — Assessment & Plan Note (Signed)
 Chronic in nature.  Anticoagulated with Eliquis.  Rate control with metoprolol.  Overall stable at this time.

## 2023-06-05 NOTE — Assessment & Plan Note (Signed)
 Recent hospitalization for acute on chronic CHF.  Appears euvolemic on exam today.  He will continue with furosemide at current strength.  Limit sodium intake.

## 2023-06-05 NOTE — Progress Notes (Signed)
 Ryan Bernard - 89 y.o. male MRN [id number]  Date of birth: [birth year]  Subjective Chief Complaint  Patient presents with   Hospitalization Follow-up    HPI Ryan Bernard is a 89 y.o. male here today for follow-up of recent hospitalization.  He was hospitalized for a acute on chronic CHF exacerbation as well as of acute hypoxic respiratory failure.  He was hospitalized for COVID a few weeks prior to this admission.  He received IV diuresis and fluid restriction during his hospitalization.  O2 was supplemented as needed during hospitalization.  He was discharged with home O2.  He is using oxygen at home fairly consistently.  He is sleeping with this at night.  Today he reports that he is feeling pretty well.  He denies increased dyspnea.  This pretty good with home O2.  Denies increased cough or exertional dyspnea.  He has not had fever or chills.  He is checking daily weights.  Continues on furosemide 20 mg daily.  Additionally he remains on Aldactone.  ROS:  A comprehensive ROS was completed and negative except as noted per HPI  No Known Allergies  Past Medical History:  Diagnosis Date   Anemia    Arthritis    BPH (benign prostatic hyperplasia)    CAD (coronary artery disease) 03/11/1993   w/ angioplasty   CHF (congestive heart failure) (HCC)    Chronic kidney disease    Chronic obstructive pulmonary disease (HCC) 12/17/2021   Chronic obstructive pulmonary disease (HCC) 12/17/2021   Diabetes mellitus 03/11/2002   type 2   Heart disease    Mild valvular w/ pulm HTN   Heart murmur    Hyperlipidemia    Hypertension    SBE (subacute bacterial endocarditis) prophylaxis candidate    Well adult exam 12/17/2021    Past Surgical History:  Procedure Laterality Date   ANGIOPLASTY  03/11/1993   [facility]   BACK SURGERY  03/11/1962   CORONARY ARTERY BYPASS GRAFT  10/27/2009   5 vessel, Dr. Raoul Pitch   EYE SURGERY     HERNIA REPAIR     x 2     Social History    Socioeconomic History   Marital status: Widowed    Spouse name: Not on file   Number of children: 2   Years of education: 16   Highest education level: 12th grade  Occupational History   Occupation: retired    Comment: Insurance claims handler  Tobacco Use   Smoking status: Never   Smokeless tobacco: Never  Vaping Use   Vaping status: Never Used  Substance and Sexual Activity   Alcohol use: No   Drug use: No   Sexual activity: Not Currently  Other Topics Concern   Not on file  Social History Narrative   Lives with his son. He has two children. Sits around house and watches TV and walks in the yard.   Social Drivers of Corporate investment banker Strain: Low Risk  (05/20/2023)   Overall Financial Resource Strain (CARDIA)    Difficulty of Paying Living Expenses: Not hard at all  Food Insecurity: No Food Insecurity (05/30/2023)   Hunger Vital Sign    Worried About Running Out of Food in the Last Year: Never true    Ran Out of Food in the Last Year: Never true  Transportation Needs: No Transportation Needs (05/30/2023)   PRAPARE - Administrator, Civil Service (Medical): No    Lack of Transportation (Non-Medical): No  Physical Activity: Sufficiently  Active (05/20/2023)   Exercise Vital Sign    Days of Exercise per Week: 6 days    Minutes of Exercise per Session: 40 min  Stress: No Stress Concern Present (05/28/2023)   Received from [facility] - Occupational Stress Questionnaire    Feeling of Stress : Only a little  Social Connections: Moderately Integrated (05/20/2023)   Social Connection and Isolation Panel [NHANES]    Frequency of Communication with Friends and Family: More than three times a week    Frequency of Social Gatherings with Friends and Family: More than three times a week    Attends Religious Services: More than 4 times per year    Active Member of [employer] or Organizations: Yes    Attends Banker  Meetings: More than 4 times per year    Marital Status: Widowed    Family History  Problem Relation Age of Onset   Heart disease Mother    Hypertension Mother    Alcohol abuse Father    Depression Daughter    Depression Son     Health Maintenance  Topic Date Due   OPHTHALMOLOGY EXAM  07/12/2022   FOOT EXAM  06/18/2023   HEMOGLOBIN A1C  06/18/2023   COVID-19 Vaccine (8 - Mixed Product risk 2024-25 season) 06/18/2023   Medicare Annual Wellness (AWV)  05/19/2024   DTaP/Tdap/Td (4 - Td or Tdap) 10/09/2025   Pneumonia Vaccine 83+ Years old  Completed   INFLUENZA VACCINE  Completed   Zoster Vaccines- Shingrix  Completed   HPV VACCINES  Aged Out     ----------------------------------------------------------------------------------------------------------------------------------------------------------------------------------------------------------------- Physical Exam BP 121/64 (BP Location: Left Arm, Patient Position: Sitting, Cuff Size: Small)   Pulse 100   Ht 5' 7.5" (1.715 m)   Wt 131 lb (59.4 kg)   SpO2 96%   BMI 20.21 kg/m   Physical Exam Constitutional:      Appearance: Normal appearance.  HENT:     Head: Normocephalic and atraumatic.  Eyes:     General: No scleral icterus. Cardiovascular:     Rate and Rhythm: Normal rate.     Heart sounds: Murmur heard.     Comments: No increased edema Pulmonary:     Effort: Pulmonary effort is normal.     Breath sounds: Normal breath sounds.  Musculoskeletal:     Cervical back: Neck supple.  Skin:    General: Skin is warm and dry.  Neurological:     Mental Status: He is alert.     ------------------------------------------------------------------------------------------------------------------------------------------------------------------------------------------------------------------- Assessment and Plan  Atrial fibrillation (HCC) Chronic in nature.  Anticoagulated with Eliquis.  Rate control with metoprolol.   Overall stable at this time.  Acute on chronic congestive heart failure (HCC) Recent hospitalization for acute on chronic CHF.  Appears euvolemic on exam today.  He will continue with furosemide at current strength.  Limit sodium intake.  Chronic respiratory failure (HCC) Related to CHF and COPD.  He does have home oxygen I encouraged him to use this especially at night and with exertion.  We discussed that he can use this as needed at rest if feeling more short of breath or if O2 sats are less than 92%.   No orders of the defined types were placed in this encounter.   No follow-ups on file.    This visit occurred during the SARS-CoV-2 public health emergency.  Safety protocols were in place, including screening questions prior to the visit, additional usage of staff PPE, and extensive  cleaning of exam room while observing appropriate contact time as indicated for disinfecting solutions.

## 2023-06-05 NOTE — Assessment & Plan Note (Signed)
 Related to CHF and COPD.  He does have home oxygen I encouraged him to use this especially at night and with exertion.  We discussed that he can use this as needed at rest if feeling more short of breath or if O2 sats are less than 92%.

## 2023-06-05 NOTE — Patient Instructions (Signed)
 Use oxygen as needed at rest during the day. Wear oxygen if you feel short of breath or if you check your oxygen levels and they are below 92%.  Wear oxygen if you are up and moving around a lot.   Wear oxygen at bedtime every night.

## 2023-06-06 ENCOUNTER — Other Ambulatory Visit: Payer: Self-pay

## 2023-06-06 NOTE — Patient Outreach (Signed)
 Care Management  Transitions of Care Program Transitions of Care Post-discharge week 2   06/06/2023 Name: Ryan Bernard MRN: [id number] DOB: 01-Jan-1932  Subjective: Ryan Bernard is a 89 y.o. year old male who is a primary care patient of Everrett Coombe, DO. The Care Management team Engaged with patient Engaged with patient by telephone to assess and address transitions of care needs.   Consent to Services:  Patient was given information about care management services, agreed to services, and gave verbal consent to participate.   Assessment:     Patient states he continues to improve-saw PCP and cardiology. Patient states he [facility] with [facility] who told him to continue the Methylprednisone until gone - Patient states his weight is holding at 130lbs and he continues with oxygen. Reinforced importance of daily weights and when to call MD. Patient states he feels he is doing well and agreed to continued TOC follow up calls.       SDOH Interventions    Flowsheet Row Telephone from 05/30/2023 in [place] POPULATION HEALTH DEPARTMENT Clinical Support from 05/20/2023 in [facility] Primary Care & Sports Medicine at [facility] Office Visit from 12/17/2021 in [facility] Primary Care & Sports Medicine at [facility] Office Visit from 04/23/2021 in [facility] Primary Care & Sports Medicine at [facility]  SDOH Interventions      Food Insecurity Interventions Intervention Not Indicated Intervention Not Indicated -- Intervention Not Indicated  Housing Interventions Intervention Not Indicated Intervention Not Indicated -- Intervention Not Indicated  Transportation Interventions Intervention Not Indicated Intervention Not Indicated -- Intervention Not Indicated  Utilities Interventions Intervention Not Indicated -- -- --  Alcohol Usage Interventions -- Intervention Not Indicated (Score <7) -- --  Depression Interventions/Treatment  -- -- Counseling --   Financial Strain Interventions -- Intervention Not Indicated -- Intervention Not Indicated  Physical Activity Interventions -- Intervention Not Indicated -- Intervention Not Indicated  Stress Interventions -- Intervention Not Indicated -- Intervention Not Indicated  Social Connections Interventions -- Intervention Not Indicated -- Intervention Not Indicated  Health Literacy Interventions -- Intervention Not Indicated -- --        Goals Addressed               This Visit's Progress     TOC Care Plan - Patient will report no readmissions in the next 30 days (pt-stated)        Current Barriers:  Equipment/DME Son/Patient unsure about who provided oxygen and how to obtain extra supplies  (06/06/23 patient states he has the supplies he needs from [employer])  RNCM Clinical Goal(s):  Patient will work with the Care Management team over the next 30 days to address Transition of Care Barriers: Equipment/DME Patient will take all medications exactly as prescribed and will call provider for medication related questions as evidenced by patient report and health record Patient will demonstrate understanding of rationale for each prescribed medication as evidenced by patient/son verbalization Patient will attend all scheduled medical appointments: PCP hospital follow up scheduled for 06/05/23 during [facility] RN call - cardiology appointment scheduled 06/05/23 - (06/06/23 appointments completed) Son will work with [employer] to obtain any needed oxygen supplies  (Patient states he has all the oxygen supplies he needs) Patient will manage CHF symptoms and report any issues/concerns immediately  Interventions: Evaluation of current treatment plan related to  self management and patient's adherence to plan as established by provider   Transitions of Care:  Goal on track:  Yes. Durable Medical Equipment (  DME) needs assessed with patient/caregiver and reviewed with patient/caregiver ([employer] for oxygen - number provided)   Doctor Visits  - discussed the importance of doctor visits - PCP hospital follow up scheduled with Care guide during call 06/05/23 - completed  Reviewed patient has Life Alert necklace - son lives with patient and provides assistance  Heart Failure Interventions:  (Status:  Goal on track:  Yes.) Short Term Goal Basic overview and discussion of pathophysiology of Heart Failure reviewed Provided education on low sodium diet Advised patient to weigh each morning after emptying bladder Discussed importance of daily weight and advised patient to weigh and record daily - patient reports was 140lbs on admission and is 130lbs today (06/06/23 patient reports weight again 130lbs)  Patient Goals/Self-Care Activities: Participate in Transition of Care Program/Attend TOC scheduled calls Notify RN Care Manager of TOC call rescheduling needs Take all medications as prescribed Attend all scheduled provider appointments call office if I gain more than 2 pounds in one day or 5 pounds in one week use salt in moderation watch for swelling in feet, ankles and legs every day weigh myself daily  Follow Up Plan:  Telephone follow up appointment with care management team member scheduled for:  06/13/23 2pm The patient has been provided with contact information for the care management team and has been advised to call with any health related questions or concerns.          Plan: Telephone follow up appointment with care management team member scheduled for: 06/13/23 2pm The patient has been provided with contact information for the care management team and has been advised to call with any health related questions or concerns.   Hilbert Odor RN, CCM [place]  VBCI-Population Health RN Care Manager [phone]

## 2023-06-13 ENCOUNTER — Other Ambulatory Visit: Payer: Self-pay

## 2023-06-13 NOTE — Patient Outreach (Signed)
 Care Management  Transitions of Care Program Transitions of Care Post-discharge week 3   06/13/2023 Name: Ryan Bernard MRN: [id number] DOB: 1931-07-15  Subjective: Ryan Bernard is a 89 y.o. year old male who is a primary care patient of Everrett Coombe, DO. The Care Management team Engaged with patient Engaged with patient by telephone to assess and address transitions of care needs.   Consent to Services:  Patient was given information about care management services, agreed to services, and gave verbal consent to participate.   Assessment:     Patient reported he was doing well and attempted to check blood sugar but was unsuccessful after multiple attempts and states he was unable to get a reading. Patient's son, Ryan Bernard came home during call and offered assistance and walked patient through how to properly check his sugar and reported sugar of 207. Son also reported BP 130/97 and reports patient had placed cuff on his elbow so son assisted patient on properly applying BP cuff. Patient confirmed he is using his O2 at 2 liters and son reported O2 sat at 96%.Patient confirmed he has appointment with PCP 06/18/23. TOC will follow up again 06/19/23 and encouraged son to make sure any old test strips/lancets are disposed of and patient only has the new ones to use and continue to work with patient so he can check his sugar independently. Son agreeable.       SDOH Interventions    Flowsheet Row Telephone from 05/30/2023 in [facility] Clinical Support from 05/20/2023 in [facility] Primary Care & Sports Medicine at [facility] Office Visit from 12/17/2021 in [facility] Primary Care & Sports Medicine at [facility] Office Visit from 04/23/2021 in [facility] Primary Care & Sports Medicine at [facility]  SDOH Interventions      Food Insecurity Interventions Intervention Not Indicated Intervention Not Indicated -- Intervention Not Indicated  Housing  Interventions Intervention Not Indicated Intervention Not Indicated -- Intervention Not Indicated  Transportation Interventions Intervention Not Indicated Intervention Not Indicated -- Intervention Not Indicated  Utilities Interventions Intervention Not Indicated -- -- --  Alcohol Usage Interventions -- Intervention Not Indicated (Score <7) -- --  Depression Interventions/Treatment  -- -- Counseling --  Financial Strain Interventions -- Intervention Not Indicated -- Intervention Not Indicated  Physical Activity Interventions -- Intervention Not Indicated -- Intervention Not Indicated  Stress Interventions -- Intervention Not Indicated -- Intervention Not Indicated  Social Connections Interventions -- Intervention Not Indicated -- Intervention Not Indicated  Health Literacy Interventions -- Intervention Not Indicated -- --        Goals Addressed               This Visit's Progress     TOC Care Plan - Patient will report no readmissions in the next 30 days (pt-stated)        Current Barriers:  Equipment/DME Son/Patient unsure about who provided oxygen and how to obtain extra supplies  (06/06/23 patient states he has the supplies he needs from [employer]) - Addressed  RNCM Clinical Goal(s):  Patient will work with the Care Management team over the next 30 days to address Transition of Care Barriers: Equipment/DME Patient will take all medications exactly as prescribed and will call provider for medication related questions as evidenced by patient report and health record Patient will demonstrate understanding of rationale for each prescribed medication as evidenced by patient/son verbalization Patient will attend all scheduled medical appointments: PCP hospital follow up scheduled for 06/05/23 during [facility] RN  call - cardiology appointment scheduled 06/05/23 - (06/06/23 appointments completed) Son will work with [employer] to obtain any needed oxygen supplies  (Patient states he has all the oxygen  supplies he needs) Patient will manage CHF symptoms and report any issues/concerns immediately  Interventions: Evaluation of current treatment plan related to  self management and patient's adherence to plan as established by provider   Transitions of Care:  Goal on track:  Yes. Durable Medical Equipment (DME) needs assessed with patient/caregiver and reviewed with patient/caregiver ([employer] for oxygen - number provided)  (06/13/23 reports O2 2 liters 95%) Doctor Visits  - discussed the importance of doctor visits - PCP hospital follow up scheduled with Care guide during call 06/05/23 - completed - Next appointment 06/18/23 Reviewed patient has Life Alert necklace - son lives with patient and provides assistance  Heart Failure Interventions:  (Status:  Goal on track:  Yes.) Short Term Goal Basic overview and discussion of pathophysiology of Heart Failure reviewed Provided education on low sodium diet Advised patient to weigh each morning after emptying bladder Discussed importance of daily weight and advised patient to weigh and record daily - patient reports was 140lbs on admission and is 130lbs today (06/06/23 patient reports weight again 130lbs)   Patient Goals/Self-Care Activities: Participate in Transition of Care Program/Attend [facility] scheduled calls Notify RN Care Manager of TOC call rescheduling needs Take all medications as prescribed Attend all scheduled provider appointments call office if I gain more than 2 pounds in one day or 5 pounds in one week use salt in moderation watch for swelling in feet, ankles and legs every day weigh myself daily 06/13/23 lengthy conversation with patient and son, Ryan Bernard walking through checking sugar - reports 207 after multiple attempts - walk through checking BP and son instructing patient on proper method and reports 130/63 HR 97  Follow Up Plan:  Telephone follow up appointment with care management team member scheduled for:  06/19/23 11am The patient has  been provided with contact information for the care management team and has been advised to call with any health related questions or concerns.          Plan: Telephone follow up appointment with care management team member scheduled for: 06/19/23 11AM The patient has been provided with contact information for the care management team and has been advised to call with any health related questions or concerns.   Hilbert Odor RN, CCM [place]  VBCI-Population Health RN Care Manager [phone]

## 2023-06-13 NOTE — Patient Instructions (Signed)
 Visit Information  Thank you for taking time to visit with me today. Please don't hesitate to contact me if I can be of assistance to you before our next scheduled telephone appointment.  Our next appointment is by telephone on 06/19/23 at 11am  Following is a copy of your care plan:   Goals Addressed               This Visit's Progress     TOC Care Plan - Patient will report no readmissions in the next 30 days (pt-stated)        Current Barriers:  Equipment/DME Son/Patient unsure about who provided oxygen and how to obtain extra supplies  (06/06/23 patient states he has the supplies he needs from [employer]) - Addressed  RNCM Clinical Goal(s):  Patient will work with the Care Management team over the next 30 days to address Transition of Care Barriers: Equipment/DME Patient will take all medications exactly as prescribed and will call provider for medication related questions as evidenced by patient report and health record Patient will demonstrate understanding of rationale for each prescribed medication as evidenced by patient/son verbalization Patient will attend all scheduled medical appointments: PCP hospital follow up scheduled for 06/05/23 during [facility] RN call - cardiology appointment scheduled 06/05/23 - (06/06/23 appointments completed) Son will work with [employer] to obtain any needed oxygen supplies  (Patient states he has all the oxygen supplies he needs) Patient will manage CHF symptoms and report any issues/concerns immediately  Interventions: Evaluation of current treatment plan related to  self management and patient's adherence to plan as established by provider   Transitions of Care:  Goal on track:  Yes. Durable Medical Equipment (DME) needs assessed with patient/caregiver and reviewed with patient/caregiver ([employer] for oxygen - number provided)  (06/13/23 reports O2 2 liters 95%) Doctor Visits  - discussed the importance of doctor visits - PCP hospital follow up scheduled with Care  guide during call 06/05/23 - completed - Next appointment 06/18/23 Reviewed patient has Life Alert necklace - son lives with patient and provides assistance  Heart Failure Interventions:  (Status:  Goal on track:  Yes.) Short Term Goal Basic overview and discussion of pathophysiology of Heart Failure reviewed Provided education on low sodium diet Advised patient to weigh each morning after emptying bladder Discussed importance of daily weight and advised patient to weigh and record daily - patient reports was 140lbs on admission and is 130lbs today (06/06/23 patient reports weight again 130lbs)   Patient Goals/Self-Care Activities: Participate in Transition of Care Program/Attend [facility] scheduled calls Notify RN Care Manager of TOC call rescheduling needs Take all medications as prescribed Attend all scheduled provider appointments call office if I gain more than 2 pounds in one day or 5 pounds in one week use salt in moderation watch for swelling in feet, ankles and legs every day weigh myself daily 06/13/23 lengthy conversation with patient and son, Georga Hacking walking through checking sugar - reports 207 after multiple attempts - walk through checking BP and son instructing patient on proper method and reports 130/63 HR 97  Follow Up Plan:  Telephone follow up appointment with care management team member scheduled for:  06/19/23 11am The patient has been provided with contact information for the care management team and has been advised to call with any health related questions or concerns.          Patient verbalizes understanding of instructions and care plan provided today and agrees to view in MyChart. Active MyChart status and  patient understanding of how to access instructions and care plan via MyChart confirmed with patient.     Telephone follow up appointment with care management team member scheduled for:06/19/23 11am The patient has been provided with contact information for the care  management team and has been advised to call with any health related questions or concerns.   Please call the care guide team at [phone] if you need to cancel or reschedule your appointment.   Please call the Suicide and Crisis Lifeline: 988 call the [place] National Suicide Prevention Lifeline: 787-242-8818 or TTY: (317) 750-2904 TTY 779 162 5960) to talk to a trained counselor call 1-800-273-TALK (toll free, 24 hour hotline) call 911 if you are experiencing a Mental Health or Behavioral Health Crisis or need someone to talk to.  Hilbert Odor RN, CCM [place]  VBCI-Population Health RN Care Manager [phone]

## 2023-06-18 ENCOUNTER — Ambulatory Visit (INDEPENDENT_AMBULATORY_CARE_PROVIDER_SITE_OTHER): Payer: Medicare PPO | Admitting: Family Medicine

## 2023-06-18 ENCOUNTER — Encounter: Payer: Self-pay | Admitting: Family Medicine

## 2023-06-18 DIAGNOSIS — I10 Essential (primary) hypertension: Secondary | ICD-10-CM

## 2023-06-18 DIAGNOSIS — I4891 Unspecified atrial fibrillation: Secondary | ICD-10-CM

## 2023-06-18 DIAGNOSIS — J9611 Chronic respiratory failure with hypoxia: Secondary | ICD-10-CM

## 2023-06-18 DIAGNOSIS — E119 Type 2 diabetes mellitus without complications: Secondary | ICD-10-CM | POA: Diagnosis not present

## 2023-06-18 MED ORDER — AMBULATORY NON FORMULARY MEDICATION: 0 refills | Status: DC

## 2023-06-18 NOTE — Assessment & Plan Note (Signed)
 Chronic in nature.  Anticoagulated with Eliquis.  Rate control with metoprolol.  Overall stable at this time.

## 2023-06-18 NOTE — Assessment & Plan Note (Signed)
 Most recent A1c at 7.7%.  Continue Farxiga at current strength.

## 2023-06-18 NOTE — Assessment & Plan Note (Signed)
 Overall blood pressure stable.  Will plan to continue current medications.

## 2023-06-18 NOTE — Assessment & Plan Note (Signed)
 O2 saturations decreased to 82% on room air with ambulation.  Improved to 97% on 2 L nasal cannula.  He has difficulty with large oxygen tank and I have prescribed portable oxygen concentrator.  Continue current inhalers.

## 2023-06-18 NOTE — Progress Notes (Signed)
 Ryan Bernard - 89 y.o. male MRN [id number]  Date of birth: 1931-10-09  Subjective No chief complaint on file.   HPI Ryan Bernard is a 89 y.o. male here today for follow-up visit.  He reports that he is doing well at this time.  He continues on oxygen and is using this most of the time throughout the day as well as at night.  He has tried going without this but desaturates into the low 80s with activity.  He denies dyspnea, chest pain, palpitations or extremities.  He was last A1c during hospitalization was 7.7%.  Continues on Farxiga.  No side effects to current strength.  He was on steroids recently and reports that blood sugars have improved since coming off of this and are typically around 120.  ROS:  A comprehensive ROS was completed and negative except as noted per HPI  No Known Allergies  Past Medical History:  Diagnosis Date   Anemia    Arthritis    BPH (benign prostatic hyperplasia)    CAD (coronary artery disease) 03/11/1993   w/ angioplasty   CHF (congestive heart failure) (HCC)    Chronic kidney disease    Chronic obstructive pulmonary disease (HCC) 12/17/2021   Chronic obstructive pulmonary disease (HCC) 12/17/2021   Diabetes mellitus 03/11/2002   type 2   Heart disease    Mild valvular w/ pulm HTN   Heart murmur    Hyperlipidemia    Hypertension    SBE (subacute bacterial endocarditis) prophylaxis candidate    Well adult exam 12/17/2021    Past Surgical History:  Procedure Laterality Date   ANGIOPLASTY  03/11/1993   [facility]   BACK SURGERY  03/11/1962   CORONARY ARTERY BYPASS GRAFT  10/27/2009   5 vessel, Dr. Raoul Pitch   EYE SURGERY     HERNIA REPAIR     x 2     Social History   Socioeconomic History   Marital status: Widowed    Spouse name: Not on file   Number of children: 2   Years of education: 21   Highest education level: 12th grade  Occupational History   Occupation: retired    Comment: Insurance claims handler  Tobacco Use   Smoking  status: Never   Smokeless tobacco: Never  Vaping Use   Vaping status: Never Used  Substance and Sexual Activity   Alcohol use: No   Drug use: No   Sexual activity: Not Currently  Other Topics Concern   Not on file  Social History Narrative   Lives with his son. He has two children. Sits around house and watches TV and walks in the yard.   Social Drivers of Corporate investment banker Strain: Low Risk  (05/20/2023)   Overall Financial Resource Strain (CARDIA)    Difficulty of Paying Living Expenses: Not hard at all  Food Insecurity: No Food Insecurity (05/30/2023)   Hunger Vital Sign    Worried About Running Out of Food in the Last Year: Never true    Ran Out of Food in the Last Year: Never true  Transportation Needs: No Transportation Needs (05/30/2023)   PRAPARE - Administrator, Civil Service (Medical): No    Lack of Transportation (Non-Medical): No  Physical Activity: Sufficiently Active (05/20/2023)   Exercise Vital Sign    Days of Exercise per Week: 6 days    Minutes of Exercise per Session: 40 min  Stress: No Stress Concern Present (05/28/2023)   Received from [facility]  [name] of Occupational Health - Occupational Stress Questionnaire    Feeling of Stress : Only a little  Social Connections: Moderately Integrated (05/20/2023)   Social Connection and Isolation Panel [NHANES]    Frequency of Communication with Friends and Family: More than three times a week    Frequency of Social Gatherings with Friends and Family: More than three times a week    Attends Religious Services: More than 4 times per year    Active Member of [employer] or Organizations: Yes    Attends Banker Meetings: More than 4 times per year    Marital Status: Widowed    Family History  Problem Relation Age of Onset   Heart disease Mother    Hypertension Mother    Alcohol abuse Father    Depression Daughter    Depression Son     Health Maintenance  Topic Date Due    OPHTHALMOLOGY EXAM  07/12/2022   COVID-19 Vaccine (8 - Mixed Product risk 2024-25 season) 06/18/2023   FOOT EXAM  06/18/2023   HEMOGLOBIN A1C  06/18/2023   INFLUENZA VACCINE  10/10/2023   Medicare Annual Wellness (AWV)  05/19/2024   DTaP/Tdap/Td (4 - Td or Tdap) 10/09/2025   Pneumonia Vaccine 41+ Years old  Completed   Zoster Vaccines- Shingrix  Completed   HPV VACCINES  Aged Out     ----------------------------------------------------------------------------------------------------------------------------------------------------------------------------------------------------------------- Physical Exam There were no vitals taken for this visit.  Physical Exam Constitutional:      Appearance: Normal appearance.  Eyes:     General: No scleral icterus. Cardiovascular:     Rate and Rhythm: Normal rate and regular rhythm.  Pulmonary:     Effort: Pulmonary effort is normal.     Breath sounds: Normal breath sounds.  Musculoskeletal:     Cervical back: Neck supple.  Neurological:     Mental Status: He is alert.  Psychiatric:        Mood and Affect: Mood normal.        Behavior: Behavior normal.     ------------------------------------------------------------------------------------------------------------------------------------------------------------------------------------------------------------------- Assessment and Plan  Chronic respiratory failure (HCC) O2 saturations decreased to 82% on room air with ambulation.  Improved to 97% on 2 L nasal cannula.  He has difficulty with large oxygen tank and I have prescribed portable oxygen concentrator.  Continue current inhalers.  Atrial fibrillation (HCC) Chronic in nature.  Anticoagulated with Eliquis.  Rate control with metoprolol.  Overall stable at this time.  Essential hypertension, benign Overall blood pressure stable.  Will plan to continue current medications.  Controlled type 2 diabetes mellitus without  complication (HCC) Most recent A1c at 7.7%.  Continue Farxiga at current strength.   Meds ordered this encounter  Medications   AMBULATORY NON FORMULARY MEDICATION    Sig: Portable Oxygen concentrator  Titrate for POC 2 L [place] start  Sat walking on room air 82% sat walking with 2L [place] 97% Dispense: 1 each, Refills: 0 ordered    Dispense:  1 Device    Refill:  0    Return in about 6 months (around 12/18/2023) for Hypertension, Type 2 Diabetes.

## 2023-06-19 ENCOUNTER — Other Ambulatory Visit: Payer: Self-pay

## 2023-06-19 NOTE — Transitions of Care (Post Inpatient/ED Visit) (Signed)
 Care Management  Transitions of Care Program Transitions of Care Post-discharge week 4  06/19/2023 Name: Ryan Bernard MRN: [id number] DOB: 06/25/1931  Subjective: Ryan Bernard is a 89 y.o. year old male who is a primary care patient of Everrett Coombe, DO. The Care Management team spoke with patient by telephone to assess and address transitions of care needs. Patient stated he is leaving for V.A. appointment and requested to reschedule.   Plan: 06/19/23 TOC Week #4 visit rescheduled for 06/20/23 11am per patient request.   Hilbert Odor RN, CCM [place]  VBCI-Population Health RN Care Manager [phone]

## 2023-06-20 ENCOUNTER — Telehealth: Payer: Self-pay | Admitting: Family Medicine

## 2023-06-20 ENCOUNTER — Other Ambulatory Visit: Payer: Self-pay

## 2023-06-20 NOTE — Telephone Encounter (Signed)
 Copied from CRM [id number]. Topic: General - Other >> Jun 20, 2023  1:03 PM Antony Haste wrote: Reason for CRM: The patient's daughter, [name] contacted [employer] the supplier for the order of DME: Portable Oxygen concentrator  Titrate for POC 2 L [place] start. She states that they have not received the order yet, and that once it has been faxed over he will be able to complete the - 6 minute walk test. Fax # to [facility]: [phone]

## 2023-06-20 NOTE — Transitions of Care (Post Inpatient/ED Visit) (Signed)
 Transition of Care week 4  Visit Note  06/20/2023  Name: Ryan Bernard MRN: [id number]          DOB: 03/07/1932  Situation: Patient enrolled in [facility] 30-day program. Visit completed with patient by telephone.   Background:TOC RN following patient who discharged from [facility] 05/29/23 with diagnosis of systolic CHF acute on Chronic. Son, [place] and Daughter, Kennon Portela present today for call. Patient seen at [facility] and denied any medication changes. Patient feels he is doing well and is checking weight, BP, O2 Sat on 2 liters, blood sugar and reports all good findings. See below for further detail of visit.   Initial Transition Care Management Follow-up Telephone Call    Past Medical History:  Diagnosis Date   Anemia    Arthritis    BPH (benign prostatic hyperplasia)    CAD (coronary artery disease) 03/11/1993   w/ angioplasty   CHF (congestive heart failure) (HCC)    Chronic kidney disease    Chronic obstructive pulmonary disease (HCC) 12/17/2021   Chronic obstructive pulmonary disease (HCC) 12/17/2021   Diabetes mellitus 03/11/2002   type 2   Heart disease    Mild valvular w/ pulm HTN   Heart murmur    Hyperlipidemia    Hypertension    SBE (subacute bacterial endocarditis) prophylaxis candidate    Well adult exam 12/17/2021    Assessment: Patient Reported Symptoms:  Cognitive Alert and oriented to person, place, and time, Normal speech and language skills  Neurological No symptoms reported    HEENT Change or loss of hearing, Other: Patient states he has partials and has some hearing loss - states his children tell him he needs hearing checked and saw  dentist 6 months ago and goes every 6 months - had bilateral cataract surgery - wears reading glasses - has eye exams yearly  Cardiovascular No symptoms reported    Respiratory   Daugther reports 7 years ago patient had flu, pneumonia, bronchitis and his breathing hasn't been the same since  Endocrine No  symptoms reported    Gastrointestinal No symptoms reported    Genitourinary No symptoms reported    Integumentary No symptoms reported    Musculoskeletal No symptoms reported    Psychosocial No symptoms reported     Vitals:   06/20/23 1118  BP: 115/70  Pulse: 70  SpO2: 95%    Medications Reviewed Today     Reviewed by Jessy Oto, RN (Registered Nurse) on 06/20/23 at 1147  Med List Status: <None>   Medication Order Taking? Sig Documenting Provider Last Dose Status Informant  albuterol (PROVENTIL HFA;VENTOLIN HFA) 108 (90 Base) MCG/ACT inhaler [id number] Yes Inhale 2 puffs into the lungs every 6 (six) hours as needed for wheezing. Sunnie Nielsen, DO Taking Active   AMBULATORY NON FORMULARY MEDICATION [id number] No Portable Oxygen concentrator  Titrate for POC 2 L [place] start  Sat walking on room air 82% sat walking with 2L [place] 97% Dispense: 1 each, Refills: 0 ordered  Patient not taking: Reported on 06/20/2023   Everrett Coombe, DO Not Taking Active            Med Note [facility], Ladonne Sharples A   Fri Jun 20, 2023 11:03 AM) Shari Heritage, Georga Hacking states this is ordered but not present - has the green tanks  apixaban (ELIQUIS) 5 MG TABS tablet [id number] Yes Take 5 mg by mouth 2 (two) times daily. [provider] Taking Active   dapagliflozin propanediol (FARXIGA) 10 MG TABS tablet [id number] Yes  Take 1 tablet by mouth daily. [provider] Taking Active   furosemide (LASIX) 20 MG tablet [id number] Yes Take 1 tablet (20 mg total) by mouth daily. Sunnie Nielsen, DO Taking Active            Med Note [facility], Calen Posch A   Fri Jun 20, 2023 11:47 AM) Patient taking daily since 05/29/23 discharge   ipratropium-albuterol (DUONEB) 0.5-2.5 (3) MG/3ML SOLN [id number] Yes Take 3 mLs by nebulization every 6 (six) hours as needed. [provider] Taking Active   metoprolol succinate (TOPROL-XL) 100 MG 24 hr tablet [id number] Yes Take 100 mg by mouth daily. Take with or immediately  following a meal. [provider] Taking Active   pravastatin (PRAVACHOL) 80 MG tablet [id number] Yes Take 1 tablet (80 mg total) by mouth at bedtime. Everrett Coombe, DO Taking Active   spironolactone (ALDACTONE) 25 MG tablet [id number] Yes Take 1 tablet by mouth daily. [provider] Taking Active   terazosin (HYTRIN) 10 MG capsule [id number] Yes Take 10 mg by mouth daily.   [provider] Taking Active             Goals Addressed               This Visit's Progress     COMPLETED: TOC Care Plan - Patient will report no readmissions in the next 30 days (pt-stated)        Current Barriers:  Equipment/DME Son/Patient unsure about who provided oxygen and how to obtain extra supplies  (06/06/23 patient states he has the supplies he needs from [employer]) - Addressed  RNCM Clinical Goal(s):  Patient will work with the Care Management team over the next 30 days to address Transition of Care Barriers: Equipment/DME Patient will take all medications exactly as prescribed and will call provider for medication related questions as evidenced by patient report and health record Patient will demonstrate understanding of rationale for each prescribed medication as evidenced by patient/son verbalization Patient will attend all scheduled medical appointments: PCP hospital follow up scheduled for 06/05/23 during [facility] RN call - cardiology appointment scheduled 06/05/23 - (06/06/23 appointments completed) Son will work with [employer] to obtain any needed oxygen supplies  (Patient states he has all the oxygen supplies he needs) Patient will manage CHF symptoms and report any issues/concerns immediately  Interventions: Evaluation of current treatment plan related to  self management and patient's adherence to plan as established by provider   Transitions of Care:  Goal on track:  Yes. Durable Medical Equipment (DME) needs assessed with patient/caregiver and reviewed with patient/caregiver  ([employer] for oxygen - number provided)  (06/13/23 reports O2 2 liters 95%) Doctor Visits  - discussed the importance of doctor visits - PCP hospital follow up scheduled with Care guide during call 06/05/23 - completed - Next appointment 06/18/23 Reviewed patient has Life Alert necklace - son lives with patient and provides assistance  Heart Failure Interventions:  (Status:  Goal on track:  Yes.) Short Term Goal Basic overview and discussion of pathophysiology of Heart Failure reviewed Provided education on low sodium diet Advised patient to weigh each morning after emptying bladder Discussed importance of daily weight and advised patient to weigh and record daily - patient reports was 140lbs on admission and is 130lbs today (06/06/23 patient reports weight again 130lbs)   Patient Goals/Self-Care Activities: Participate in Transition of Care Program/Attend [facility] scheduled calls Notify RN Care Manager of TOC call rescheduling needs Take all medications as prescribed Attend all scheduled provider  appointments call office if I gain more than 2 pounds in one day or 5 pounds in one week use salt in moderation watch for swelling in feet, ankles and legs every day weigh myself daily 06/13/23 lengthy conversation with patient and son, Georga Hacking walking through checking sugar - reports 207 after multiple attempts - walk through checking BP and son instructing patient on proper method and reports 130/63 HR 97  Follow Up Plan:  Telephone follow up appointment with care management team member scheduled for:  06/19/23 11am The patient has been provided with contact information for the care management team and has been advised to call with any health related questions or concerns.        [facility] Transitions of Care (TOC) Care Plan        Problems:  Recent Hospitalization for treatment of  Systolic CHF, Acute on Chronic Equipment/DME: Son/Patient had been unaware of oxygen provider and how to obtain supplies and how to obtain  portable oxygen concentrator  Goal:  Over the next 30 days, the patient will not experience hospital readmission  Interventions:  Transitions of Care: Doctor Visits  - discussed the importance of doctor visits  AFIB Interventions:   Reviewed importance of adherence to anticoagulant exactly as prescribed Counseled on bleeding risk associated with Eliquis and importance of self-monitoring for signs/symptoms of bleeding   Heart Failure Interventions: Basic overview and discussion of pathophysiology of Heart Failure reviewed Provided education on low sodium diet Provided education about placing scale on hard, flat surface Advised patient to weigh each morning after emptying bladder Discussed importance of daily weight and advised patient to weigh and record daily   Chronic Kidney Disease Interventions: Reviewed medications with patient and discussed importance of compliance    Advised patient, providing education and rationale, to monitor blood pressure daily and record, calling PCP for findings outside established parameters    Discussed plans with patient for ongoing care management follow up and provided patient with direct contact information for care management team    Provided education on kidney disease progression    Last practice recorded BP readings:  BP Readings from Last 3 Encounters:  06/20/23 115/70  06/19/23 125/68  06/05/23 121/64   Most recent eGFR/CrCl:  Lab Results  Component Value Date   EGFR 66 05/08/2023    No components found for: "CRCL"    COVID Interventions: Provided education to patient to enhance basic understanding of COVID-19 as a viral disease, measures to prevent exposure, signs and symptoms, recommended vaccine schedule, when to contact provider Provided FDA vaccine guidelines   Diabetes Interventions: Assessed patient's understanding of A1c goal: <6.5% Provided education to patient about basic DM disease process Reviewed medications with  patient and discussed importance of medication adherence Lab Results  Component Value Date   HGBA1C 6.4 12/18/2022    Falls Interventions: Advised patient of importance of notifying provider of falls Assessed for falls since last encounter Provided patient information for fall alert systems - has a life alert system in place  Patient Self Care Activities:  Attend all scheduled provider appointments Call pharmacy for medication refills 3-7 days in advance of running out of medications Call provider office for new concerns or questions  Notify RN Care Manager of TOC call rescheduling needs Participate in Transition of Care Program/Attend TOC scheduled calls Take medications as prescribed   call office if I gain more than 2 pounds in one day or 5 pounds in one week use salt in moderation watch for swelling  in feet, ankles and legs every day weigh myself daily keep appointment with eye doctor check feet daily for cuts, sores or redness trim toenails straight across - sees a podiatrist    Plan:  Telephone follow up appointment with care management team member scheduled for:  06/27/23 10am The patient has been provided with contact information for the care management team and has been advised to call with any health related questions or concerns.              Recommendation:   Ongoing TOC follow up  Follow Up Plan:   Telephone follow up appointment date/time:  06/27/23 10am  Hilbert Odor RN, CCM [place]  VBC[facility]-Population Health RN Care Manager [phone]

## 2023-06-27 ENCOUNTER — Telehealth: Payer: Self-pay

## 2023-06-27 DIAGNOSIS — I509 Heart failure, unspecified: Secondary | ICD-10-CM

## 2023-06-27 NOTE — Transitions of Care (Post Inpatient/ED Visit) (Signed)
 Transition of Care  Week 5 Program Closure  Visit Note  06/27/2023  Name: Ryan Bernard MRN: [id number]          DOB: Dec 17, 1931  Situation: Patient enrolled in [facility] 30-day program. Visit completed with Patient by telephone.   Background: TOC RN following patient who discharged from [facility] 05/29/23 with diagnosis of systolic CHF acute on Chronic. Patient feels he continues to do well and is checking weight, BP, O2 Sat on 2 liters, blood sugar and reports all good findings. See below for further detail of visit. Call was made to [employer] today with patient, spoke with Summer who advised they did not receive the order for POC and message was sent to Dr [name] requesting to re-send order. Patient agreeable to ongoing follow up by CCM. Referral completed.     Initial Transition Care Management Follow-up Telephone Call    Past Medical History:  Diagnosis Date   Anemia    Arthritis    BPH (benign prostatic hyperplasia)    CAD (coronary artery disease) 03/11/1993   w/ angioplasty   CHF (congestive heart failure) (HCC)    Chronic kidney disease    Chronic obstructive pulmonary disease (HCC) 12/17/2021   Chronic obstructive pulmonary disease (HCC) 12/17/2021   Diabetes mellitus 03/11/2002   type 2   Heart disease    Mild valvular w/ pulm HTN   Heart murmur    Hyperlipidemia    Hypertension    SBE (subacute bacterial endocarditis) prophylaxis candidate    Well adult exam 12/17/2021    Assessment: Patient Reported Symptoms: Cognitive Cognitive Status: Alert and oriented to person, place, and time, Normal speech and language skills      Neurological Neurological Review of Symptoms: No symptoms reported    HEENT        Cardiovascular Cardiovascular Symptoms Reported: No symptoms reported Weight: 131 lb (59.4 kg)  Respiratory Respiratory Symptoms Reported: No symptoms reported Respiratory Comment: patient states he has on oxygen and O2 saturation is 95%   Endocrine Patient reports the following symptoms related to hypoglycemia or hyperglycemia : No symptoms reported Is patient diabetic?: Yes Is patient checking blood sugars at home?: Yes    Gastrointestinal Gastrointestinal Symptoms Reported: No symptoms reported      Genitourinary Genitourinary Symptoms Reported: No symptoms reported    Integumentary Integumentary Symptoms Reported: No symptoms reported    Musculoskeletal Musculoskelatal Symptoms Reviewed: No symptoms reported Additional Musculoskeletal Details: patient denies fall since last TOC call        Psychosocial Psychosocial Symptoms Reported: No symptoms reported         Vitals:   06/27/23 1021  BP: 118/73    Medications Reviewed Today     Reviewed by [name], RN (Registered Nurse) on 06/27/23 at 1014  Med List Status: <None>   Medication Order Taking? Sig Documenting Provider Last Dose Status Informant  albuterol  (PROVENTIL  HFA;VENTOLIN  HFA) 108 (90 Base) MCG/ACT inhaler [id number] Yes Inhale 2 puffs into the lungs every 6 (six) hours as needed for wheezing. [name], DO Taking Active   AMBULATORY NON FORMULARY MEDICATION [id number] No Portable Oxygen concentrator  Titrate for POC 2 L [place] start  Sat walking on room air 82% sat walking with 2L [place] 97% Dispense: 1 each, Refills: 0 ordered  Patient not taking: Reported on 06/27/2023   [name], DO Not Taking Active            Med Note [facility], TRUE Garciamartinez A   Fri Jun 20, 2023 11:03 AM) Son, [name] states this is ordered but not present - has the green tanks  apixaban  (ELIQUIS ) 5 MG TABS tablet [id number] Yes Take 5 mg by mouth 2 (two) times daily. [provider] Taking Active   dapagliflozin propanediol (FARXIGA) 10 MG TABS tablet [id number] Yes Take 1 tablet by mouth daily. [provider] Taking Active   furosemide  (LASIX ) 20 MG tablet [id number] Yes Take 1 tablet (20 mg total) by mouth daily. [name], [name], DO Taking Active             Med Note [facility], Eean Buss A   Fri Jun 20, 2023 11:47 AM) Patient taking daily since 05/29/23 discharge   ipratropium-albuterol  (DUONEB) 0.5-2.5 (3) MG/3ML SOLN [id number] Yes Take 3 mLs by nebulization every 6 (six) hours as needed. [provider] Taking Active   metoprolol  succinate (TOPROL -XL) 100 MG 24 hr tablet [id number] Yes Take 100 mg by mouth daily. Take with or immediately following a meal. [provider] Taking Active   pravastatin  (PRAVACHOL ) 80 MG tablet [id number] Yes Take 1 tablet (80 mg total) by mouth at bedtime. [name], DO Taking Active   spironolactone (ALDACTONE) 25 MG tablet [id number] Yes Take 1 tablet by mouth daily. [provider] Taking Active   terazosin (HYTRIN) 10 MG capsule [id number] Yes Take 10 mg by mouth daily.   [provider] Taking Active             Goals Addressed             This Visit's Progress    COMPLETED: [facility] Transitions of Care (TOC) Care Plan       Problems: TOC Program closure - referral to CCM  Recent Hospitalization for treatment of  Systolic CHF, Acute on Chronic Equipment/DME: Son/Patient had been unaware of oxygen provider and how to obtain supplies and how to obtain portable oxygen concentrator 06/27/23 Conference call to [employer] spoke with Summer - states they have not received order - message sent to Dr [name] requesting order - Patient agreeable to CCM referral for ongoing support - referral completed  Goal: Goal Met  Over the next 30 days, the patient will not experience hospital readmission  Interventions:  Transitions of Care: Doctor Visits  - discussed the importance of doctor visits  AFIB Interventions:   Reviewed importance of adherence to anticoagulant exactly as prescribed Counseled on bleeding risk associated with Eliquis  and importance of self-monitoring for signs/symptoms of bleeding   Heart Failure Interventions: Basic overview and discussion of  pathophysiology of Heart Failure reviewed Provided education on low sodium diet Provided education about placing scale on hard, flat surface Advised patient to weigh each morning after emptying bladder Discussed importance of daily weight and advised patient to weigh and record daily   Chronic Kidney Disease Interventions: Reviewed medications with patient and discussed importance of compliance    Advised patient, providing education and rationale, to monitor blood pressure daily and record, calling PCP for findings outside established parameters    Discussed plans with patient for ongoing care management follow up and provided patient with direct contact information for care management team    Provided education on kidney disease progression    Last practice recorded BP readings:  BP Readings from Last 3 Encounters:  06/20/23 115/70  06/19/23 125/68  06/05/23 121/64   Most recent eGFR/CrCl:  Lab Results  Component Value Date   EGFR 66 05/08/2023    No components found for: "CRCL"    COVID Interventions:  Provided education to patient to enhance basic understanding of COVID-19 as a viral disease, measures to prevent exposure, signs and symptoms, recommended vaccine schedule, when to contact provider Provided FDA vaccine guidelines   Diabetes Interventions: Assessed patient's understanding of A1c goal: <6.5% Provided education to patient about basic DM disease process Reviewed medications with patient and discussed importance of medication adherence Lab Results  Component Value Date   HGBA1C 6.4 12/18/2022    Falls Interventions: Advised patient of importance of notifying provider of falls Assessed for falls since last encounter Provided patient information for fall alert systems - has a life alert system in place  Patient Self Care Activities:  Attend all scheduled provider appointments Call pharmacy for medication refills 3-7 days in advance of running out of  medications Call provider office for new concerns or questions  Notify RN Care Manager of TOC call rescheduling needs Participate in Transition of Care Program/Attend TOC scheduled calls Take medications as prescribed   call office if I gain more than 2 pounds in one day or 5 pounds in one week use salt in moderation watch for swelling in feet, ankles and legs every day weigh myself daily keep appointment with eye doctor check feet daily for cuts, sores or redness trim toenails straight across - sees a podiatrist    Plan: TOC Program closure - referral to CCM   The patient has been provided with contact information for the care management team and has been advised to call with any health related questions or concerns.              Recommendation:   Referral to CCM  Follow Up Plan:   Referral to RN Case Manager  Tonia Frankel RN, CCM   VBCI-Population Health RN Care Manager [phone]

## 2023-06-30 DIAGNOSIS — I482 Chronic atrial fibrillation, unspecified: Secondary | ICD-10-CM | POA: Diagnosis not present

## 2023-06-30 DIAGNOSIS — Z7409 Other reduced mobility: Secondary | ICD-10-CM | POA: Diagnosis not present

## 2023-07-01 ENCOUNTER — Telehealth: Payer: Self-pay | Admitting: *Deleted

## 2023-07-01 NOTE — Progress Notes (Signed)
 Complex Care Management Note  Care Guide Note 07/01/2023 Name: [name] MRN: [id number] DOB: 29-Dec-1931  Ryan Bernard is a 89 y.o. year old male who sees [name], DO for primary care. I reached out to Carrie Civil by phone today to offer complex care management services.  Ryan Bernard was given information about Complex Care Management services today including:   The Complex Care Management services include support from the care team which includes your Nurse Care Manager, Clinical Social Worker, or Pharmacist.  The Complex Care Management team is here to help remove barriers to the health concerns and goals most important to you. Complex Care Management services are voluntary, and the patient may decline or stop services at any time by request to their care team member.   Complex Care Management Consent Status: Patient agreed to services and verbal consent obtained.   Follow up plan:  Telephone appointment with complex care management team member scheduled for:  07/10/2023  Encounter Outcome:  Patient Scheduled  Kandis Ormond, CMA [place]  [facility], [facility] Guide Direct Dial: [phone]  Fax: [phone] Website: [email or web address]

## 2023-07-03 DIAGNOSIS — I350 Nonrheumatic aortic (valve) stenosis: Secondary | ICD-10-CM | POA: Diagnosis not present

## 2023-07-03 DIAGNOSIS — I255 Ischemic cardiomyopathy: Secondary | ICD-10-CM | POA: Diagnosis not present

## 2023-07-03 DIAGNOSIS — I482 Chronic atrial fibrillation, unspecified: Secondary | ICD-10-CM | POA: Diagnosis not present

## 2023-07-03 DIAGNOSIS — I10 Essential (primary) hypertension: Secondary | ICD-10-CM | POA: Diagnosis not present

## 2023-07-07 ENCOUNTER — Ambulatory Visit: Payer: Self-pay

## 2023-07-07 DIAGNOSIS — J449 Chronic obstructive pulmonary disease, unspecified: Secondary | ICD-10-CM | POA: Diagnosis not present

## 2023-07-07 DIAGNOSIS — J9621 Acute and chronic respiratory failure with hypoxia: Secondary | ICD-10-CM | POA: Diagnosis not present

## 2023-07-07 DIAGNOSIS — R918 Other nonspecific abnormal finding of lung field: Secondary | ICD-10-CM | POA: Diagnosis not present

## 2023-07-07 DIAGNOSIS — R079 Chest pain, unspecified: Secondary | ICD-10-CM | POA: Diagnosis not present

## 2023-07-07 DIAGNOSIS — J810 Acute pulmonary edema: Secondary | ICD-10-CM | POA: Diagnosis not present

## 2023-07-07 DIAGNOSIS — I2510 Atherosclerotic heart disease of native coronary artery without angina pectoris: Secondary | ICD-10-CM | POA: Diagnosis not present

## 2023-07-07 DIAGNOSIS — I21A1 Myocardial infarction type 2: Secondary | ICD-10-CM | POA: Diagnosis not present

## 2023-07-07 DIAGNOSIS — E1165 Type 2 diabetes mellitus with hyperglycemia: Secondary | ICD-10-CM | POA: Diagnosis not present

## 2023-07-07 DIAGNOSIS — I4891 Unspecified atrial fibrillation: Secondary | ICD-10-CM | POA: Diagnosis not present

## 2023-07-07 DIAGNOSIS — J9601 Acute respiratory failure with hypoxia: Secondary | ICD-10-CM | POA: Diagnosis not present

## 2023-07-07 DIAGNOSIS — I447 Left bundle-branch block, unspecified: Secondary | ICD-10-CM | POA: Diagnosis not present

## 2023-07-07 DIAGNOSIS — Z66 Do not resuscitate: Secondary | ICD-10-CM | POA: Diagnosis not present

## 2023-07-07 DIAGNOSIS — A419 Sepsis, unspecified organism: Secondary | ICD-10-CM | POA: Diagnosis not present

## 2023-07-07 DIAGNOSIS — I5023 Acute on chronic systolic (congestive) heart failure: Secondary | ICD-10-CM | POA: Diagnosis not present

## 2023-07-07 DIAGNOSIS — I110 Hypertensive heart disease with heart failure: Secondary | ICD-10-CM | POA: Diagnosis not present

## 2023-07-07 NOTE — Telephone Encounter (Signed)
 Copied from CRM [id number]. Topic: Clinical - Red Word Triage >> Jul 07, 2023  9:00 AM Carrielelia G wrote: [facility] that prompted transfer to Nurse Triage: sinus congestion, difficulty breathing, patient is on oxygen  Chief Complaint: Difficulty breathing Symptoms: Nasal congestion Frequency: Yesterday Pertinent Negatives: Patient denies relief Disposition: [x] ED /[] Urgent Care (no appt availability in office) / [] Appointment(In office/virtual)/ []  [place] [facility]/ [] Home Care/ [] Refused Recommended Disposition /[] [place] [facility]/ []  Follow-up with PCP Additional Notes: Spoke to patient and patient's son, [name]. Patient has been experiencing nasal congestion and difficulty breathing since yesterday. Patient O2 currently 88% and HR 118. Patient is currently on 3L of O2, when is baseline is 2L. Advised ED, per protocol. Patient/son complied. Son is taking patient to ED.   Reason for Disposition  Oxygen level (e.g., pulse oximetry) 90 percent or lower  Answer Assessment - Initial Assessment Questions 1. RESPIRATORY STATUS: "Describe your breathing?" (e.g., wheezing, shortness of breath, unable to speak, severe coughing)      Nasal congestion, SOB in morning and upon exertion 2. ONSET: "When did this breathing problem begin?"      Yesterday 3. PATTERN "Does the difficult breathing come and go, or has it been constant since it started?"      Constant 4. SEVERITY: "How bad is your breathing?" (e.g., mild, moderate, severe)    - MILD: No SOB at rest, mild SOB with walking, speaks normally in sentences, can lie down, no retractions, pulse < 100.    - MODERATE: SOB at rest, SOB with minimal exertion and prefers to sit, cannot lie down flat, speaks in phrases, mild retractions, audible wheezing, pulse 100-120.    - SEVERE: Very SOB at rest, speaks in single words, struggling to breathe, sitting hunched forward, retractions, pulse > 120      SOB in the morning when he wakes up and when  he's walking around 5. RECURRENT SYMPTOM: "Have you had difficulty breathing before?" If Yes, ask: "When was the last time?" and "What happened that time?"      States he has been in an out of the hospital twice for pneumonia in the past 3 months  6. CARDIAC HISTORY: "Do you have any history of heart disease?" (e.g., heart attack, angina, bypass surgery, angioplasty)      Yes, 5 bypass surgeries 7. LUNG HISTORY: "Do you have any history of lung disease?"  (e.g., pulmonary embolus, asthma, emphysema)     COPD, per chart 8. CAUSE: "What do you think is causing the breathing problem?"      Unknown 9. OTHER SYMPTOMS: "Do you have any other symptoms? (e.g., dizziness, runny nose, cough, chest pain, fever)     Sinus congestion, coughing after nasal drainage 10. O2 SATURATION MONITOR:  "Do you use an oxygen saturation monitor (pulse oximeter) at home?" If Yes, ask: "What is your reading (oxygen level) today?" "What is your usual oxygen saturation reading?" (e.g., 95%)       O2 at 88% at this time, son checked this morning and at O2 80% HR 118 at this time  Protocols used: Breathing Difficulty-A-AH

## 2023-07-10 ENCOUNTER — Other Ambulatory Visit: Payer: Self-pay

## 2023-07-10 ENCOUNTER — Telehealth: Payer: Self-pay

## 2023-07-10 DIAGNOSIS — J9611 Chronic respiratory failure with hypoxia: Secondary | ICD-10-CM

## 2023-07-10 MED ORDER — AMBULATORY NON FORMULARY MEDICATION: 0 refills | Status: DC

## 2023-07-10 NOTE — Patient Outreach (Signed)
 07/10/2023  Patient, Ryan Bernard had a prescheduled CCM appt at 9am today with CCM RNCM but patient was just discharged from [facility] on 07/09/23 for acute-on-chronic CHF and will be re-engaged by [facility] RNCM Tonia Frankel for the next 30 days post hospitalization - CCM program placed on pause on 07/10/23.  Davon Abdelaziz A. Saverio Curling RN, BA, [facility], CRRN [place]  [facility] Population Health RN Care Manager Direct Dial: [phone]  Fax: [phone]

## 2023-07-10 NOTE — Telephone Encounter (Signed)
 Spoke with patient Ryan Bernard who will inform [name] that the prescription has been printed and he can pick up at front desk at his convenience.

## 2023-07-10 NOTE — Transitions of Care (Post Inpatient/ED Visit) (Signed)
 07/10/2023  Name: Ryan Bernard MRN: [id number] DOB: [birth year]  Today's TOC FU Call Status: Today's TOC FU Call Status:: Successful TOC FU Call Completed TOC FU Call Complete Date: 07/10/23 Patient's Name and Date of Birth confirmed.  Transition Care Management Follow-up Telephone Call Date of Discharge: 07/09/23 Discharge Facility: Other [facility]) Name of Other (Non[facility]) Discharge Facility: [facility] Type of Discharge: Inpatient Admission Primary Inpatient Discharge Diagnosis:: CHF exacerbation How have you been since you were released from the hospital?: Better Any questions or concerns?: No  Items Reviewed: Did you receive and understand the discharge instructions provided?: Yes Medications obtained,verified, and reconciled?: Yes (Medications Reviewed) Any new allergies since your discharge?: No Dietary orders reviewed?: Yes Type of Diet Ordered:: low sodium heart healthy Do you have support at home?: Yes People in Home [RPT]: child(ren), dependent Name of Support/Comfort Primary Source: Son, [name], daughter [name] help as needed  Medications Reviewed Today: Medications Reviewed Today     Reviewed by [name], RN (Registered Nurse) on 07/10/23 at 1600  Med List Status: <None>   Medication Order Taking? Sig Documenting Provider Last Dose Status Informant  albuterol  (PROVENTIL  HFA;VENTOLIN  HFA) 108 (90 Base) MCG/ACT inhaler [id number] Yes Inhale 2 puffs into the lungs every 6 (six) hours as needed for wheezing. [name], DO Taking Active   AMBULATORY NON FORMULARY MEDICATION [id number] No Portable Oxygen  concentrator  Titrate for POC 2 L [place] start  Sat walking on room air 82% sat walking with 2L [place] 97% Dispense: 1 each, Refills: 0 ordered  Patient not taking: Reported on 07/10/2023   [name], DO Not Taking Active   apixaban  (ELIQUIS ) 5 MG TABS tablet [id number] Yes Take 5 mg by mouth 2 (two) times daily. [provider]  Taking Active   cyanocobalamin 100 MCG tablet [id number] Yes Take 100 mcg by mouth daily. [provider] Taking Active   dapagliflozin propanediol (FARXIGA) 10 MG TABS tablet [id number] Yes Take 1 tablet by mouth daily. [provider] Taking Active   furosemide  (LASIX ) 20 MG tablet [id number] Yes Take 1 tablet (20 mg total) by mouth daily.  Patient taking differently: Take 20 mg by mouth daily. Per 07/09/23 hospital discharge Taking 2 tablets for up to 30 days   [name], [name], DO Taking Active            Med Note [facility], Chryl Holten A   Fri Jun 20, 2023 11:47 AM) Patient taking daily since 05/29/23 discharge   ipratropium-albuterol  (DUONEB) 0.5-2.5 (3) MG/3ML SOLN [id number] Yes Take 3 mLs by nebulization every 6 (six) hours as needed. [provider] Taking Active   metoprolol  succinate (TOPROL -XL) 100 MG 24 hr tablet [id number] Yes Take 100 mg by mouth daily. Take with or immediately following a meal. [provider] Taking Active   pravastatin  (PRAVACHOL ) 80 MG tablet [id number] Yes Take 1 tablet (80 mg total) by mouth at bedtime. [name], DO Taking Active   spironolactone (ALDACTONE) 25 MG tablet [id number] Yes Take 1 tablet by mouth daily. [provider] Taking Active   terazosin (HYTRIN) 10 MG capsule [id number] Yes Take 10 mg by mouth daily.   [provider] Taking Active             Home Care and Equipment/Supplies: Were Home Health Services Ordered?: No Any new equipment or medical supplies ordered?: No  Functional Questionnaire: Do you have difficulty managing or taking your medications?: Yes  Follow up appointments reviewed: PCP Follow-up appointment confirmed?: Yes (Care guide  scheduled) Specialist Hospital Follow-up appointment confirmed?: Yes Date of Specialist follow-up appointment?: 07/16/23 Follow-Up Specialty Provider:: cardiology - sees Megan at Dr Rinehart's office Do you need transportation to your follow-up  appointment?: No Do you understand care options if your condition(s) worsen?: Yes-patient verbalized understanding  SDOH Interventions Today    Flowsheet Row Most Recent Value  SDOH Interventions   Food Insecurity Interventions Intervention Not Indicated  Housing Interventions Intervention Not Indicated  Transportation Interventions Intervention Not Indicated  Utilities Interventions Intervention Not Indicated       Goals Addressed             This Visit's Progress    VBCI Transitions of Care (TOC) Care Plan       Problems:  Recent Hospitalization for treatment of CHF exacerbation   Patient with hospitalization in March for CHF - faithfully checks weight daily but did not call MD with weight gain and ultimately went back to hospital   Goal:  Over the next 30 days, the patient will not experience hospital readmission  Interventions:  Transitions of Care: Doctor Visits  - discussed the importance of doctor visits Arranged PCP follow-up within 12-14 days (Care Guide Scheduled)  Heart Failure Interventions: Basic overview and discussion of pathophysiology of Heart Failure reviewed Provided education on low sodium diet Advised patient to weigh each morning after emptying bladder Discussed importance of daily weight and advised patient to weigh and record daily Reviewed role of diuretics in prevention of fluid overload and management of heart failure; Patient reported BP at time of call 113/75 after telling TOC RN his prior BP was 91/53 - TOC RN reviewed with patient the importance of not just checking weight (reported 131lbs), bgm (reported 130) and BP - put to call MD with any issues including weight gain (before it gets so bad that he has to go to the hospital), shortness of breath, fluid build up, low/high BP or blood sugar - TOC RN reinforced that MD can make changes with medications if MD is made aware of issues and since Lasix  was increased, patient needs to be very closing  monitoring and calling MD - patient agreed to check his BP before he takes his BP meds and call RN or MD if low - TOC RN asked patient if he woke tomorrow and BP was 91/53 - what would he do, and patient said he would call the MD and get instructions before taking the BP meds.  Discussed the importance of keeping all appointments with provider  Patient Self Care Activities:  Attend all scheduled provider appointments Call pharmacy for medication refills 3-7 days in advance of running out of medications Call provider office for new concerns or questions  Notify RN Care Manager of TOC call rescheduling needs Participate in Transition of Care Program/Attend TOC scheduled calls Take medications as prescribed   call office if I gain more than 2 pounds in one day or 5 pounds in one week track weight in diary use salt in moderation watch for swelling in feet, ankles and legs every day weigh myself daily bring diary to all appointments  Plan:  Telephone follow up appointment with care management team member scheduled for:  07/18/23 2pm The patient has been provided with contact information for the care management team and has been advised to call with any health related questions or concerns.         Tonia Frankel RN, CCM [place]  VBCI-Population Health RN Care Manager [phone]

## 2023-07-10 NOTE — Telephone Encounter (Signed)
 Copied from CRM [id number]. Topic: General - Other >> Jul 10, 2023  7:43 AM Shelby Dessert H wrote: Reason for CRM: Patients son [name]) called because they are trying to get him a portable oxygen  machine, patients son said they keep losing the rx and they would like a hand written rx so they can physically take it to the place, patients sons [name]) callback number is [phone].

## 2023-07-10 NOTE — Patient Instructions (Signed)
 Visit Information  Thank you for taking time to visit with me today. Please don't hesitate to contact me if I can be of assistance to you before our next scheduled telephone appointment.  Our next appointment is by telephone on 07/18/23 at 1pm  Following is a copy of your care plan:   Goals Addressed             This Visit's Progress    VBCI Transitions of Care (TOC) Care Plan       Problems:  Recent Hospitalization for treatment of CHF exacerbation   Patient with hospitalization in March for CHF - faithfully checks weight daily but did not call MD with weight gain and ultimately went back to hospital   Goal:  Over the next 30 days, the patient will not experience hospital readmission  Interventions:  Transitions of Care: Doctor Visits  - discussed the importance of doctor visits Arranged PCP follow-up within 12-14 days (Care Guide Scheduled)  Heart Failure Interventions: Basic overview and discussion of pathophysiology of Heart Failure reviewed Provided education on low sodium diet Advised patient to weigh each morning after emptying bladder Discussed importance of daily weight and advised patient to weigh and record daily Reviewed role of diuretics in prevention of fluid overload and management of heart failure; Patient reported BP at time of call 113/75 after telling TOC RN his prior BP was 91/53 - TOC RN reviewed with patient the importance of not just checking weight (reported 131lbs), bgm (reported 130) and BP - put to call MD with any issues including weight gain (before it gets so bad that he has to go to the hospital), shortness of breath, fluid build up, low/high BP or blood sugar - TOC RN reinforced that MD can make changes with medications if MD is made aware of issues and since Lasix  was increased, patient needs to be very closing monitoring and calling MD - patient agreed to check his BP before he takes his BP meds and call RN or MD if low - TOC RN asked patient if he woke  tomorrow and BP was 91/53 - what would he do, and patient said he would call the MD and get instructions before taking the BP meds.  Discussed the importance of keeping all appointments with provider  Patient Self Care Activities:  Attend all scheduled provider appointments Call pharmacy for medication refills 3-7 days in advance of running out of medications Call provider office for new concerns or questions  Notify RN Care Manager of TOC call rescheduling needs Participate in Transition of Care Program/Attend TOC scheduled calls Take medications as prescribed   call office if I gain more than 2 pounds in one day or 5 pounds in one week track weight in diary use salt in moderation watch for swelling in feet, ankles and legs every day weigh myself daily bring diary to all appointments  Plan:  Telephone follow up appointment with care management team member scheduled for:  07/18/23 2pm The patient has been provided with contact information for the care management team and has been advised to call with any health related questions or concerns.         Patient verbalizes understanding of instructions and care plan provided today and agrees to view in MyChart. Active MyChart status and patient understanding of how to access instructions and care plan via MyChart confirmed with patient.     Telephone follow up appointment with care management team member scheduled for:07/18/23 1pm The patient has been  provided with contact information for the care management team and has been advised to call with any health related questions or concerns.   Please call the care guide team at [phone] if you need to cancel or reschedule your appointment.   Please call the Suicide and Crisis Lifeline: 988 call 1-800-273-TALK (toll free, 24 hour hotline) call 911 if you are experiencing a Mental Health or Behavioral Health Crisis or need someone to talk to.  Tonia Frankel RN, CCM [place]   VBCI-Population Health RN Care Manager [phone]

## 2023-07-16 ENCOUNTER — Encounter: Payer: Self-pay | Admitting: Family Medicine

## 2023-07-16 ENCOUNTER — Ambulatory Visit (INDEPENDENT_AMBULATORY_CARE_PROVIDER_SITE_OTHER): Admitting: Family Medicine

## 2023-07-16 VITALS — BP 117/72 | HR 98 | Ht 67.5 in | Wt 136.0 lb

## 2023-07-16 DIAGNOSIS — Z659 Problem related to unspecified psychosocial circumstances: Secondary | ICD-10-CM | POA: Insufficient documentation

## 2023-07-16 DIAGNOSIS — I2510 Atherosclerotic heart disease of native coronary artery without angina pectoris: Secondary | ICD-10-CM | POA: Diagnosis not present

## 2023-07-16 DIAGNOSIS — Z951 Presence of aortocoronary bypass graft: Secondary | ICD-10-CM | POA: Diagnosis not present

## 2023-07-16 DIAGNOSIS — I5020 Unspecified systolic (congestive) heart failure: Secondary | ICD-10-CM

## 2023-07-16 DIAGNOSIS — I255 Ischemic cardiomyopathy: Secondary | ICD-10-CM | POA: Diagnosis not present

## 2023-07-16 DIAGNOSIS — J9611 Chronic respiratory failure with hypoxia: Secondary | ICD-10-CM

## 2023-07-16 DIAGNOSIS — E785 Hyperlipidemia, unspecified: Secondary | ICD-10-CM | POA: Diagnosis not present

## 2023-07-16 DIAGNOSIS — Z955 Presence of coronary angioplasty implant and graft: Secondary | ICD-10-CM | POA: Diagnosis not present

## 2023-07-16 DIAGNOSIS — I482 Chronic atrial fibrillation, unspecified: Secondary | ICD-10-CM | POA: Diagnosis not present

## 2023-07-16 DIAGNOSIS — I10 Essential (primary) hypertension: Secondary | ICD-10-CM | POA: Diagnosis not present

## 2023-07-16 DIAGNOSIS — I5022 Chronic systolic (congestive) heart failure: Secondary | ICD-10-CM | POA: Diagnosis not present

## 2023-07-16 MED ORDER — AMBULATORY NON FORMULARY MEDICATION: 0 refills | Status: DC

## 2023-07-16 NOTE — Progress Notes (Signed)
 Ryan Bernard - 89 y.o. male MRN [id number]  Date of birth: [birth year]  Subjective Chief Complaint  Patient presents with   Hospitalization Follow-up    HPI Ryan Bernard is a 89 y.o. male here today for follow up of recent hospitalization.   He was admitted for CHF exacerbation.  Diuresed with IV Lasix .  History of chronic respiratory failure and continues on home O2.  He has been trying to get portable oxygen  concentrator as well.  Continues on p.o. Lasix , Aldactone and Farxiga.  Reports that he is feeling much better at this time.  Denies swelling in his lower extremities.  He is checking weight daily and this remains stable.  Denies chest pain or dyspnea at this time.  ROS:  A comprehensive ROS was completed and negative except as noted per HPI  No Known Allergies  Past Medical History:  Diagnosis Date   Anemia    Arthritis    BPH (benign prostatic hyperplasia)    CAD (coronary artery disease) 03/11/1993   w/ angioplasty   CHF (congestive heart failure) (HCC)    Chronic kidney disease    Chronic obstructive pulmonary disease (HCC) 12/17/2021   Chronic obstructive pulmonary disease (HCC) 12/17/2021   Diabetes mellitus 03/11/2002   type 2   Heart disease    Mild valvular w/ pulm HTN   Heart murmur    Hyperlipidemia    Hypertension    SBE (subacute bacterial endocarditis) prophylaxis candidate    Well adult exam 12/17/2021    Past Surgical History:  Procedure Laterality Date   ANGIOPLASTY  03/11/1993   [facility]   BACK SURGERY  03/11/1962   CORONARY ARTERY BYPASS GRAFT  10/27/2009   5 vessel, Dr. Dorisann Garre   EYE SURGERY     HERNIA REPAIR     x 2     Social History   Socioeconomic History   Marital status: Widowed    Spouse name: Not on file   Number of children: 2   Years of education: 58   Highest education level: 12th grade  Occupational History   Occupation: retired    Comment: Insurance claims handler  Tobacco Use   Smoking status: Never   Smokeless  tobacco: Never  Vaping Use   Vaping status: Never Used  Substance and Sexual Activity   Alcohol use: No   Drug use: No   Sexual activity: Not Currently  Other Topics Concern   Not on file  Social History Narrative   Lives with his son. He has two children. Sits around house and watches TV and walks in the yard.   Social Drivers of Corporate investment banker Strain: Low Risk  (07/10/2023)   Overall Financial Resource Strain (CARDIA)    Difficulty of Paying Living Expenses: Not hard at all  Food Insecurity: No Food Insecurity (07/10/2023)   Hunger Vital Sign    Worried About Running Out of Food in the Last Year: Never true    Ran Out of Food in the Last Year: Never true  Transportation Needs: No Transportation Needs (07/10/2023)   PRAPARE - Administrator, Civil Service (Medical): No    Lack of Transportation (Non-Medical): No  Physical Activity: Sufficiently Active (05/20/2023)   Exercise Vital Sign    Days of Exercise per Week: 6 days    Minutes of Exercise per Session: 40 min  Stress: No Stress Concern Present (07/07/2023)   Received from [facility] of Occupational Health - Occupational  Stress Questionnaire    Feeling of Stress : Only a little  Social Connections: Moderately Integrated (07/10/2023)   Social Connection and Isolation Panel [NHANES]    Frequency of Communication with Friends and Family: More than three times a week    Frequency of Social Gatherings with Friends and Family: More than three times a week    Attends Religious Services: More than 4 times per year    Active Member of [employer] or Organizations: Yes    Attends Banker Meetings: More than 4 times per year    Marital Status: Widowed    Family History  Problem Relation Age of Onset   Heart disease Mother    Hypertension Mother    Alcohol abuse Father    Depression Daughter    Depression Son     Health Maintenance  Topic Date Due   OPHTHALMOLOGY EXAM   07/12/2022   COVID-19 Vaccine (8 - Mixed Product risk 2024-25 season) 06/18/2023   FOOT EXAM  06/18/2023   HEMOGLOBIN A1C  06/18/2023   INFLUENZA VACCINE  10/10/2023   Medicare Annual Wellness (AWV)  05/19/2024   DTaP/Tdap/Td (4 - Td or Tdap) 10/09/2025   Pneumonia Vaccine 65+ Years old  Completed   Zoster Vaccines- Shingrix  Completed   HPV VACCINES  Aged Out   Meningococcal B Vaccine  Aged Out     ----------------------------------------------------------------------------------------------------------------------------------------------------------------------------------------------------------------- Physical Exam BP 117/72 (BP Location: Left Arm, Patient Position: Sitting, Cuff Size: Small)   Pulse 98   Ht 5' 7.5" (1.715 m)   Wt 136 lb (61.7 kg)   SpO2 95%   BMI 20.99 kg/m   Physical Exam Constitutional:      Appearance: Normal appearance.  HENT:     Head: Normocephalic and atraumatic.  Eyes:     General: No scleral icterus. Cardiovascular:     Rate and Rhythm: Normal rate and regular rhythm.  Pulmonary:     Effort: Pulmonary effort is normal.     Breath sounds: Normal breath sounds.  Musculoskeletal:     Cervical back: Neck supple.  Neurological:     Mental Status: He is alert.  Psychiatric:        Mood and Affect: Mood normal.        Behavior: Behavior normal.     ------------------------------------------------------------------------------------------------------------------------------------------------------------------------------------------------------------------- Assessment and Plan  Chronic respiratory failure (HCC) He will continue on O2 via nasal cannula.  Continue at 2 to 3 L/min.  Orders faxed for POC evaluation.  ACC/AHA stage C heart failure with reduced ejection fraction (HCC) Recent CHF exacerbation.  Euvolemic on exam today.  He will continue Lasix  at current strength in addition to Farxiga and Aldactone.  Continue daily weights.   Updating BMP today.   Meds ordered this encounter  Medications   AMBULATORY NON FORMULARY MEDICATION    Sig: Please complete POC evaluation. Dx: J96.10, I50.20    Dispense:  1 each    Refill:  0    Return in about 2 months (around 09/15/2023) for CHF.

## 2023-07-16 NOTE — Assessment & Plan Note (Signed)
 Recent CHF exacerbation.  Euvolemic on exam today.  He will continue Lasix  at current strength in addition to Farxiga and Aldactone.  Continue daily weights.  Updating BMP today.

## 2023-07-16 NOTE — Assessment & Plan Note (Signed)
 He will continue on O2 via nasal cannula.  Continue at 2 to 3 L/min.  Orders faxed for POC evaluation.

## 2023-07-17 ENCOUNTER — Encounter: Payer: Self-pay | Admitting: Family Medicine

## 2023-07-17 LAB — BASIC METABOLIC PANEL WITH GFR
BUN/Creatinine Ratio: 18 (ref 10–24)
BUN: 19 mg/dL (ref 8–27)
CO2: 28 mmol/L (ref 20–29)
Calcium: 9.2 mg/dL (ref 8.6–10.2)
Chloride: 99 mmol/L (ref 96–106)
Creatinine, Ser: 1.03 mg/dL (ref 0.76–1.27)
Glucose: 150 mg/dL — ABNORMAL HIGH (ref 70–99)
Potassium: 4.3 mmol/L (ref 3.5–5.2)
Sodium: 142 mmol/L (ref 134–144)
eGFR: 69 mL/min/{1.73_m2} (ref >59)

## 2023-07-18 ENCOUNTER — Other Ambulatory Visit: Payer: Self-pay

## 2023-07-18 NOTE — Transitions of Care (Post Inpatient/ED Visit) (Signed)
 Transition of Care week 2  Visit Note  07/18/2023  Name: Ryan Bernard MRN: [id number]          DOB: May 15, 1931  Situation: Patient enrolled in [facility] 30-day program. Visit completed with patient by telephone.   Background: Patient in [facility] program related to hospitalization 4/28 - 07/09/23 [facility] for CHF exacerbation. Patient was previously enrolled for admission 05/26/23-05/29/23 related to CHF. Since last call, patient saw his PCP and cardio 07/16/23 and both notes were reviewed. PCP, Dr Augustus Ledger has again sent order for POC. - 07/18/23 patient reports bgm 127 this morning, BP 119/75; O2 saturation 95%, weight 132lbs. TOC RN reinforced importance of notifying provider with weight gain, swelling, shortness of breath. Patient states he feels he is doing well and agrees to continued TOC follow up  Initial Transition Care Management Follow-up Telephone Call    Past Medical History:  Diagnosis Date   Anemia    Arthritis    BPH (benign prostatic hyperplasia)    CAD (coronary artery disease) 03/11/1993   w/ angioplasty   CHF (congestive heart failure) (HCC)    Chronic kidney disease    Chronic obstructive pulmonary disease (HCC) 12/17/2021   Chronic obstructive pulmonary disease (HCC) 12/17/2021   Diabetes mellitus 03/11/2002   type 2   Heart disease    Mild valvular w/ pulm HTN   Heart murmur    Hyperlipidemia    Hypertension    SBE (subacute bacterial endocarditis) prophylaxis candidate    Well adult exam 12/17/2021    Assessment: Patient Reported Symptoms: Cognitive Cognitive Status: Alert and oriented to person, place, and time, Normal speech and language skills      Neurological Neurological Review of Symptoms: No symptoms reported    HEENT Other HEENT Symptoms/Conditions: ongoing hearing loss - denies any other issues      Cardiovascular Cardiovascular Symptoms Reported: No symptoms reported Cardiovascular Management Strategies: Medication therapy Weight: 132  lb (59.9 kg)  Respiratory Respiratory Symptoms Reported: No symptoms reported Respiratory Conditions: COPD Respiratory Comment: patient reports O2 with 2 liters oxygen  is 95%  Endocrine Patient reports the following symptoms related to hypoglycemia or hyperglycemia : No symptoms reported Is patient diabetic?: Yes Is patient checking blood sugars at home?: Yes Endocrine Comment: patient states bgm 127  Gastrointestinal Gastrointestinal Symptoms Reported: No symptoms reported      Genitourinary Genitourinary Symptoms Reported: Frequency Additional Genitourinary Details: patient states frequency related to water pill    Integumentary Integumentary Symptoms Reported: No symptoms reported    Musculoskeletal          Psychosocial           There were no vitals filed for this visit.  Medications Reviewed Today     Reviewed by [name], RN (Registered Nurse) on 07/18/23 at 1408  Med List Status: <None>   Medication Order Taking? Sig Documenting Provider Last Dose Status Informant  albuterol  (PROVENTIL  HFA;VENTOLIN  HFA) 108 (90 Base) MCG/ACT inhaler [id number] Yes Inhale 2 puffs into the lungs every 6 (six) hours as needed for wheezing. [name], DO Taking Active   AMBULATORY NON FORMULARY MEDICATION [id number] No Portable Oxygen  concentrator  Titrate for POC 2 L [place] start  Sat walking on room air 82% sat walking with 2L [place] 97% Dispense: 1 each, Refills: 0 ordered  Patient not taking: Reported on 07/18/2023   [name], DO Not Taking Active            Med Note [facility], Undrea Shipes A   Fri  Jul 18, 2023  2:04 PM) Still does not have POC - MD submitted new order 07/16/23  AMBULATORY NON FORMULARY MEDICATION [id number] No Please complete POC evaluation. Dx: J96.10, I50.20  Patient not taking: Reported on 07/18/2023   [name], DO Not Taking Active   apixaban  (ELIQUIS ) 5 MG TABS tablet [id number] Yes Take 5 mg by mouth 2 (two) times daily. [provider] Taking  Active   cyanocobalamin 100 MCG tablet [id number] Yes Take 100 mcg by mouth daily. [provider] Taking Active   dapagliflozin propanediol (FARXIGA) 10 MG TABS tablet [id number] Yes Take 1 tablet by mouth daily. [provider] Taking Active   furosemide  (LASIX ) 20 MG tablet [id number] Yes Take 1 tablet (20 mg total) by mouth daily.  Patient taking differently: Take 20 mg by mouth daily. Per 07/09/23 hospital discharge Taking 2 tablets for up to 30 days   [name], [name], DO Taking Active            Med Note [facility], Dory Demont A   Fri Jun 20, 2023 11:47 AM) Patient taking daily since 05/29/23 discharge   ipratropium-albuterol  (DUONEB) 0.5-2.5 (3) MG/3ML SOLN [id number] Yes Take 3 mLs by nebulization every 6 (six) hours as needed. [provider] Taking Active   metoprolol  succinate (TOPROL -XL) 100 MG 24 hr tablet [id number] Yes Take 100 mg by mouth daily. Take with or immediately following a meal. [provider] Taking Active   pravastatin  (PRAVACHOL ) 80 MG tablet [id number] Yes Take 1 tablet (80 mg total) by mouth at bedtime. [name], DO Taking Active   spironolactone (ALDACTONE) 25 MG tablet [id number] Yes Take 1 tablet by mouth daily. [provider] Taking Active   terazosin (HYTRIN) 10 MG capsule [id number] Yes Take 10 mg by mouth daily.   [provider] Taking Active             Recommendation:   Follow up regarding POC  Follow Up Plan:   Telephone follow up appointment date/time:  07/24/23 4pm as requested   Tonia Frankel RN, CCM [place]  VBCI-Population Health RN Care Manager [phone]

## 2023-07-24 ENCOUNTER — Telehealth: Payer: Self-pay

## 2023-07-24 NOTE — Transitions of Care (Post Inpatient/ED Visit) (Signed)
 Transition of Care week 3  Visit Note  07/24/2023  Name: Ryan Bernard MRN: [id number]          DOB: 12/31/1931  Situation: Patient enrolled in [facility] 30-day program. Visit completed with patient by telephone.   Background: Patient in [facility] program related to hospitalization 4/28 - 07/09/23 [facility] for CHF exacerbation. Patient was previously enrolled for admission 05/26/23-05/29/23 related to CHF. Patient states he still does not have the walking test scheduled for the POC and we made a conference call to [employer] @ [phone] and spoke with Ryan Bernard who states [name] called patient 07/18/23 to schedule but left a voicemail requesting a call back. TOC RN provided Ryan Bernard with patient's daughter. Ryan Bernard phone number as requested by patient and Ryan Bernard states she will have [name] call Ryan Bernard to schedule the walking test. Patient did state the he walked to his mailbox today without the oxygen  and noted the drop of oxygen  level to 88% until he replaced the oxygen  and went back up to 95%. Patient states he sees now how important the oxygen  is and won't attempt to go to the mailbox without oxygen  again.  Initial Transition Care Management Follow-up Telephone Call    Past Medical History:  Diagnosis Date   Anemia    Arthritis    BPH (benign prostatic hyperplasia)    CAD (coronary artery disease) 03/11/1993   w/ angioplasty   CHF (congestive heart failure) (HCC)    Chronic kidney disease    Chronic obstructive pulmonary disease (HCC) 12/17/2021   Chronic obstructive pulmonary disease (HCC) 12/17/2021   Diabetes mellitus 03/11/2002   type 2   Heart disease    Mild valvular w/ pulm HTN   Heart murmur    Hyperlipidemia    Hypertension    SBE (subacute bacterial endocarditis) prophylaxis candidate    Well adult exam 12/17/2021    Assessment: Patient Reported Symptoms: Cognitive Cognitive Status: Alert and oriented to person, place, and time, Normal speech and language  skills Cognitive/Intellectual Conditions Management [RPT]: None reported or documented in medical history or problem list      Neurological Neurological Review of Symptoms: No symptoms reported    HEENT HEENT Symptoms Reported: Change or loss of hearing Other HEENT Symptoms/Conditions: ongoing difficulty hearing HEENT Comment: history cataract- reports next eye exam in June    Cardiovascular Cardiovascular Symptoms Reported: No symptoms reported Weight: 131 lb (59.4 kg)  Respiratory Respiratory Symptoms Reported: No symptoms reported Respiratory Conditions: COPD Respiratory Comment: patient reports he walked to his mailbox (about 10 steps) without oxygen  and when he returned in the house was 88% and states he sees he needs the oxygen  and reports now with oxygen  he's 95%  Endocrine Patient reports the following symptoms related to hypoglycemia or hyperglycemia : No symptoms reported Is patient diabetic?: Yes Is patient checking blood sugars at home?: Yes    Gastrointestinal Gastrointestinal Symptoms Reported: No symptoms reported      Genitourinary Genitourinary Symptoms Reported: Frequency Additional Genitourinary Details: patient states increased urination with lasix     Integumentary Integumentary Symptoms Reported: No symptoms reported    Musculoskeletal Musculoskelatal Symptoms Reviewed: No symptoms reported        Psychosocial           Vitals:   07/24/23 1619  BP: 112/73  Pulse: 74  SpO2: 95%    Medications Reviewed Today     Reviewed by [name], RN (Registered Nurse) on 07/24/23 at 1617  Med List Status: <None>  Medication Order Taking? Sig Documenting Provider Last Dose Status Informant  albuterol  (PROVENTIL  HFA;VENTOLIN  HFA) 108 (90 Base) MCG/ACT inhaler [id number] Yes Inhale 2 puffs into the lungs every 6 (six) hours as needed for wheezing. [name], DO Taking Active   AMBULATORY NON FORMULARY MEDICATION [id number] No Portable Oxygen   concentrator  Titrate for POC 2 L [place] start  Sat walking on room air 82% sat walking with 2L [place] 97% Dispense: 1 each, Refills: 0 ordered  Patient not taking: Reported on 07/24/2023   [name], DO Not Taking Active            Med Note [name], [name]   Thu Jul 24, 2023  4:16 PM) Called [employer] 5/15 - they will call daughter to schedule  AMBULATORY NON FORMULARY MEDICATION [id number] No Please complete POC evaluation. Dx: J96.10, I50.20  Patient not taking: Reported on 07/24/2023   [name], DO Not Taking Active   apixaban  (ELIQUIS ) 5 MG TABS tablet [id number] Yes Take 5 mg by mouth 2 (two) times daily. [provider] Taking Active   cyanocobalamin 100 MCG tablet [id number] Yes Take 100 mcg by mouth daily. [provider] Taking Active   dapagliflozin propanediol (FARXIGA) 10 MG TABS tablet [id number] Yes Take 1 tablet by mouth daily. [provider] Taking Active   furosemide  (LASIX ) 20 MG tablet [id number] Yes Take 1 tablet (20 mg total) by mouth daily.  Patient taking differently: Take 20 mg by mouth daily. Per 07/09/23 hospital discharge Taking 2 tablets for up to 30 days   [name], [name], DO Taking Active            Med Note [facility], [name]   Fri Jun 20, 2023 11:47 AM) Patient taking daily since 05/29/23 discharge   ipratropium-albuterol  (DUONEB) 0.5-2.5 (3) MG/3ML SOLN [id number] Yes Take 3 mLs by nebulization every 6 (six) hours as needed. [provider] Taking Active   metoprolol  succinate (TOPROL -XL) 100 MG 24 hr tablet [id number] Yes Take 100 mg by mouth daily. Take with or immediately following a meal. [provider] Taking Active   pravastatin  (PRAVACHOL ) 80 MG tablet [id number] Yes Take 1 tablet (80 mg total) by mouth at bedtime. [name], DO Taking Active   spironolactone (ALDACTONE) 25 MG tablet [id number] Yes Take 1 tablet by mouth daily. [provider] Taking Active   terazosin (HYTRIN) 10 MG capsule [id number]  Yes Take 10 mg by mouth daily.   [provider] Taking Active             Recommendation:   PCP Follow-up as directed  Follow Up Plan:   Telephone follow up appointment date/time:  07/31/23 2pm  Tonia Frankel RN, CCM [address]  VBCI-Population Health RN Care Manager [phone]

## 2023-07-30 DIAGNOSIS — I482 Chronic atrial fibrillation, unspecified: Secondary | ICD-10-CM | POA: Diagnosis not present

## 2023-07-30 DIAGNOSIS — Z7409 Other reduced mobility: Secondary | ICD-10-CM | POA: Diagnosis not present

## 2023-07-31 ENCOUNTER — Other Ambulatory Visit: Payer: Self-pay

## 2023-07-31 NOTE — Transitions of Care (Post Inpatient/ED Visit) (Signed)
 Transition of Care week 4  Visit Note  07/31/2023  Name: Ryan Bernard MRN: [id number]          DOB: 1931/05/17  Situation: Patient enrolled in [facility] 30-day program. Visit completed with patient by telephone.   Background: Patient in [facility] program related to hospitalization 4/28 - 07/09/23 [facility] for CHF exacerbation. Patient was previously enrolled for admission 05/26/23-05/29/23 related to CHF. Patient states he failed the POC walking test but was able to get smaller tanks but he needs continuous oxygen  and the smaller tanks allow him to get out but only last 30 minutes. Patient feels like he is doing well today although he reports having a fall Monday 07/28/23 after being outside about 1.5 hours and thinks his oxygen  tank may have emptied because his oxygen  was low when he got inside - [facility] RN provided education and patient states he now understands to check before going out    Initial Transition Care Management Follow-up Telephone Call    Past Medical History:  Diagnosis Date   Anemia    Arthritis    BPH (benign prostatic hyperplasia)    CAD (coronary artery disease) 03/11/1993   w/ angioplasty   CHF (congestive heart failure) (HCC)    Chronic kidney disease    Chronic obstructive pulmonary disease (HCC) 12/17/2021   Chronic obstructive pulmonary disease (HCC) 12/17/2021   Diabetes mellitus 03/11/2002   type 2   Heart disease    Mild valvular w/ pulm HTN   Heart murmur    Hyperlipidemia    Hypertension    SBE (subacute bacterial endocarditis) prophylaxis candidate    Well adult exam 12/17/2021    Assessment: Patient Reported Symptoms: Cognitive Cognitive Status: Alert and oriented to person, place, and time, Normal speech and language skills Cognitive/Intellectual Conditions Management [RPT]: None reported or documented in medical history or problem list      Neurological Neurological Review of Symptoms: No symptoms reported    HEENT HEENT Symptoms  Reported: Change or loss of hearing Other HEENT Symptoms/Conditions: ongoing hearing loss - appears to be having more difficulty hearing today as TOC RN had to repeat but patient said he doesn't think it's any worse - states one day he thinks he'll get a hearing aid      Cardiovascular Cardiovascular Symptoms Reported: No symptoms reported Cardiovascular Conditions: Heart failure, High blood cholesterol, Hypertension, Coronary artery disease (CABG 2012, PCI with DES 2021, Afib) Cardiovascular Management Strategies: Medication therapy, Diet modification Weight: 132 lb (59.9 kg) Cardiovascular Comment: Patient states he feels he is doing well today - patient denies any swelling in legs  Respiratory Respiratory Symptoms Reported: No symptoms reported Respiratory Conditions: COPD Respiratory Comment: Patient states he failed the POC walking test but was able to get smaller tanks but he needs continuous oxygen  and the smaller tanks allow him to get out but he thinks they only last 30 minutes  Endocrine Patient reports the following symptoms related to hypoglycemia or hyperglycemia : No symptoms reported Is patient diabetic?: Yes Is patient checking blood sugars at home?: Yes Endocrine Conditions: Diabetes  Gastrointestinal Gastrointestinal Symptoms Reported: No symptoms reported      Genitourinary Genitourinary Symptoms Reported: Frequency Additional Genitourinary Details: Frequency with Lasix     Integumentary Integumentary Symptoms Reported: No symptoms reported    Musculoskeletal   Musculoskeletal Conditions: Other Other Musculoskeletal Conditions: patient states he fell at home Monday 07/28/23 when he was walking/sitting around outside - states he didn't get hurt and states he went inside he  checked oxygen  level when he got inside and oxygen  level was 85% - he thinks he may have run out of oxygen  - educated on checking tank oxygen  level before going outside and patient states he now knows but  didn't then Falls in the past year?: Yes Number of falls in past year: 2 or more Was there an injury with Fall?: No Fall Risk Category Calculator: 2 Patient Fall Risk Level: Moderate Fall Risk Patient at Risk for Falls Due to: History of fall(s)  Psychosocial           Vitals:   07/31/23 1410  BP: 129/83  Pulse: 80  SpO2: 95%    Medications Reviewed Today     Reviewed by [name], RN (Registered Nurse) on 07/31/23 at 1405  Med List Status: <None>   Medication Order Taking? Sig Documenting Provider Last Dose Status Informant  albuterol  (PROVENTIL  HFA;VENTOLIN  HFA) 108 (90 Base) MCG/ACT inhaler [id number] Yes Inhale 2 puffs into the lungs every 6 (six) hours as needed for wheezing. [name], DO Taking Active   AMBULATORY NON FORMULARY MEDICATION [id number] No Portable Oxygen  concentrator  Titrate for POC 2 L [place] start  Sat walking on room air 82% sat walking with 2L [place] 97% Dispense: 1 each, Refills: 0 ordered  Patient not taking: Reported on 07/18/2023   [name], DO Not Taking Active            Med Note [facility], Daren Yeagle A   Thu Jul 31, 2023  2:03 PM) Patient states he failed the POC test and will not be getting the POC  AMBULATORY NON FORMULARY MEDICATION [id number] No Please complete POC evaluation. Dx: J96.10, I50.20  Patient not taking: Reported on 07/18/2023   [name], DO Not Taking Consider Medication Status and Discontinue (No longer needed (for PRN medications))   apixaban  (ELIQUIS ) 5 MG TABS tablet [id number] Yes Take 5 mg by mouth 2 (two) times daily. [provider] Taking Active   cyanocobalamin 100 MCG tablet [id number] Yes Take 100 mcg by mouth daily. [provider] Taking Active   dapagliflozin propanediol (FARXIGA) 10 MG TABS tablet [id number] Yes Take 1 tablet by mouth daily. [provider] Taking Active   furosemide  (LASIX ) 20 MG tablet [id number] Yes Take 1 tablet (20 mg total) by mouth daily.  Patient taking  differently: Take 20 mg by mouth daily. Per 07/09/23 hospital discharge Taking 2 tablets for up to 30 days   [name], [name], DO Taking Active            Med Note [facility], Melania Kirks A   Fri Jun 20, 2023 11:47 AM) Patient taking daily since 05/29/23 discharge   ipratropium-albuterol  (DUONEB) 0.5-2.5 (3) MG/3ML SOLN [id number] Yes Take 3 mLs by nebulization every 6 (six) hours as needed. [provider] Taking Active   metoprolol  succinate (TOPROL -XL) 100 MG 24 hr tablet [id number] Yes Take 100 mg by mouth daily. Take with or immediately following a meal. [provider] Taking Active   pravastatin  (PRAVACHOL ) 80 MG tablet [id number] Yes Take 1 tablet (80 mg total) by mouth at bedtime. [name], DO Taking Active   spironolactone (ALDACTONE) 25 MG tablet [id number] Yes Take 1 tablet by mouth daily. [provider] Taking Active   terazosin (HYTRIN) 10 MG capsule [id number] Yes Take 10 mg by mouth daily.   [provider] Taking Active             Recommendation:   Call PCP with any signs /symptoms or  change in condition  Follow Up Plan:   Telephone follow up appointment date/time:  08/07/23 2pm  Tonia Frankel RN, CCM [place]  VBCI-Population Health RN Care Manager [phone]

## 2023-08-07 ENCOUNTER — Telehealth: Payer: Self-pay

## 2023-08-07 NOTE — Transitions of Care (Post Inpatient/ED Visit) (Signed)
 Transition of Care Week #5  Visit Note  08/07/2023  Name: Ryan Bernard MRN: [id number]          DOB: [birth year]  Situation: Patient enrolled in [facility] 30-day program. Visit completed with patient by telephone.   Background: Patient in [facility] program related to hospitalization 4/28 - 07/09/23 [facility] for CHF exacerbation. Patient was previously enrolled for admission 05/26/23-05/29/23 related to CHF. Patient states he is feeling well but states he did increase his oxygen  to 3 liters and reports 93% now and was 89-91% on 2 liters. Patient reports weight of 135lbs but denies swelling. During medication review, TOC RN asked patient what he is to do with Lasix  now since the 06/29/23 discharge instructions advised to take 40mg  daily as needed for 30 days and patient states he has been taking this daily. Patient unsure. TOC RN sent chat message to PCP, Dr [name] who was made aware of O2 at 3liters 93%, BP 123/82 and that patient has been taking the 40mg  daily. Dr Augustus Ledger advised that patient should go back to 20mg  daily but increase back to 40mg  if weight gain of 5lbs overnight, edema, shortness of breath. Patient read back instructions. Patient agreeable to an additional TOC call 08/12/23 to reassess and will discuss moving back to CCM program for ongoing management  Initial Transition Care Management Follow-up Telephone Call    Past Medical History:  Diagnosis Date   Anemia    Arthritis    BPH (benign prostatic hyperplasia)    CAD (coronary artery disease) 03/11/1993   w/ angioplasty   CHF (congestive heart failure) (HCC)    Chronic kidney disease    Chronic obstructive pulmonary disease (HCC) 12/17/2021   Chronic obstructive pulmonary disease (HCC) 12/17/2021   Diabetes mellitus 03/11/2002   type 2   Heart disease    Mild valvular w/ pulm HTN   Heart murmur    Hyperlipidemia    Hypertension    SBE (subacute bacterial endocarditis) prophylaxis candidate    Well adult exam  12/17/2021    Assessment: Patient Reported Symptoms: Cognitive Cognitive Status: Alert and oriented to person, place, and time, Normal speech and language skills Cognitive/Intellectual Conditions Management [RPT]: None reported or documented in medical history or problem list      Neurological Neurological Review of Symptoms: No symptoms reported    HEENT HEENT Symptoms Reported: Change or loss of hearing Other HEENT Symptoms/Conditions: patient has ongoing difficulty hearing      Cardiovascular Cardiovascular Symptoms Reported: No symptoms reported Cardiovascular Management Strategies: Medication therapy, Diet modification Weight: 135 lb (61.2 kg) Cardiovascular Comment: Patient denies swelling - TOC RN reviewed with patient that Lasix  40mg  dose was as needed for 30 days beginning 07/09/23 and he has gained 3lbs since our last conversation 07/31/23- Ask patient if he knows what to do with lasix  after 30 days and he did not TOC RN sent chat to PCP, Dr [name] who advised patient should go back to 20mg  daily and increase again to 40mg  with 5lb weight gain over night, edema, shortness of breath - patient verbalized understanding and repeated back  Respiratory      Endocrine Patient reports the following symptoms related to hypoglycemia or hyperglycemia : No symptoms reported Is patient diabetic?: Yes Is patient checking blood sugars at home?: Yes    Gastrointestinal Gastrointestinal Symptoms Reported: No symptoms reported      Genitourinary Genitourinary Symptoms Reported: Frequency Additional Genitourinary Details: Patient reports frequency with Lasix     Integumentary Integumentary  Symptoms Reported: No symptoms reported    Musculoskeletal          Psychosocial           Vitals:   08/07/23 1429  BP: 123/82  Pulse: 94  SpO2: 93%    Medications Reviewed Today     Reviewed by [name], RN (Registered Nurse) on 08/07/23 at 1420  Med List Status: <None>    Medication Order Taking? Sig Documenting Provider Last Dose Status Informant  albuterol  (PROVENTIL  HFA;VENTOLIN  HFA) 108 (90 Base) MCG/ACT inhaler [id number] Yes Inhale 2 puffs into the lungs every 6 (six) hours as needed for wheezing. [name], DO Taking Active   AMBULATORY NON FORMULARY MEDICATION [id number] No Portable Oxygen  concentrator  Titrate for POC 2 L [place] start  Sat walking on room air 82% sat walking with 2L  97% Dispense: 1 each, Refills: 0 ordered  Patient not taking: Reported on 08/07/2023   [name], DO Not Taking Consider Medication Status and Discontinue (No longer needed (for PRN medications))            Med Note Bernadene Brewer, Liyla Radliff A   Thu Jul 31, 2023  2:03 PM) Patient states he failed the POC test and will not be getting the POC  AMBULATORY NON FORMULARY MEDICATION [id number] No Please complete POC evaluation. Dx: J96.10, I50.20  Patient not taking: Reported on 08/07/2023   [name], DO Not Taking Consider Medication Status and Discontinue (No longer needed (for PRN medications))   apixaban  (ELIQUIS ) 5 MG TABS tablet [id number] Yes Take 5 mg by mouth 2 (two) times daily. [provider] Taking Active   cyanocobalamin 100 MCG tablet [id number] Yes Take 100 mcg by mouth daily. [provider] Taking Active   dapagliflozin propanediol (FARXIGA) 10 MG TABS tablet [id number] Yes Take 1 tablet by mouth daily. [provider] Taking Active   furosemide  (LASIX ) 20 MG tablet [id number] Yes Take 1 tablet (20 mg total) by mouth daily.  Patient taking differently: Take 20 mg by mouth daily. Per 07/09/23 hospital discharge Taking 2 tablets for up to 30 days   [name], DO Taking Active            Med Note [facility], Kristilyn Coltrane A   Thu Aug 07, 2023  2:18 PM) Per Dr [name] via chat - Patient to take 20mg  daily and increase to 40mg  daily as needed for 5lbs weight gain in 24hours, edema, shortness of breath  ipratropium-albuterol  (DUONEB)  0.5-2.5 (3) MG/3ML SOLN [id number] Yes Take 3 mLs by nebulization every 6 (six) hours as needed. [provider] Taking Active   metoprolol  succinate (TOPROL -XL) 100 MG 24 hr tablet [id number] Yes Take 100 mg by mouth daily. Take with or immediately following a meal. [provider] Taking Active   pravastatin  (PRAVACHOL ) 80 MG tablet [id number] Yes Take 1 tablet (80 mg total) by mouth at bedtime. [name], DO Taking Active   spironolactone (ALDACTONE) 25 MG tablet [id number] Yes Take 1 tablet by mouth daily. [provider] Taking Active   terazosin (HYTRIN) 10 MG capsule [id number] Yes Take 10 mg by mouth daily.   [provider] Taking Active             Recommendation:   Continue Current Plan of Care  Follow Up Plan:   Telephone follow up appointment date/time:  08/12/23 2pm  Tonia Frankel RN, CCM [place]  VBCI-Population Health RN Care Manager [phone]

## 2023-08-12 ENCOUNTER — Other Ambulatory Visit: Payer: Self-pay

## 2023-08-12 VITALS — BP 120/70 | HR 74 | Wt 134.0 lb

## 2023-08-12 NOTE — Transitions of Care (Post Inpatient/ED Visit) (Signed)
 Transition of Care Week #5 [facility] Program Closure   Visit Note  08/12/2023  Name: Ryan Bernard MRN: [id number]          DOB: Oct 25, 1931  Situation: Patient enrolled in [facility] 30-day program. Visit completed with patient by telephone.   Background: Patient in [facility] program related to hospitalization 4/28 - 07/09/23 [facility] for CHF exacerbation. Patient was previously enrolled for admission 05/26/23-05/29/23 related to CHF.Patient states he is doing well today. Patient feels ready for [facility] Program closure and agreed to CCM referral. TOC RN reinforced importance of checking weight, looking for edema, recognizing signs of CHF and responding appropriately by taking increased dose of diuretic/notifying MD as directed. Patient was able to recall instructions per PCP during our last call. CCM referral completed. It has been my extreme pleasure working with this patient.   Initial Transition Care Management Follow-up Telephone Call    Past Medical History:  Diagnosis Date   Anemia    Arthritis    BPH (benign prostatic hyperplasia)    CAD (coronary artery disease) 03/11/1993   w/ angioplasty   CHF (congestive heart failure) (HCC)    Chronic kidney disease    Chronic obstructive pulmonary disease (HCC) 12/17/2021   Chronic obstructive pulmonary disease (HCC) 12/17/2021   Diabetes mellitus 03/11/2002   type 2   Heart disease    Mild valvular w/ pulm HTN   Heart murmur    Hyperlipidemia    Hypertension    SBE (subacute bacterial endocarditis) prophylaxis candidate    Well adult exam 12/17/2021    Assessment: Patient Reported Symptoms: Cognitive Cognitive Status: Alert and oriented to person, place, and time, Normal speech and language skills Cognitive/Intellectual Conditions Management [RPT]: None reported or documented in medical history or problem list      Neurological Neurological Review of Symptoms: No symptoms reported    HEENT Other HEENT Symptoms/Conditions: patient has  difficulty hearing - must talk slow and loud HEENT Comment: History of cataract - next eye exam this month, June    Cardiovascular Cardiovascular Symptoms Reported: No symptoms reported Cardiovascular Conditions: Hypertension Weight: 134 lb (60.8 kg) Cardiovascular Comment: Patient continues to deny any swelling or shortness of breath and TOC RN reviewed instructions from PCP regarding diurectic from our 08/07/23 call - Patient able to state the instructions on managing weight gain, edema as directed  Respiratory Respiratory Symptoms Reported: No symptoms reported    Endocrine Patient reports the following symptoms related to hypoglycemia or hyperglycemia : No symptoms reported Is patient diabetic?: Yes Is patient checking blood sugars at home?: Yes Endocrine Conditions: Diabetes  Gastrointestinal Gastrointestinal Symptoms Reported: No symptoms reported      Genitourinary Genitourinary Symptoms Reported: No symptoms reported    Integumentary Integumentary Symptoms Reported: No symptoms reported    Musculoskeletal Musculoskelatal Symptoms Reviewed: No symptoms reported        Psychosocial Psychosocial Symptoms Reported: No symptoms reported         Vitals:   08/12/23 1441  BP: 120/70  Pulse: 74  SpO2: 96%    Medications Reviewed Today     Reviewed by [name], RN (Registered Nurse) on 08/12/23 at 1433  Med List Status: <None>   Medication Order Taking? Sig Documenting Provider Last Dose Status Informant  albuterol  (PROVENTIL  HFA;VENTOLIN  HFA) 108 (90 Base) MCG/ACT inhaler [id number] Yes Inhale 2 puffs into the lungs every 6 (six) hours as needed for wheezing. [name], [name], DO Taking Active   AMBULATORY NON FORMULARY MEDICATION [id number] No Portable Oxygen   concentrator  Titrate for POC 2 L [place] start  Sat walking on room air 82% sat walking with 2L [place] 97% Dispense: 1 each, Refills: 0 ordered  Patient not taking: Reported on 07/18/2023   [name], DO Not  Taking Consider Medication Status and Discontinue (No longer needed (for PRN medications))            Med Note Bernadene Brewer, Khaza Blansett A   Thu Jul 31, 2023  2:03 PM) Patient states he failed the POC test and will not be getting the POC  AMBULATORY NON FORMULARY MEDICATION [id number] No Please complete POC evaluation. Dx: J96.10, I50.20  Patient not taking: Reported on 07/18/2023   [name], DO Not Taking Consider Medication Status and Discontinue (No longer needed (for PRN medications))   apixaban  (ELIQUIS ) 5 MG TABS tablet [id number] Yes Take 5 mg by mouth 2 (two) times daily. [provider] Taking Active   cyanocobalamin 100 MCG tablet [id number] Yes Take 100 mcg by mouth daily. [provider] Taking Active   dapagliflozin propanediol (FARXIGA) 10 MG TABS tablet [id number] Yes Take 1 tablet by mouth daily. [provider] Taking Active   furosemide  (LASIX ) 20 MG tablet [id number] Yes Take 1 tablet (20 mg total) by mouth daily.  Patient taking differently: Take 20 mg by mouth daily. Per 07/09/23 hospital discharge Taking 2 tablets for up to 30 days   [name], DO Taking Active            Med Note [facility], Jeramy Dimmick A   Thu Aug 07, 2023  2:18 PM) Per Dr [name] via chat - Patient to take 20mg  daily and increase to 40mg  daily as needed for 5lbs weight gain in 24hours, edema, shortness of breath  ipratropium-albuterol  (DUONEB) 0.5-2.5 (3) MG/3ML SOLN [id number] Yes Take 3 mLs by nebulization every 6 (six) hours as needed. [provider] Taking Active   metoprolol  succinate (TOPROL -XL) 100 MG 24 hr tablet [id number] Yes Take 100 mg by mouth daily. Take with or immediately following a meal. [provider] Taking Active   pravastatin  (PRAVACHOL ) 80 MG tablet [id number] Yes Take 1 tablet (80 mg total) by mouth at bedtime. [name], DO Taking Active   spironolactone (ALDACTONE) 25 MG tablet [id number] Yes Take 1 tablet by mouth daily. [provider] Taking Active   terazosin (HYTRIN) 10 MG capsule [id number] Yes Take 10 mg by mouth daily.   [provider] Taking Active             Recommendation:   Referral to: CCM  Follow Up Plan:   Closing From:  Transitions of Care Program  Tonia Frankel RN, CCM [facility]  VBCI-Population Health RN Care Manager [phone]

## 2023-08-21 ENCOUNTER — Telehealth: Payer: Self-pay | Admitting: *Deleted

## 2023-08-21 LAB — HM DIABETES EYE EXAM

## 2023-08-21 NOTE — Progress Notes (Signed)
 Complex Care Management Note  Care Guide Note 08/21/2023 Name: Guillaume Weninger MRN: [id number] DOB: 13-May-1931  Hershel Corkery is a 89 y.o. year old male who sees [name], DO for primary care. I reached out to Carrie Civil by phone today to offer complex care management services.  Mr. Stmarie was given information about Complex Care Management services today including:   The Complex Care Management services include support from the care team which includes your Nurse Care Manager, Clinical Social Worker, or Pharmacist.  The Complex Care Management team is here to help remove barriers to the health concerns and goals most important to you. Complex Care Management services are voluntary, and the patient may decline or stop services at any time by request to their care team member.   Complex Care Management Consent Status: Patient agreed to services and verbal consent obtained.   Follow up plan:  Telephone appointment with complex care management team member scheduled for:  08/22/2023  Encounter Outcome:  Patient Scheduled  Kandis Ormond, CMA [place]  [facility], [facility] Guide Direct Dial: [phone]  Fax: [phone] Website: [email or web address]

## 2023-08-22 ENCOUNTER — Other Ambulatory Visit: Payer: Self-pay

## 2023-08-23 NOTE — Patient Outreach (Signed)
 Complex Care Management   Visit Note  08/22/2023  Name:  Ryan Bernard MRN: [id number] DOB: 1931-05-18  Situation: Referral received for Complex Care Management related to Heart Failure I obtained verbal consent from Patient.  Visit completed with patient  on the phone  Background:   Past Medical History:  Diagnosis Date   Anemia    Arthritis    BPH (benign prostatic hyperplasia)    CAD (coronary artery disease) 03/11/1993   w/ angioplasty   CHF (congestive heart failure) (HCC)    Chronic kidney disease    Chronic obstructive pulmonary disease (HCC) 12/17/2021   Chronic obstructive pulmonary disease (HCC) 12/17/2021   Diabetes mellitus 03/11/2002   type 2   Heart disease    Mild valvular w/ pulm HTN   Heart murmur    Hyperlipidemia    Hypertension    SBE (subacute bacterial endocarditis) prophylaxis candidate    Well adult exam 12/17/2021    Assessment: Patient Reported Symptoms:  Cognitive Cognitive Status: Alert and oriented to person, place, and time, Normal speech and language skills Cognitive/Intellectual Conditions Management [RPT]: None reported or documented in medical history or problem list   Health Maintenance Behaviors: Annual physical exam, Immunizations, Sleep adequate, Healthy diet Health Facilitated by: Healthy diet, Rest  Neurological Neurological Review of Symptoms: No symptoms reported    HEENT HEENT Symptoms Reported: Change or loss of hearing Other HEENT Symptoms/Conditions: patient has difficulty hearing, must speak slowly and loudly HEENT Management Strategies: Coping strategies, Routine screening    Cardiovascular Cardiovascular Symptoms Reported: No symptoms reported Does patient have uncontrolled Hypertension?: No Cardiovascular Conditions: Heart failure, Dysrhythmia, Hypertension, Valvular disease Cardiovascular Management Strategies: Medication therapy, Diet modification, Coping strategies, Adequate rest, Routine screening, Weight  management Do You Have a Working Readable Scale?: Yes Weight: 134 lb (60.8 kg) Cardiovascular Self-Management Outcome: 3 (uncertain)  Respiratory Respiratory Symptoms Reported: No symptoms reported Respiratory Conditions: COPD  Endocrine Patient reports the following symptoms related to hypoglycemia or hyperglycemia : No symptoms reported Is patient diabetic?: Yes Is patient checking blood sugars at home?: Yes Endocrine Conditions: Diabetes Endocrine Management Strategies: Coping strategies, Medication therapy, Diet modification, Routine screening  Gastrointestinal Gastrointestinal Symptoms Reported: No symptoms reported   Nutrition Risk Screen (CP): No indicators present  Genitourinary Genitourinary Symptoms Reported: Frequency Additional Genitourinary Details: Patient attributes this to having to take Lasix  for CHF management    Integumentary Integumentary Symptoms Reported: No symptoms reported    Musculoskeletal Musculoskelatal Symptoms Reviewed: Weakness   Falls in the past year?: No Number of falls in past year: 1 or less Was there an injury with Fall?: No Fall Risk Category Calculator: 0 Patient Fall Risk Level: Low Fall Risk    Psychosocial Psychosocial Symptoms Reported: No symptoms reported     Quality of Family Relationships: supportive, involved, helpful Do you feel physically threatened by others?: No      07/24/2023    4:28 PM  Depression screen PHQ 2/9  Decreased Interest 0  Down, Depressed, Hopeless 0  PHQ - 2 Score 0    There were no vitals filed for this visit.  Medications Reviewed Today     Reviewed by Randye Buttner, RN (Registered Nurse) on 08/22/23 at 1336  Med List Status: <None>   Medication Order Taking? Sig Documenting Provider Last Dose Status Informant  albuterol  (PROVENTIL  HFA;VENTOLIN  HFA) 108 (90 Base) MCG/ACT inhaler [id number] Yes Inhale 2 puffs into the lungs every 6 (six) hours as needed for wheezing. [name], [name], DO   Active  AMBULATORY NON FORMULARY MEDICATION [id number] Yes Portable Oxygen  concentrator  Titrate for POC 2 L [address] start  Sat walking on room air 82% sat walking with 2L [place] 97% Dispense: 1 each, Refills: 0 ordered [name], DO  Active            Med Note Bernadene Brewer, RHONDA A   Thu Jul 31, 2023  2:03 PM) Patient states he failed the POC test and will not be getting the POC  AMBULATORY NON FORMULARY MEDICATION [id number] Yes Please complete POC evaluation. Dx: J96.10, I50.20 [name], DO  Active   apixaban  (ELIQUIS ) 5 MG TABS tablet [id number] Yes Take 5 mg by mouth 2 (two) times daily. [provider]  Active   cyanocobalamin 100 MCG tablet [id number] Yes Take 100 mcg by mouth daily. [provider]  Active   dapagliflozin propanediol (FARXIGA) 10 MG TABS tablet [id number] Yes Take 1 tablet by mouth daily. [provider]  Active   furosemide  (LASIX ) 20 MG tablet [id number] Yes Take 1 tablet (20 mg total) by mouth daily. [name], [name], DO  Active            Med Note [facility], RHONDA A   Thu Aug 07, 2023  2:18 PM) Per Dr [name] via chat - Patient to take 20mg  daily and increase to 40mg  daily as needed for 5lbs weight gain in 24hours, edema, shortness of breath  ipratropium-albuterol  (DUONEB) 0.5-2.5 (3) MG/3ML SOLN [id number] Yes Take 3 mLs by nebulization every 6 (six) hours as needed. [provider]  Active   metoprolol  succinate (TOPROL -XL) 100 MG 24 hr tablet [id number] Yes Take 100 mg by mouth daily. Take with or immediately following a meal. [provider]  Active   pravastatin  (PRAVACHOL ) 80 MG tablet [id number] Yes Take 1 tablet (80 mg total) by mouth at bedtime. [name], DO  Active   spironolactone (ALDACTONE) 25 MG tablet [id number] Yes Take 1 tablet by mouth daily. [provider]  Active   terazosin (HYTRIN) 10 MG capsule [id number] Yes Take 10 mg by mouth daily.   [provider]  Active              Recommendation:   PCP Follow-up 09/25/23 at 3:30pm with Dr. Augustus Ledger  Follow Up Plan:   Face to Face appointment date/time: 09/26/23 at 1pm.  Bartholomew Light A. Saverio Curling RN, BA, [facility], CRRN [place]  [facility] Population Health RN Care Manager Direct Dial: [phone]  Fax: [phone]

## 2023-08-23 NOTE — Patient Instructions (Signed)
 Visit Information  Thank you for taking time to visit with me today. Please don't hesitate to contact me if I can be of assistance to you before our next scheduled telephone appointment.  Our next appointment is by telephone on 7/18 at 1pm  Following is a copy of your care plan:   Goals Addressed             This Visit's Progress    VBCI RN Care Plan       Problems:  Chronic Disease Management support and education needs related to CHF  Goal: Over the next 6 months the Patient will continue to work with RN Care Manager and/or Social Worker to address care management and care coordination needs related to CHF as evidenced by adherence to care management team scheduled appointments      Interventions:   Heart Failure Interventions: Provided education on low sodium diet Discussed importance of daily weight and advised patient to weigh and record daily Reviewed role of diuretics in prevention of fluid overload and management of heart failure; Discussed the importance of keeping all appointments with provider Screening for signs and symptoms of depression related to chronic disease state  Assessed social determinant of health barriers   Patient Self-Care Activities:  Attend all scheduled provider appointments Call pharmacy for medication refills 3-7 days in advance of running out of medications Call provider office for new concerns or questions  Perform all self care activities independently  Perform IADL's (shopping, preparing meals, housekeeping, managing finances) independently Take medications as prescribed   call office if I gain more than 2 pounds in one day or 5 pounds in one week do ankle pumps when sitting keep legs up while sitting track weight in diary use salt in moderation watch for swelling in feet, ankles and legs every day weigh myself daily develop a rescue plan follow rescue plan if symptoms flare-up eat more whole grains, fruits and vegetables, lean meats  and healthy fats track symptoms and what helps feel better or worse dress right for the weather, hot or cold  Plan:  The patient has been provided with contact information for the care management team and has been advised to call with any health related questions or concerns.              Patient verbalizes understanding of instructions and care plan provided today and agrees to view in MyChart. Active MyChart status and patient understanding of how to access instructions and care plan via MyChart confirmed with patient.     The patient has been provided with contact information for the care management team and has been advised to call with any health related questions or concerns.   Please call the care guide team at [phone] if you need to cancel or reschedule your appointment.   Please call 1-800-273-TALK (toll free, 24 hour hotline) if you are experiencing a Mental Health or Behavioral Health Crisis or need someone to talk to.  Aaryn Sermon A. Saverio Curling RN, BA, [facility], CRRN [place]  [facility] Population Health RN Care Manager Direct Dial: [phone]  Fax: [phone]

## 2023-08-26 DIAGNOSIS — I10 Essential (primary) hypertension: Secondary | ICD-10-CM | POA: Diagnosis not present

## 2023-08-26 DIAGNOSIS — I482 Chronic atrial fibrillation, unspecified: Secondary | ICD-10-CM | POA: Diagnosis not present

## 2023-08-26 DIAGNOSIS — I255 Ischemic cardiomyopathy: Secondary | ICD-10-CM | POA: Diagnosis not present

## 2023-08-30 DIAGNOSIS — I482 Chronic atrial fibrillation, unspecified: Secondary | ICD-10-CM | POA: Diagnosis not present

## 2023-08-30 DIAGNOSIS — Z7409 Other reduced mobility: Secondary | ICD-10-CM | POA: Diagnosis not present

## 2023-09-09 DIAGNOSIS — I5022 Chronic systolic (congestive) heart failure: Secondary | ICD-10-CM | POA: Diagnosis not present

## 2023-09-09 DIAGNOSIS — Z955 Presence of coronary angioplasty implant and graft: Secondary | ICD-10-CM | POA: Diagnosis not present

## 2023-09-09 DIAGNOSIS — I2510 Atherosclerotic heart disease of native coronary artery without angina pectoris: Secondary | ICD-10-CM | POA: Diagnosis not present

## 2023-09-09 DIAGNOSIS — I482 Chronic atrial fibrillation, unspecified: Secondary | ICD-10-CM | POA: Diagnosis not present

## 2023-09-09 DIAGNOSIS — I10 Essential (primary) hypertension: Secondary | ICD-10-CM | POA: Diagnosis not present

## 2023-09-09 DIAGNOSIS — E785 Hyperlipidemia, unspecified: Secondary | ICD-10-CM | POA: Diagnosis not present

## 2023-09-09 DIAGNOSIS — I255 Ischemic cardiomyopathy: Secondary | ICD-10-CM | POA: Diagnosis not present

## 2023-09-09 DIAGNOSIS — Z951 Presence of aortocoronary bypass graft: Secondary | ICD-10-CM | POA: Diagnosis not present

## 2023-09-25 ENCOUNTER — Ambulatory Visit (INDEPENDENT_AMBULATORY_CARE_PROVIDER_SITE_OTHER): Admitting: Family Medicine

## 2023-09-25 ENCOUNTER — Encounter: Payer: Self-pay | Admitting: Family Medicine

## 2023-09-25 VITALS — BP 131/69 | HR 76 | Ht 67.5 in | Wt 137.0 lb

## 2023-09-25 DIAGNOSIS — I4891 Unspecified atrial fibrillation: Secondary | ICD-10-CM | POA: Diagnosis not present

## 2023-09-25 DIAGNOSIS — E119 Type 2 diabetes mellitus without complications: Secondary | ICD-10-CM | POA: Diagnosis not present

## 2023-09-25 DIAGNOSIS — I509 Heart failure, unspecified: Secondary | ICD-10-CM

## 2023-09-25 DIAGNOSIS — J9611 Chronic respiratory failure with hypoxia: Secondary | ICD-10-CM | POA: Diagnosis not present

## 2023-09-25 DIAGNOSIS — I10 Essential (primary) hypertension: Secondary | ICD-10-CM

## 2023-09-26 ENCOUNTER — Other Ambulatory Visit: Payer: Self-pay

## 2023-09-26 NOTE — Patient Outreach (Signed)
 Complex Care Management   Visit Note  09/26/2023  Name:  Ryan Bernard MRN: [id number] DOB: [birth year]  Situation: Referral received for Complex Care Management related to Heart Failure I obtained verbal consent from Patient.  Visit completed with patient  on the phone  Background:   Past Medical History:  Diagnosis Date   Anemia    Arthritis    BPH (benign prostatic hyperplasia)    CAD (coronary artery disease) 03/11/1993   w/ angioplasty   CHF (congestive heart failure) (HCC)    Chronic kidney disease    Chronic obstructive pulmonary disease (HCC) 12/17/2021   Chronic obstructive pulmonary disease (HCC) 12/17/2021   Diabetes mellitus 03/11/2002   type 2   Heart disease    Mild valvular w/ pulm HTN   Heart murmur    Hyperlipidemia    Hypertension    SBE (subacute bacterial endocarditis) prophylaxis candidate    Well adult exam 12/17/2021    Assessment: Patient Reported Symptoms:  Cognitive Cognitive Status: Alert and oriented to person, place, and time, Normal speech and language skills, Insightful and able to interpret abstract concepts Cognitive/Intellectual Conditions Management [RPT]: None reported or documented in medical history or problem list   Health Maintenance Behaviors: Annual physical exam, Immunizations, Sleep adequate, Healthy diet Health Facilitated by: Healthy diet  Neurological Neurological Review of Symptoms: No symptoms reported    HEENT HEENT Symptoms Reported: Change or loss of hearing HEENT Management Strategies: Coping strategies, Routine screening HEENT Comment: VERY hard of hearing    Cardiovascular Cardiovascular Symptoms Reported: No symptoms reported Does patient have uncontrolled Hypertension?: No Cardiovascular Management Strategies: Medication therapy, Diet modification, Coping strategies, Adequate rest, Routine screening, Weight management Do You Have a Working Readable Scale?: Yes Cardiovascular Self-Management Outcome: 4  (good) Cardiovascular Comment: Patient states he saw his Cardiologist, Dr. Alvia yesterday 7/17 and does not need to see him for another 6 months because his blood pressure was good, his weight was stable, and patient stated his MD was pleased with his health self-management.  Respiratory Respiratory Symptoms Reported: No symptoms reported    Endocrine Endocrine Symptoms Reported: No symptoms reported Is patient diabetic?: Yes Is patient checking blood sugars at home?: Yes List most recent blood sugar readings, include date and time of day: latest A1c = 7.7 (05/27/23) Endocrine Self-Management Outcome: 4 (good)  Gastrointestinal Gastrointestinal Symptoms Reported: No symptoms reported   Nutrition Risk Screen (CP): No indicators present  Genitourinary Genitourinary Symptoms Reported: Frequency Genitourinary Comment: Patient attributes his urinary frequency to his need to take diuretic for his CHF - realizes it is more important to protect his heart & prevent CHF exacerbation than the minor inconveniece of having to take more trips to the bathroom  Integumentary Integumentary Symptoms Reported: No symptoms reported    Musculoskeletal Musculoskelatal Symptoms Reviewed: Weakness, Limited mobility   Falls in the past year?: No Fall risk Follow up: Falls prevention discussed  Psychosocial Psychosocial Symptoms Reported: No symptoms reported Behavioral Health Self-Management Outcome: 5 (very good)   Quality of Family Relationships: involved, helpful, supportive Do you feel physically threatened by others?: No      07/24/2023    4:28 PM  Depression screen PHQ 2/9  Decreased Interest 0  Down, Depressed, Hopeless 0  PHQ - 2 Score 0    There were no vitals filed for this visit.  Medications Reviewed Today     Reviewed by Gordy Channing LABOR, RN (Registered Nurse) on 09/26/23 at 1310  Med List Status: <None>   Medication Order  Taking? Sig Documenting Provider Last Dose Status Informant   albuterol  (PROVENTIL  HFA;VENTOLIN  HFA) 108 (90 Base) MCG/ACT inhaler [id number] Yes Inhale 2 puffs into the lungs every 6 (six) hours as needed for wheezing. [name], DO  Active   AMBULATORY NON FORMULARY MEDICATION [id number] Yes Portable Oxygen  concentrator  Titrate for POC 2 L [place] start  Sat walking on room air 82% sat walking with 2L [place] 97% Dispense: 1 each, Refills: 0 ordered [name], DO  Active            Med Note [name], RHONDA A   Thu Jul 31, 2023  2:03 PM) Patient states he failed the POC test and will not be getting the POC  AMBULATORY NON FORMULARY MEDICATION [id number] Yes Please complete POC evaluation. Dx: J96.10, I50.20 [name], DO  Active   apixaban  (ELIQUIS ) 5 MG TABS tablet [id number] Yes Take 5 mg by mouth 2 (two) times daily. [provider]  Active   cyanocobalamin 100 MCG tablet [id number] Yes Take 100 mcg by mouth daily. [provider]  Active   dapagliflozin propanediol (FARXIGA) 10 MG TABS tablet [id number] Yes Take 1 tablet by mouth daily. [provider]  Active   furosemide  (LASIX ) 20 MG tablet [id number] Yes Take 1 tablet (20 mg total) by mouth daily. [name], [name], DO  Active            Med Note [facility], RHONDA A   Thu Aug 07, 2023  2:18 PM) Per Dr Bring [name] - Patient to take 20mg  daily and increase to 40mg  daily as needed for 5lbs weight gain in 24hours, edema, shortness of breath  ipratropium-albuterol  (DUONEB) 0.5-2.5 (3) MG/3ML SOLN [id number] Yes Take 3 mLs by nebulization every 6 (six) hours as needed. [provider]  Active   metoprolol  succinate (TOPROL -XL) 100 MG 24 hr tablet [id number] Yes Take 100 mg by mouth daily. Take with or immediately following a meal. [provider]  Active   pravastatin  (PRAVACHOL ) 80 MG tablet [id number] Yes Take 1 tablet (80 mg total) by mouth at bedtime. [name], DO  Active   sacubitril-valsartan (ENTRESTO) 24-26 [place] [id number] Yes Take 1  tablet by mouth 2 (two) times daily. [provider]  Active   spironolactone (ALDACTONE) 25 MG tablet [id number] Yes Take 1 tablet by mouth daily. [provider]  Active   terazosin (HYTRIN) 10 MG capsule [id number] Yes Take 10 mg by mouth daily.   [provider]  Active             Recommendation:   Continue Current Plan of Care  Follow Up Plan:   Telephone follow up appointment date/time:  Friday, 10/10/2023 at 1pm with RN Care Manager Channing Channing A. Gordy RN, BA, [facility], CRRN [place] Population Health RN Care Manager Direct Dial: [phone]  Fax: [phone]

## 2023-09-26 NOTE — Patient Instructions (Signed)
 Visit Information  Thank you for taking time to visit with me today. Please don't hesitate to contact me if I can be of assistance to you before our next scheduled telephone appointment.  Our next appointment is by telephone on 10/10/2023 at 1 pm.  Following is a copy of your care plan:   Goals Addressed             This Visit's Progress    VBCI RN Care Plan       Problems:  Chronic Disease Management support and education needs related to CHF  Goal: Over the next 5 months the Patient will continue to work with RN Care Manager and/or Social Worker to address care management and care coordination needs related to CHF as evidenced by adherence to care management team scheduled appointments      Interventions:   Heart Failure Interventions: Provided education on low sodium diet Discussed importance of daily weight and advised patient to weigh and record daily Reviewed role of diuretics in prevention of fluid overload and management of heart failure; Discussed the importance of keeping all appointments with provider Screening for signs and symptoms of depression related to chronic disease state  Assessed social determinant of health barriers  09/26/23 RNCM engagement - Patient states he saw his Cardiologist, Dr. Alvia yesterday 7/17 and does not need to see him for another 6 months because his blood pressure was good, his weight was stable, and patient stated his MD was pleased with his health self-management.  09/26/23 RNCM engagement - discussed continued need for O2 @ 2L to maintain oxygen  saturations >92. Patient validated his understanding of the importance of keeping his oxygen  on by reporting that the other day when he went out with family, he didn't realize his O2 was turned off, and noticed he wasn't breathing as well - sats had dropped to <90, realized the portable O2 was not switched on and quickly corrected that and sats came back up to 97% very quickly.  Patient  Self-Care Activities:  Attend all scheduled provider appointments Call pharmacy for medication refills 3-7 days in advance of running out of medications Call provider office for new concerns or questions  Perform all self care activities independently  Perform IADL's (shopping, preparing meals, housekeeping, managing finances) independently Take medications as prescribed   call office if I gain more than 2 pounds in one day or 5 pounds in one week do ankle pumps when sitting keep legs up while sitting track weight in diary use salt in moderation watch for swelling in feet, ankles and legs every day weigh myself daily develop a rescue plan follow rescue plan if symptoms flare-up eat more whole grains, fruits and vegetables, lean meats and healthy fats track symptoms and what helps feel better or worse dress right for the weather, hot or cold  Plan:  The patient has been provided with contact information for the care management team and has been advised to call with any health related questions or concerns.  Next appointment with RN Care Manager Channing is 10/10/23 at 1pm             Patient verbalizes understanding of instructions and care plan provided today and agrees to view in MyChart. Active MyChart status and patient understanding of how to access instructions and care plan via MyChart confirmed with patient.     The patient has been provided with contact information for the care management team and has been advised to call with any health related  questions or concerns.   Please call the care guide team at [phone] if you need to cancel or reschedule your appointment.   Please call 1-800-273-TALK (toll free, 24 hour hotline) if you are experiencing a Mental Health or Behavioral Health Crisis or need someone to talk to.  Tiesha Marich A. Gordy RN, BA, [facility], CRRN [place]  [facility] Population Health RN Care Manager Direct Dial: [phone]  Fax: [phone]

## 2023-09-28 NOTE — Assessment & Plan Note (Addendum)
 Remains on oxygen  most of the time.  Recommend continuation.

## 2023-09-28 NOTE — Progress Notes (Signed)
 Ryan Bernard - 89 y.o. male MRN [id number]  Date of birth: 01/04/1932  Subjective Chief Complaint  Patient presents with   Congestive Heart Failure    HPI Ryan Bernard is a 89 y.o. male here today for follow-up visit.  He reports that he is doing pretty well.  Continues on oxygen  throughout the day.  He has not had any worsening dyspnea, chest pain or tightness or noticeable edema.  He is compliant with current medications.  He does also continue to see cardiology.  ROS:  A comprehensive ROS was completed and negative except as noted per HPI  No Known Allergies  Past Medical History:  Diagnosis Date   Anemia    Arthritis    BPH (benign prostatic hyperplasia)    CAD (coronary artery disease) 03/11/1993   w/ angioplasty   CHF (congestive heart failure) (HCC)    Chronic kidney disease    Chronic obstructive pulmonary disease (HCC) 12/17/2021   Chronic obstructive pulmonary disease (HCC) 12/17/2021   Diabetes mellitus 03/11/2002   type 2   Heart disease    Mild valvular w/ pulm HTN   Heart murmur    Hyperlipidemia    Hypertension    SBE (subacute bacterial endocarditis) prophylaxis candidate    Well adult exam 12/17/2021    Past Surgical History:  Procedure Laterality Date   ANGIOPLASTY  03/11/1993   [facility]   BACK SURGERY  03/11/1962   CORONARY ARTERY BYPASS GRAFT  10/27/2009   5 vessel, Dr. Marty Search   EYE SURGERY     HERNIA REPAIR     x 2     Social History   Socioeconomic History   Marital status: Widowed    Spouse name: Not on file   Number of children: 2   Years of education: 10   Highest education level: 12th grade  Occupational History   Occupation: retired    Comment: Insurance claims handler  Tobacco Use   Smoking status: Never   Smokeless tobacco: Never  Vaping Use   Vaping status: Never Used  Substance and Sexual Activity   Alcohol use: No   Drug use: No   Sexual activity: Not Currently  Other Topics Concern   Not on file  Social History  Narrative   Lives with his son. He has two children. Sits around house and watches TV and walks in the yard.   Social Drivers of Corporate investment banker Strain: Low Risk  (09/09/2023)   Received from [facility]   Overall Financial Resource Strain (CARDIA)    Difficulty of Paying Living Expenses: Not hard at all  Food Insecurity: No Food Insecurity (09/26/2023)   Hunger Vital Sign    Worried About Running Out of Food in the Last Year: Never true    Ran Out of Food in the Last Year: Never true  Transportation Needs: No Transportation Needs (09/26/2023)   PRAPARE - Administrator, Civil Service (Medical): No    Lack of Transportation (Non-Medical): No  Physical Activity: Sufficiently Active (05/20/2023)   Exercise Vital Sign    Days of Exercise per Week: 6 days    Minutes of Exercise per Session: 40 min  Stress: No Stress Concern Present (07/07/2023)   Received from [facility] - Occupational Stress Questionnaire    Feeling of Stress : Only a little  Social Connections: Moderately Integrated (09/26/2023)   Social Connection and Isolation Panel    Frequency of Communication  with Friends and Family: More than three times a week    Frequency of Social Gatherings with Friends and Family: More than three times a week    Attends Religious Services: More than 4 times per year    Active Member of [employer] or Organizations: Yes    Attends Banker Meetings: More than 4 times per year    Marital Status: Widowed    Family History  Problem Relation Age of Onset   Heart disease Mother    Hypertension Mother    Alcohol abuse Father    Depression Daughter    Depression Son     Health Maintenance  Topic Date Due   FOOT EXAM  06/18/2023   HEMOGLOBIN A1C  06/18/2023   COVID-19 Vaccine (8 - Mixed Product risk 2024-25 season) 06/18/2023   INFLUENZA VACCINE  10/10/2023   Medicare Annual Wellness (AWV)  05/19/2024    OPHTHALMOLOGY EXAM  08/20/2024   DTaP/Tdap/Td (4 - Td or Tdap) 10/09/2025   Pneumococcal Vaccine: 50+ Years  Completed   Zoster Vaccines- Shingrix  Completed   Hepatitis B Vaccines  Aged Out   HPV VACCINES  Aged Out   Meningococcal B Vaccine  Aged Out     ----------------------------------------------------------------------------------------------------------------------------------------------------------------------------------------------------------------- Physical Exam BP 131/69 (BP Location: Left Arm, Patient Position: Sitting, Cuff Size: Small)   Pulse 76   Ht 5' 7.5 (1.715 m)   Wt 137 lb (62.1 kg)   SpO2 96%   BMI 21.14 kg/m   Physical Exam Constitutional:      Appearance: Normal appearance.  HENT:     Head: Normocephalic and atraumatic.  Eyes:     General: No scleral icterus. Cardiovascular:     Rate and Rhythm: Normal rate and regular rhythm.  Pulmonary:     Effort: Pulmonary effort is normal.     Breath sounds: Normal breath sounds.  Neurological:     General: No focal deficit present.     Mental Status: He is alert.  Psychiatric:        Mood and Affect: Mood normal.        Behavior: Behavior normal.     ------------------------------------------------------------------------------------------------------------------------------------------------------------------------------------------------------------------- Assessment and Plan  Chronic respiratory failure (HCC) Remains on oxygen  most of the time.  Recommend continuation.  Controlled type 2 diabetes mellitus without complication (HCC) Lab Results  Component Value Date   HGBA1C 6.4 12/18/2022   Blood sugars have been well-controlled.  Remains on Farxiga.  Atrial fibrillation (HCC) Chronic in nature.  Anticoagulated with Eliquis .  Rate control with metoprolol .  Overall stable at this time.  Essential hypertension, benign Overall blood pressure stable.  Will plan to continue current  medications.  Acute on chronic congestive heart failure (HCC) Euvolemic on exam.  Current medications are being managed by cardiology.  He will remain on Entresto with Aldactone and furosemide  as needed.   No orders of the defined types were placed in this encounter.   Return in about 6 months (around 03/27/2024) for Hypertension.

## 2023-09-28 NOTE — Assessment & Plan Note (Signed)
 Chronic in nature.  Anticoagulated with Eliquis.  Rate control with metoprolol.  Overall stable at this time.

## 2023-09-28 NOTE — Assessment & Plan Note (Signed)
 Overall blood pressure stable.  Will plan to continue current medications.

## 2023-09-28 NOTE — Assessment & Plan Note (Signed)
 Euvolemic on exam.  Current medications are being managed by cardiology.  He will remain on Entresto with Aldactone and furosemide  as needed.

## 2023-09-28 NOTE — Assessment & Plan Note (Signed)
 Lab Results  Component Value Date   HGBA1C 6.4 12/18/2022   Blood sugars have been well-controlled.  Remains on Farxiga.

## 2023-09-29 DIAGNOSIS — Z7409 Other reduced mobility: Secondary | ICD-10-CM | POA: Diagnosis not present

## 2023-09-29 DIAGNOSIS — I482 Chronic atrial fibrillation, unspecified: Secondary | ICD-10-CM | POA: Diagnosis not present

## 2023-09-30 ENCOUNTER — Telehealth: Payer: Self-pay

## 2023-09-30 MED ORDER — AMBULATORY NON FORMULARY MEDICATION: 0 refills | Status: DC

## 2023-09-30 NOTE — Telephone Encounter (Signed)
 Copied from CRM [id number]. Topic: Clinical - Medical Advice >> Sep 29, 2023 11:01 AM Laurier BROCKS wrote: Reason for CRM: Patients daughter Derae wants to know how she would go about getting a bedside commode (DME supplies) for her father. She would like a call back at [phone]

## 2023-09-30 NOTE — Addendum Note (Signed)
 Addended by: Akai Dollard E on: 09/30/2023 04:44 PM   Modules accepted: Orders

## 2023-09-30 NOTE — Telephone Encounter (Signed)
 Returned daughter's call. Advised an RX for bedside commode can be written. PCK can send to DME for the equipment or the family can pickup the Rx.  LVM requesting callback with families determination.

## 2023-10-10 ENCOUNTER — Other Ambulatory Visit: Payer: Self-pay

## 2023-10-13 NOTE — Patient Outreach (Signed)
 Complex Care Management   Visit Note  10/10/2023  Name:  Ryan Bernard MRN: [id number] DOB: 02-25-1932  Situation: Referral received for Complex Care Management related to Heart Failure I obtained verbal consent from Patient.  Visit completed with patient  on the phone  Background:   Past Medical History:  Diagnosis Date   Anemia    Arthritis    BPH (benign prostatic hyperplasia)    CAD (coronary artery disease) 03/11/1993   w/ angioplasty   CHF (congestive heart failure) (HCC)    Chronic kidney disease    Chronic obstructive pulmonary disease (HCC) 12/17/2021   Chronic obstructive pulmonary disease (HCC) 12/17/2021   Diabetes mellitus 03/11/2002   type 2   Heart disease    Mild valvular w/ pulm HTN   Heart murmur    Hyperlipidemia    Hypertension    SBE (subacute bacterial endocarditis) prophylaxis candidate    Well adult exam 12/17/2021    Assessment: Patient Reported Symptoms:  Cognitive Cognitive Status: Alert and oriented to person, place, and time, Normal speech and language skills, Insightful and able to interpret abstract concepts Cognitive/Intellectual Conditions Management [RPT]: None reported or documented in medical history or problem list   Health Maintenance Behaviors: Annual physical exam, Immunizations, Sleep adequate, Healthy diet Health Facilitated by: Healthy diet, Rest  Neurological Neurological Review of Symptoms: No symptoms reported    HEENT HEENT Symptoms Reported: Change or loss of hearing HEENT Management Strategies: Coping strategies, Routine screening    Cardiovascular Cardiovascular Symptoms Reported: No symptoms reported Does patient have uncontrolled Hypertension?: No (states 10/10/23 am BP = 111/72) Cardiovascular Management Strategies: Medication therapy, Diet modification, Coping strategies, Adequate rest, Routine screening, Weight management Do You Have a Working Readable Scale?: Yes Weight: 135 lb (61.2 kg) Cardiovascular  Self-Management Outcome: 4 (good)  Respiratory Respiratory Symptoms Reported: No symptoms reported    Endocrine Endocrine Symptoms Reported: No symptoms reported Is patient diabetic?: Yes Is patient checking blood sugars at home?: Yes List most recent blood sugar readings, include date and time of day: states am blood sugar on 10/10/23 = 120 Endocrine Self-Management Outcome: 4 (good)  Gastrointestinal Gastrointestinal Symptoms Reported: No symptoms reported      Genitourinary Genitourinary Symptoms Reported: No symptoms reported    Integumentary Integumentary Symptoms Reported: No symptoms reported    Musculoskeletal Musculoskelatal Symptoms Reviewed: Limited mobility   Falls in the past year?: No    Psychosocial Psychosocial Symptoms Reported: No symptoms reported            07/24/2023    4:28 PM  Depression screen PHQ 2/9  Decreased Interest 0  Down, Depressed, Hopeless 0  PHQ - 2 Score 0    Vitals:   10/13/23 0024  BP: 111/72    Medications Reviewed Today   Medications were not reviewed in this encounter     Recommendation:   Continue Current Plan of Care  Follow Up Plan:   Telephone follow up appointment date/time:  11/07/2023 at 1pm with RN Care Manager Channing LITTIE Channing A. Gordy RN, BA, [facility], CRRN [place]  [facility] Population Health RN Care Manager Direct Dial: [phone]  Fax: [phone]

## 2023-10-16 ENCOUNTER — Telehealth: Payer: Self-pay | Admitting: Family Medicine

## 2023-10-16 NOTE — Telephone Encounter (Signed)
 Patient dropped off document Physician Verification, to be filled out by provider. Patient requested to send it back via Call Patient to pick up within ASAP. Document is located in providers tray at front office.Please advise at [facility] [phone]

## 2023-10-23 ENCOUNTER — Telehealth: Payer: Self-pay

## 2023-10-23 NOTE — Telephone Encounter (Signed)
 Per front desk. Patient's daughter has picked up the completed form.

## 2023-10-23 NOTE — Telephone Encounter (Signed)
 Patient daughter Nellene Lemmings calling for patient  Regarding form completion for electric supply continuation in outage.  Will check on this and give her a call back once completed.

## 2023-10-24 DIAGNOSIS — I255 Ischemic cardiomyopathy: Secondary | ICD-10-CM | POA: Diagnosis not present

## 2023-10-24 DIAGNOSIS — I482 Chronic atrial fibrillation, unspecified: Secondary | ICD-10-CM | POA: Diagnosis not present

## 2023-10-24 NOTE — Progress Notes (Signed)
 Subjective:    Patient ID: Ryan Bernard is a 89 y.o. (DOB [birth year]) male.  Ryan Bernard presents for follow up visit.  Heart failure with reduced ejection fraction.  Patient reports improved assess tolerance.  He does not require nasal oxygen  throughout the day.  His weight is down well post recent increase in diuretic dose.  Blood pressure readings controlled on Entresto.  Chronic atrial fibrillation on Eliquis .  No symptomatic palpitations.  No bleeding complications on Eliquis .  08/2023- Heart failure with reduced EF. Chronic atrial fibrillation on Eliquis . B/P controlled this visit. Dyspnea with mild exertion.   06/2023- Patient presents for follow up post recent hospital admissions for heart failure. Patient seen by Megan Hulen last month. Ischemic cardiomyopathy with recent EF 35%. B/P controlled this visit. Patient on nasal oxygen  this visit.   02/2023- Ischemic cardiomyopathy with reduced ejection fraction.  Patient denies chest pain.  Occasional orthopnea no significant weight gain or pedal edema.  Patient developed orthostatic symptoms with daily Lasix  use.  Blood pressure controlled this visit.  Chronic atrial fibrillation on Eliquis .  Moderate aortic stenosis last echocardiogram.  Patient is ambulatory with a cane.  Mildly productive cough but no respiratory distress.  08/2022- Patient denies cardiac symptoms or events since his last visit.  Recent car trip to [place]  without significant problems.  Echo today LVEF 35-40%.  Moderate aortic stenosis without change in gradient across the valve.  Chronic atrial fibrillation on Eliquis  anticoagulation.  Coronary artery disease post coronary intervention of the saphenous vein graft to the right coronary artery in 2022.  02/2022-  Ischemic cardiomyopathy, LV EF 35-40% on echo 03/2021. Chronic atrial fibrillation on eliquis . Hypertension controlled this visit. Moderate aortic stenosis. Low flow/low gradient aortic stenosis. Patient denies cardiac  complaints. No chest pain, shortness of breath or palpitations. No bleeding complication on Eliquis .  11/2021- Patient denies cardiac symptoms or events since last visit. Ischemic cardiomyopathy. Moderate aortic stenosis. Chronic atrial fibrillation on Eliquis . B/P readings controlled this visit.  Patient seen at the [facility] last month with no medication changes.   08/2021- Patient reports easy fatigue. No chest pain or palpitations. Losartan removed from medication list. Chronic atrial fibrillation on Eliquis . Ischemic cardiomyopathy on Toprol  XL 200 mg daily, Farxiga and Aldactone. B/P readings controlled over past month. Echo 03/2021 LV EF 35-40%. Moderate aortic stenosis without recent change in symptoms.   05/2021- Patient denies chest pain or shortness of breath at rest. B/P readings controlled over last month. Echo 03/2021 LV EF  35-40%.  03/2021- Heart rate and B/P elevated this AM. Weight down 6-7 pounds since last visit. No chest pain or shortness of breath. EKG atrial fibrillation at rate 109 bpm.  11/2020- History of ischemic cardiomyopathy ( LV EF 35-40% on echo 06/2020) post CABG surgery in 2011. PCI SVG to RCA in 02/2020. Moderate aortic stenosis. Chronic atrial fibrillation on Eliquis .  B/P and weight stable. Farxiga and Aldactone started last visit. Last lab work pro BNP level 4500. Patient reports improved exercise tolerance. Diuretic dose decreased with orthostatic symptoms. Patient denies chest pain or palpitations. 09/2020- Patient reports dyspnea on exertion. PCP last week lab work pro BNP level 6000. No distress today. Recent dobutamine stress echo revealed moderate aortic stenosis, AVA 1.3 cm2. B/P controlled this visit.  06/2020- Ryan Bernard presents to discuss recent echo findings. Patient complains of dyspnea on exertion with worsening activity level. No significant improvemt in LV EF post PCI SVG/RCA in 02/2920. Possible low flow/low gradient aortic stenosis on recent echo. LV  EF 30-35%  with pro BNP level 4300 in 03/2020. Chronic atrial fibrillation on Eliquis . 06/2020- Ryan Bernard presents for Cardiology follow up. Echo today revealed LV EF 30-35% with moderate diffuse hypokinesis. Mild/moderate aortic stenosis. Patient reports dyspnea on exertion. B/P readings controlled. Heart rate variable in atrial fibrillation. Mild bruising on Eliquis /Plavix.  03/2020- Ryan Bernard presents for follow up visit post recent procedure. Patient underwent coronary intervention of SVG to RCA on 12/1 by Dr. Arloa. LIMA graft to LAD and SVG to Diagonal patent. SVG to LCX/OM occluded. Patient reports improved exercise tolerance, No complication at the right groin access site. Patient on Plavix 75 mg daily and Eliquis  5 mg bid. B/P controlled this visit. B/P controlled this visit. Chronic atrial fibrillation on Eliquis . 01/2020- History of ischemic cardiomyopathy post CABG surgery in 2011. Recent abnormal Cardiolite SPECT study revealed a large perfusion abnormality. Chronic atrial fibrillation on Eliquis . B/P controlled this visit. No bleeding complication on Eliquis . Patient reports dyspnea on exertion.   07/2018- Ryan Bernard presents with recently noted persistent atrial fibrillation. Eliquis  started for stroke prophylaxis. History of ischemic cardiomyopathy post CABG surgery in 2012. B/P uncontrolled this visit. Echo 2019 revealed LV EF 35-40% with moderate mitral regurgitation. Patient denies chest pain or worsening shortness of breath. EKG today reveals atrial fibrillation at rate 92 beats/min. Incomplete LBBB pattern. Patient denies bleeding complication on Eliquis . Patient also followed at the [facility].    Studies  Echocardiogram 05/2023   Left Ventricle: Systolic function is severely abnormal. EF: 30-35%. Ejection fraction measured by 3D is 35%, which is abnormal.   Left Atrium: Left atrium is moderately dilated.   Right Ventricle: Right ventricle size is normal. Systolic function is mildly  reduced.   Aortic Valve: Mild to moderate aortic valve regurgitation with centrally directed jet.   Mitral Valve: There is mild to moderate regurgitation with a centrally directed jet.   Tricuspid Valve: The right ventricular systolic pressure is moderately elevated (50-59 mmHg).  Echocardiogram 08/2022   Left Ventricle: Systolic function is moderately abnormal. EF: 35-40%.   Left Ventricle: There is mild hypertrophy.   Left Ventricle: There is moderate  hypokinesis of the left ventricle.   Left Atrium: Left atrium is moderately dilated at 4.300 cmLeft atrium volume index is severely increased (>48 mL/m2).   Aortic Valve: There is mild to moderate stenosis, with peak and mean gradients of 23.000 and 12.000 mmHg. AV Vmax 2.4 m/s. AVA 0.90 cm2.   Aortic Valve: Mild aortic valve regurgitation.   Aortic Valve: The leaflets are mildly thickened and exhibit moderately reduced excursion.   Echocardiogram 05/2019   Left Ventricle: Moderately dilated left ventricle. There is mild concentric hypertrophy. Systolic function is moderately abnormal. EF: 35-40%. There is moderate  hypokinesis of the left ventricle. Doppler parameters are indeterminate for diastolic function.   Aortic Valve: Normal tricuspid aortic valve. The leaflets exhibit moderately reduced excursion. The leaflets are mildly calcified. There is moderate stenosis.   Congestive Heart Failure Presents for follow-up visit. Pertinent negatives include no abdominal pain, chest pain, chest pressure, claudication, edema, fatigue, muscle weakness, near-syncope, nocturia, orthopnea, palpitations, paroxysmal nocturnal dyspnea, shortness of breath or unexpected weight change. The symptoms have been improving. Compliance with total regimen is 76-100%. Compliance with diet is 76-100%. Compliance with exercise is 76-100%. Compliance with medications is 76-100%.     Reviewed and updated this visit by provider: Tobacco  Allergies  Meds  Med  Hx  SE Hx  Surg Hx  Fam Hx  Soc Hx  Review of Systems  Constitutional:  Negative for fatigue and unexpected weight change.  HENT:  Negative for nosebleeds.   Eyes:  Negative for visual disturbance.  Respiratory:  Negative for apnea and shortness of breath.   Cardiovascular:  Negative for chest pain, palpitations, claudication and near-syncope.  Gastrointestinal:  Negative for abdominal pain, anal bleeding and blood in stool.  Genitourinary:  Negative for hematuria and nocturia.  Musculoskeletal:  Negative for muscle weakness.  Neurological:  Negative for speech difficulty and light-headedness.  Hematological:  Does not bruise/bleed easily.    Lab Results  Component Value Date   WBC 7.6 07/09/2023   HGB 12.6 (L) 07/09/2023   HCT 38.7 (L) 07/09/2023   Plt Ct 152 07/09/2023   Cholesterol, Total 114 10/25/2009   Triglycerides 54 10/25/2009   HDL 44 10/25/2009   LDL Cholesterol 59 10/25/2009   ALT 10 07/07/2023   AST 17 07/07/2023   Sodium 145 (H) 08/26/2023   Potassium 4.1 08/26/2023   Chloride 99 08/26/2023   Creatinine 1.15 08/26/2023   BUN 12 08/26/2023   CO2 26 08/26/2023   TSH 3.51 04/25/2023   INR 1.0 05/26/2017   Glucose 174 (H) 08/26/2023   Hemoglobin A1c 7.7 (H) 05/27/2023      Objective:   There were no vitals filed for this visit.  Physical Exam Constitutional:      General: He is not in acute distress.    Appearance: He is not ill-appearing.  HENT:     Head: Atraumatic.  Eyes:     Pupils: Pupils are equal, round, and reactive to light.  Cardiovascular:     Rate and Rhythm: Rhythm irregularly irregular.     Chest Wall: PMI is displaced.     Heart sounds: S1 normal and S2 normal. Murmur heard.     Systolic murmur is present with a grade of 1/6.  Musculoskeletal:     Right lower leg: No edema.     Left lower leg: No edema.  Pulmonary:     Effort: No respiratory distress.     Breath sounds: No wheezing or rales.  Abdominal:     General:  There is no distension.     Tenderness: There is no abdominal tenderness.  Skin:    General: Skin is warm.  Neurological:     General: No focal deficit present.     Mental Status: He is oriented to person, place, and time.       Assessment / Plan:   Assessment 1. Ischemic cardiomyopathy   2. Chronic atrial fibrillation (*)          Plan No medication changes this visit. B/P goal < 130/80 Return in 3 months          Patient's Medications       * Accurate as of October 24, 2023 10:45 AM. Reflects encounter med changes as of last refresh          Continued Medications      Instructions  albuterol  sulfate HFA 108 (90 Base) MCG/ACT inhaler Commonly known as: PROVENTIL ,VENTOLIN ,PROAIR   2 puffs, Every 6 hours as needed respiratory   apixaban  5 mg tablet Commonly known as: ELIQUIS   5 mg, Oral, 2 times a day   B-12 100 MCG Tabs  1 each, Daily   dapagliflozin 10 mg tablet Commonly known as: FARXIGA  10 mg, Daily   * ENTRESTO 24-26 MG Tabs per tablet Generic drug: sacubitril-valsartan  1 tablet, Oral, 2 times a day   *  ENTRESTO 24-26 MG Tabs per tablet Generic drug: sacubitril-valsartan  1 tablet, Oral, 2 times a day   furosemide  20 mg tablet Commonly known as: LASIX   40 mg, Oral, Daily as needed   * ipratropium-albuterol  0.5-2.5 mg/3 mL ML Soln nebulizer solution Commonly known as: DUONEB  3 mLs, Every 6 hours as needed   * ipratropium-albuterol  0.5-2.5 mg/3 mL ML Soln nebulizer solution Commonly known as: DUONEB  1 ampule, Every 6 hours   metoprolol  succinate 100 mg 24 hr tablet Commonly known as: TOPROL -XL  1 tablet, 2 times a day   pravastatin  80 MG tablet Commonly known as: PRAVACHOL   80 mg, Daily   spironolactone 25 mg tablet Commonly known as: ALDACTONE  12.5 mg, Oral, Daily   terazosin 10 MG capsule Commonly known as: HYTRIN  10 mg, At bedtime      * * This list has 4 medication(s) that are the same as other medications  prescribed for you. Read the directions carefully, and ask your doctor or other care provider to review them with you.           No orders of the defined types were placed in this encounter.   Risks, benefits, and alternatives of the medications and treatment plan prescribed today were discussed, and patient expressed understanding. Plan follow-up as discussed or as needed if any worsening symptoms or change in condition.         *Some images could not be shown.

## 2023-10-30 DIAGNOSIS — I482 Chronic atrial fibrillation, unspecified: Secondary | ICD-10-CM | POA: Diagnosis not present

## 2023-10-30 DIAGNOSIS — Z7409 Other reduced mobility: Secondary | ICD-10-CM | POA: Diagnosis not present

## 2023-11-07 ENCOUNTER — Other Ambulatory Visit: Payer: Self-pay

## 2023-11-07 NOTE — Patient Outreach (Signed)
 Complex Care Management   Visit Note  11/07/2023  Name:  Ryan Bernard MRN: [id number] DOB: 07-30-1931  Situation: Referral received for Complex Care Management related to Heart Failure I obtained verbal consent from Patient.  Visit completed with Patient  on the phone  Background:   Past Medical History:  Diagnosis Date   Anemia    Arthritis    BPH (benign prostatic hyperplasia)    CAD (coronary artery disease) 03/11/1993   w/ angioplasty   CHF (congestive heart failure) (HCC)    Chronic kidney disease    Chronic obstructive pulmonary disease (HCC) 12/17/2021   Chronic obstructive pulmonary disease (HCC) 12/17/2021   Diabetes mellitus 03/11/2002   type 2   Heart disease    Mild valvular w/ pulm HTN   Heart murmur    Hyperlipidemia    Hypertension    SBE (subacute bacterial endocarditis) prophylaxis candidate    Well adult exam 12/17/2021    Assessment: Patient Reported Symptoms:  Cognitive Cognitive Status: Alert and oriented to person, place, and time, Normal speech and language skills, Insightful and able to interpret abstract concepts Cognitive/Intellectual Conditions Management [RPT]: None reported or documented in medical history or problem list   Health Maintenance Behaviors: Annual physical exam, Immunizations, Sleep adequate, Healthy diet Health Facilitated by: Healthy diet, Rest  Neurological Neurological Review of Symptoms: No symptoms reported    HEENT HEENT Symptoms Reported: Change or loss of hearing HEENT Management Strategies: Coping strategies, Routine screening HEENT Comment: VERY hard of hearing    Cardiovascular Cardiovascular Symptoms Reported: No symptoms reported Does patient have uncontrolled Hypertension?: No (well controlled on Entresto) Cardiovascular Management Strategies: Medication therapy, Diet modification, Coping strategies, Adequate rest, Routine screening, Weight management Do You Have a Working Readable Scale?: Yes Weight: 132 lb  (59.9 kg) Cardiovascular Self-Management Outcome: 4 (good) Cardiovascular Comment: Patient saw his Cardiologist Dr. Jacqualyn at [facility] regrding his heart failure w/ reduced ejection fraction and got a good report from his Cardiologist that he was doing well - patient is reporting increased activity tolerance, BP controlled on well Entresto, continues on Eliquis  for Chronic AFib, weight is down to desirable range after recent increase in diuretic  Respiratory Respiratory Symptoms Reported: No symptoms reported    Endocrine Endocrine Symptoms Reported: No symptoms reported Is patient diabetic?: Yes Endocrine Self-Management Outcome: 4 (good)  Gastrointestinal Gastrointestinal Symptoms Reported: No symptoms reported   Nutrition Risk Screen (CP): No indicators present  Genitourinary Genitourinary Symptoms Reported: Frequency Additional Genitourinary Details: patient states his frequency is due to his need to take diuretic for CHF management    Integumentary Integumentary Symptoms Reported: No symptoms reported    Musculoskeletal Musculoskelatal Symptoms Reviewed: Limited mobility   Falls in the past year?: No Number of falls in past year: 1 or less Was there an injury with Fall?: No Fall Risk Category Calculator: 0 Patient Fall Risk Level: Low Fall Risk Patient at Risk for Falls Due to: Medication side effect, Other (Comment) (BP runs low at times - counseled to get up from lying/sitting position slowly which patient states he does do to prevent orthostatic dizziness) Fall risk Follow up: Falls evaluation completed, Education provided, Falls prevention discussed  Psychosocial Psychosocial Symptoms Reported: No symptoms reported          11/07/2023    PHQ2-9 Depression Screening   Little interest or pleasure in doing things    Feeling down, depressed, or hopeless    PHQ-2 - Total Score    Trouble falling or staying asleep,  or sleeping too much    Feeling tired or having  little energy    Poor appetite or overeating     Feeling bad about yourself - or that you are a failure or have let yourself or your family down    Trouble concentrating on things, such as reading the newspaper or watching television    Moving or speaking so slowly that other people could have noticed.  Or the opposite - being so fidgety or restless that you have been moving around a lot more than usual    Thoughts that you would be better off dead, or hurting yourself in some way    PHQ2-9 Total Score    If you checked off any problems, how difficult have these problems made it for you to do your work, take care of things at home, or get along with other people    Depression Interventions/Treatment      Vitals:   11/07/23 1639  BP: 110/70    Medications Reviewed Today     Reviewed by Gordy Channing LABOR, RN (Registered Nurse) on 11/07/23 at 1337  Med List Status: <None>   Medication Order Taking? Sig Documenting Provider Last Dose Status Informant  albuterol  (PROVENTIL  HFA;VENTOLIN  HFA) 108 (90 Base) MCG/ACT inhaler [id number] Yes Inhale 2 puffs into the lungs every 6 (six) hours as needed for wheezing. [name], DO  Active   AMBULATORY NON FORMULARY MEDICATION [id number] Yes Portable Oxygen  concentrator  Titrate for POC 2 L [place] start  Sat walking on room air 82% sat walking with 2L [place] 97% Dispense: 1 each, Refills: 0 ordered [name], DO  Active            Med Note [name], RHONDA A   Thu Jul 31, 2023  2:03 PM) Patient states he failed the POC test and will not be getting the POC  AMBULATORY NON FORMULARY MEDICATION [id number] Yes Please complete POC evaluation. Dx: [address] I50.20 [name], DO  Active   [name] MEDICATION [id number] Yes Please provide bedside toilet. Z91.81, J96.10 [name], DO  Active   apixaban  (ELIQUIS ) 5 MG TABS tablet [id number] Yes Take 5 mg by mouth 2 (two) times daily. [provider]  Active   cyanocobalamin 100  MCG tablet [id number] Yes Take 100 mcg by mouth daily. [provider]  Active   dapagliflozin propanediol (FARXIGA) 10 MG TABS tablet [id number] Yes Take 1 tablet by mouth daily. [provider]  Active   furosemide  (LASIX ) 20 MG tablet [id number] Yes Take 1 tablet (20 mg total) by mouth daily. [name], [name], DO  Active            Med Note [facility], RHONDA A   Thu Aug 07, 2023  2:18 PM) Per Dr Bring [name] - Patient to take 20mg  daily and increase to 40mg  daily as needed for 5lbs weight gain in 24hours, edema, shortness of breath  ipratropium-albuterol  (DUONEB) 0.5-2.5 (3) MG/3ML SOLN [id number] Yes Take 3 mLs by nebulization every 6 (six) hours as needed. [provider]  Active   metoprolol  succinate (TOPROL -XL) 100 MG 24 hr tablet [id number] Yes Take 100 mg by mouth daily. Take with or immediately following a meal. [provider]  Active   pravastatin  (PRAVACHOL ) 80 MG tablet [id number] Yes Take 1 tablet (80 mg total) by mouth at bedtime. [name], DO  Active   sacubitril-valsartan (ENTRESTO) 24-26 [place] [id number] Yes Take 1 tablet by mouth 2 (two) times daily. [provider]  Active   spironolactone (ALDACTONE) 25 MG tablet [id number] Yes Take 1 tablet by mouth daily. [provider]  Active   terazosin (HYTRIN) 10 MG capsule [id number] Yes Take 10 mg by mouth daily.   [provider]  Active             Recommendation:   Continue Current Plan of Care  Follow Up Plan:   Telephone follow up appointment date/time:  12/05/23 at 1pm with RN Care Manager Channing LITTIE Channing A. Gordy RN, BA, [facility], CRRN [place]  [facility] Population Health RN Care Manager Direct Dial: [phone]  Fax: [phone]

## 2023-11-07 NOTE — Patient Instructions (Signed)
 Visit Information  Thank you for taking time to visit with me today. Please don't hesitate to contact me if I can be of assistance to you before our next scheduled telephone appointment.  Our next appointment is by telephone on 12/05/23 at 1pm  Following is a copy of your care plan:   Goals Addressed             This Visit's Progress    VBCI RN Care Plan       Problems:  Chronic Disease Management support and education needs related to CHF  Goal: Over the next 3 months the Patient will continue to work with RN Care Manager and/or Social Worker to address care management and care coordination needs related to CHF as evidenced by adherence to care management team scheduled appointments      Interventions:   Heart Failure Interventions: Provided education on low sodium diet Discussed importance of daily weight and advised patient to weigh and record daily Reviewed role of diuretics in prevention of fluid overload and management of heart failure; Discussed the importance of keeping all appointments with provider Screening for signs and symptoms of depression related to chronic disease state  Assessed social determinant of health barriers  09/26/23 RNCM engagement - Patient states he saw his Cardiologist, Dr. Alvia yesterday 7/17 and does not need to see him for another 6 months because his blood pressure was good, his weight was stable, and patient stated his MD was pleased with his health self-management.  09/26/23 RNCM engagement - discussed continued need for O2 @ 2L to maintain oxygen  saturations >92. Patient validated his understanding of the importance of keeping his oxygen  on by reporting that the other day when he went out with family, he didn't realize his O2 was turned off, and noticed he wasn't breathing as well - sats had dropped to <90, realized the portable O2 was not switched on and quickly corrected that and sats came back up to 97% very quickly.  Patient Self-Care  Activities:  Attend all scheduled provider appointments Call pharmacy for medication refills 3-7 days in advance of running out of medications Call provider office for new concerns or questions  Perform all self care activities independently  Perform IADL's (shopping, preparing meals, housekeeping, managing finances) independently Take medications as prescribed   call office if I gain more than 2 pounds in one day or 5 pounds in one week do ankle pumps when sitting keep legs up while sitting track weight in diary use salt in moderation watch for swelling in feet, ankles and legs every day weigh myself daily develop a rescue plan follow rescue plan if symptoms flare-up eat more whole grains, fruits and vegetables, lean meats and healthy fats track symptoms and what helps feel better or worse dress right for the weather, hot or cold  Plan:  The patient has been provided with contact information for the care management team and has been advised to call with any health related questions or concerns.  Next appointment with RN Care Manager Channing is 12/05/23 at 1pm             Patient verbalizes understanding of instructions and care plan provided today and agrees to view in MyChart. Active MyChart status and patient understanding of how to access instructions and care plan via MyChart confirmed with patient.     The patient has been provided with contact information for the care management team and has been advised to call with any health related questions  or concerns.   Please call the care guide team at [phone] if you need to cancel or reschedule your appointment.   Please call 1-800-273-TALK (toll free, 24 hour hotline) if you are experiencing a Mental Health or Behavioral Health Crisis or need someone to talk to.  Dajanay Northrup A. Gordy RN, BA, [facility], CRRN [place]  [facility] Population Health RN Care Manager Direct Dial: [phone]  Fax: [phone]

## 2023-11-20 DIAGNOSIS — S022XXA Fracture of nasal bones, initial encounter for closed fracture: Secondary | ICD-10-CM | POA: Diagnosis not present

## 2023-11-20 DIAGNOSIS — W19XXXA Unspecified fall, initial encounter: Secondary | ICD-10-CM | POA: Diagnosis not present

## 2023-11-20 DIAGNOSIS — D6832 Hemorrhagic disorder due to extrinsic circulating anticoagulants: Secondary | ICD-10-CM | POA: Diagnosis not present

## 2023-11-20 DIAGNOSIS — R296 Repeated falls: Secondary | ICD-10-CM | POA: Diagnosis not present

## 2023-11-20 DIAGNOSIS — J9621 Acute and chronic respiratory failure with hypoxia: Secondary | ICD-10-CM | POA: Diagnosis not present

## 2023-11-20 DIAGNOSIS — J690 Pneumonitis due to inhalation of food and vomit: Secondary | ICD-10-CM | POA: Diagnosis not present

## 2023-11-20 DIAGNOSIS — I5023 Acute on chronic systolic (congestive) heart failure: Secondary | ICD-10-CM | POA: Diagnosis not present

## 2023-11-20 DIAGNOSIS — J984 Other disorders of lung: Secondary | ICD-10-CM | POA: Diagnosis not present

## 2023-11-20 DIAGNOSIS — J9611 Chronic respiratory failure with hypoxia: Secondary | ICD-10-CM | POA: Diagnosis not present

## 2023-11-20 DIAGNOSIS — N179 Acute kidney failure, unspecified: Secondary | ICD-10-CM | POA: Diagnosis not present

## 2023-11-20 DIAGNOSIS — Z9981 Dependence on supplemental oxygen: Secondary | ICD-10-CM | POA: Diagnosis not present

## 2023-11-20 DIAGNOSIS — J90 Pleural effusion, not elsewhere classified: Secondary | ICD-10-CM | POA: Diagnosis not present

## 2023-11-20 DIAGNOSIS — S199XXA Unspecified injury of neck, initial encounter: Secondary | ICD-10-CM | POA: Diagnosis not present

## 2023-11-20 DIAGNOSIS — S299XXA Unspecified injury of thorax, initial encounter: Secondary | ICD-10-CM | POA: Diagnosis not present

## 2023-11-20 DIAGNOSIS — I5022 Chronic systolic (congestive) heart failure: Secondary | ICD-10-CM | POA: Diagnosis not present

## 2023-11-20 DIAGNOSIS — I482 Chronic atrial fibrillation, unspecified: Secondary | ICD-10-CM | POA: Diagnosis not present

## 2023-11-20 DIAGNOSIS — D696 Thrombocytopenia, unspecified: Secondary | ICD-10-CM | POA: Diagnosis not present

## 2023-11-20 DIAGNOSIS — E119 Type 2 diabetes mellitus without complications: Secondary | ICD-10-CM | POA: Diagnosis not present

## 2023-11-20 DIAGNOSIS — E785 Hyperlipidemia, unspecified: Secondary | ICD-10-CM | POA: Diagnosis not present

## 2023-11-20 DIAGNOSIS — R040 Epistaxis: Secondary | ICD-10-CM | POA: Diagnosis not present

## 2023-11-21 DIAGNOSIS — R040 Epistaxis: Secondary | ICD-10-CM | POA: Diagnosis not present

## 2023-11-22 DIAGNOSIS — R040 Epistaxis: Secondary | ICD-10-CM | POA: Diagnosis not present

## 2023-11-23 DIAGNOSIS — R040 Epistaxis: Secondary | ICD-10-CM | POA: Diagnosis not present

## 2023-11-24 DIAGNOSIS — R040 Epistaxis: Secondary | ICD-10-CM | POA: Diagnosis not present

## 2023-11-25 DIAGNOSIS — R040 Epistaxis: Secondary | ICD-10-CM | POA: Diagnosis not present

## 2023-11-26 DIAGNOSIS — R040 Epistaxis: Secondary | ICD-10-CM | POA: Diagnosis not present

## 2023-11-27 DIAGNOSIS — I4891 Unspecified atrial fibrillation: Secondary | ICD-10-CM | POA: Diagnosis not present

## 2023-11-27 DIAGNOSIS — R040 Epistaxis: Secondary | ICD-10-CM | POA: Diagnosis not present

## 2023-11-28 ENCOUNTER — Telehealth: Payer: Self-pay

## 2023-11-28 DIAGNOSIS — J9621 Acute and chronic respiratory failure with hypoxia: Secondary | ICD-10-CM | POA: Diagnosis not present

## 2023-11-28 DIAGNOSIS — E119 Type 2 diabetes mellitus without complications: Secondary | ICD-10-CM | POA: Diagnosis not present

## 2023-11-28 DIAGNOSIS — N179 Acute kidney failure, unspecified: Secondary | ICD-10-CM | POA: Diagnosis not present

## 2023-11-28 DIAGNOSIS — S022XXD Fracture of nasal bones, subsequent encounter for fracture with routine healing: Secondary | ICD-10-CM | POA: Diagnosis not present

## 2023-11-28 DIAGNOSIS — I110 Hypertensive heart disease with heart failure: Secondary | ICD-10-CM | POA: Diagnosis not present

## 2023-11-28 DIAGNOSIS — I482 Chronic atrial fibrillation, unspecified: Secondary | ICD-10-CM | POA: Diagnosis not present

## 2023-11-28 DIAGNOSIS — I2510 Atherosclerotic heart disease of native coronary artery without angina pectoris: Secondary | ICD-10-CM | POA: Diagnosis not present

## 2023-11-28 DIAGNOSIS — I5022 Chronic systolic (congestive) heart failure: Secondary | ICD-10-CM | POA: Diagnosis not present

## 2023-11-28 DIAGNOSIS — D696 Thrombocytopenia, unspecified: Secondary | ICD-10-CM | POA: Diagnosis not present

## 2023-11-28 NOTE — Transitions of Care (Post Inpatient/ED Visit) (Signed)
 11/28/2023  Name: Ryan Bernard MRN: [id number] DOB: 1931/08/20  Today's TOC FU Call Status: Today's TOC FU Call Status:: Successful TOC FU Call Completed TOC FU Call Complete Date: 11/28/23 Patient's Name and Date of Birth confirmed.  Transition Care Management Follow-up Telephone Call Date of Discharge: 11/27/23 Discharge Facility: Other [facility]) Name of Other (Non[facility]) Discharge Facility: [facility] Type of Discharge: Inpatient Admission Primary Inpatient Discharge Diagnosis:: Fall at home and with Epistaxis How have you been since you were released from the hospital?: Better Any questions or concerns?: No  Items Reviewed: Did you receive and understand the discharge instructions provided?: Yes Medications obtained,verified, and reconciled?: Yes (Medications Reviewed) Any new allergies since your discharge?: No Dietary orders reviewed?: Yes Type of Diet Ordered:: Low sodium heart healthy Do you have support at home?: Yes People in Home [RPT]: child(ren), adult Name of Support/Comfort Primary Source: son and daughter help as needed - son lives there  Medications Reviewed Today: Medications Reviewed Today     Reviewed by [name] LABOR, RN (Registered Nurse) on 11/28/23 at 1243  Med List Status: <None>   Medication Order Taking? Sig Documenting Provider Last Dose Status Informant  albuterol  (PROVENTIL  HFA;VENTOLIN  HFA) 108 (90 Base) MCG/ACT inhaler [id number] Yes Inhale 2 puffs into the lungs every 6 (six) hours as needed for wheezing. [name], DO  Active   AMBULATORY NON FORMULARY MEDICATION [id number]  Portable Oxygen  concentrator  Titrate for POC 2 L [place] start  Sat walking on room air 82% sat walking with 2L [place] 97% Dispense: 1 each, Refills: 0 ordered [name], DO  Consider Medication Status and Discontinue            Med Note [name], [name]   Thu Jul 31, 2023  2:03 PM) Patient states he failed the POC test and will not be getting the  POC  AMBULATORY NON FORMULARY MEDICATION [id number]  Please complete POC evaluation. Dx: J96.10, I50.20 [name], DO  Consider Medication Status and Discontinue   AMBULATORY NON FORMULARY MEDICATION [id number]  Please provide bedside toilet. Z91.81, J96.10 [name], DO  Consider Medication Status and Discontinue   apixaban  (ELIQUIS ) 5 MG TABS tablet [id number]  Take 5 mg by mouth 2 (two) times daily.  Patient not taking: Reported on 11/28/2023   [provider]  Active   cyanocobalamin 100 MCG tablet [id number]  Take 100 mcg by mouth daily.  Patient not taking: Reported on 11/28/2023   [provider]  Active   dapagliflozin propanediol (FARXIGA) 10 MG TABS tablet [id number]  Take 1 tablet by mouth daily.  Patient not taking: Reported on 11/28/2023   [provider]  Active   furosemide  (LASIX ) 20 MG tablet [id number]  Take 1 tablet (20 mg total) by mouth daily.  Patient not taking: Reported on 11/28/2023   [name], [name], DO  Active            Med Note [facility], Aviv Lengacher A   Thu Aug 07, 2023  2:18 PM) Per Dr Bring [name] - Patient to take 20mg  daily and increase to 40mg  daily as needed for 5lbs weight gain in 24hours, edema, shortness of breath  ipratropium-albuterol  (DUONEB) 0.5-2.5 (3) MG/3ML SOLN [id number] Yes Take 3 mLs by nebulization every 6 (six) hours as needed. [provider]  Active   metoprolol  succinate (TOPROL -XL) 100 MG 24 hr tablet [id number] Yes Take 100 mg by mouth daily. Take with or immediately following a meal.  Patient taking differently: Take 100  mg by mouth daily. Per [facility] 11/27/23 discharge summary patient is 2x/day   [provider]  Active   pravastatin  (PRAVACHOL ) 80 MG tablet [id number] Yes Take 1 tablet (80 mg total) by mouth at bedtime. [name], DO  Active   sacubitril-valsartan (ENTRESTO) 24-26 MG [id number]  Take 1 tablet by mouth 2 (two) times daily.  Patient not taking: Reported on  11/28/2023   [provider]  Active   spironolactone (ALDACTONE) 25 MG tablet [id number]  Take 1 tablet by mouth daily.  Patient not taking: Reported on 11/28/2023   [provider]  Active   terazosin (HYTRIN) 10 MG capsule [id number]  Take 10 mg by mouth daily.    Patient not taking: Reported on 11/28/2023   [provider]  Active             Home Care and Equipment/Supplies: Were Home Health Services Ordered?: Yes Name of Home Health Agency:: [facility] Has Agency set up a time to come to your home?: Yes First Home Health Visit Date: 11/28/23 Any new equipment or medical supplies ordered?: No  Functional Questionnaire: Do you need assistance with bathing/showering or dressing?: No Do you need assistance with meal preparation?: Yes (son prepares meal) Do you need assistance with eating?: No Do you have difficulty maintaining continence: No Do you need assistance with getting out of bed/getting out of a chair/moving?: No Do you have difficulty managing or taking your medications?: No  Follow up appointments reviewed: PCP Follow-up appointment confirmed?: Yes Date of PCP follow-up appointment?: 12/04/23 Follow-up Provider: PCP, Dr Alvia [facility] Follow-up appointment confirmed?: Yes Date of Specialist follow-up appointment?: 12/01/23 Follow-Up Specialty Provider:: Cardiology, [name] Do you need transportation to your follow-up appointment?: No Do you understand care options if your condition(s) worsen?: Yes-patient verbalized understanding  SDOH Interventions Today    Flowsheet Row Most Recent Value  SDOH Interventions   Food Insecurity Interventions Intervention Not Indicated  Housing Interventions Intervention Not Indicated  Transportation Interventions Intervention Not Indicated  Utilities Interventions Intervention Not Indicated    Goals Addressed             This Visit's Progress    [facility]I Transitions of  Care (TOC) Care Plan       Problems:  Recent Hospitalization for treatment of    Primary Diagnosis: Epistaxis  - fall at home (went to mailbox without oxygen ) Patient goes out without oxygen  at times, gets short of breath and falls - education provided   Goal:  Over the next 30 days, the patient will not experience hospital readmission  Interventions:  Transitions of Care: Doctor Visits  - discussed the importance of doctor visits Discussed Home Health PT/OT who arrived during call - [facility]  Falls Interventions: Reviewed medications and discussed potential side effects of medications such as dizziness and frequent urination Advised patient of importance of notifying provider of falls Assessed for falls since last encounter Assessed patients knowledge of fall risk prevention secondary to previously provided education Provided patient information for fall alert systems - patient has life alert Assessed social determinant of health barriers  Patient Self Care Activities:  Attend all scheduled provider appointments Call pharmacy for medication refills 3-7 days in advance of running out of medications Call provider office for new concerns or questions  Notify RN Care Manager of TOC call rescheduling needs Participate in Transition of Care Program/Attend TOC scheduled calls Take medications as prescribed    Plan:  Telephone follow up appointment  with care management team member scheduled for:  11/25/23 2pm The patient has been provided with contact information for the care management team and has been advised to call with any health related questions or concerns.         Shona Prow RN, CCM   VBCI-Population Health RN Care Manager [phone]

## 2023-11-28 NOTE — Patient Instructions (Signed)
 Visit Information  Thank you for taking time to visit with me today. Please don't hesitate to contact me if I can be of assistance to you before our next scheduled telephone appointment.  Our next appointment is by telephone on 12/05/23 at 2pm  Following is a copy of your care plan:   Goals Addressed             This Visit's Progress    VBCI Transitions of Care (TOC) Care Plan       Problems:  Recent Hospitalization for treatment of    Primary Diagnosis: Epistaxis  - fall at home (went to mailbox without oxygen ) Patient goes out without oxygen  at times, gets short of breath and falls - education provided   Goal:  Over the next 30 days, the patient will not experience hospital readmission  Interventions:  Transitions of Care: Doctor Visits  - discussed the importance of doctor visits Discussed Home Health PT/OT who arrived during call - [facility]  Falls Interventions: Reviewed medications and discussed potential side effects of medications such as dizziness and frequent urination Advised patient of importance of notifying provider of falls Assessed for falls since last encounter Assessed patients knowledge of fall risk prevention secondary to previously provided education Provided patient information for fall alert systems - patient has life alert Assessed social determinant of health barriers  Patient Self Care Activities:  Attend all scheduled provider appointments Call pharmacy for medication refills 3-7 days in advance of running out of medications Call provider office for new concerns or questions  Notify RN Care Manager of TOC call rescheduling needs Participate in Transition of Care Program/Attend TOC scheduled calls Take medications as prescribed    Plan:  Telephone follow up appointment with care management team member scheduled for:  11/25/23 2pm The patient has been provided with contact information for the care management team and has been advised to call with  any health related questions or concerns.         Patient verbalizes understanding of instructions and care plan provided today and agrees to view in MyChart. Active MyChart status and patient understanding of how to access instructions and care plan via MyChart confirmed with patient.     Telephone follow up appointment with care management team member scheduled for: 12/05/23 The patient has been provided with contact information for the care management team and has been advised to call with any health related questions or concerns.   Please call the care guide team at [phone] if you need to cancel or reschedule your appointment.   Please call the Suicide and Crisis Lifeline: 988 call 1-800-273-TALK (toll free, 24 hour hotline) call 911 if you are experiencing a Mental Health or Behavioral Health Crisis or need someone to talk to.  Shona Prow RN, CCM [place]  VBCI-Population Health RN Care Manager [phone]

## 2023-11-30 DIAGNOSIS — I482 Chronic atrial fibrillation, unspecified: Secondary | ICD-10-CM | POA: Diagnosis not present

## 2023-11-30 DIAGNOSIS — Z7409 Other reduced mobility: Secondary | ICD-10-CM | POA: Diagnosis not present

## 2023-12-01 ENCOUNTER — Telehealth: Payer: Self-pay

## 2023-12-01 DIAGNOSIS — I255 Ischemic cardiomyopathy: Secondary | ICD-10-CM | POA: Diagnosis not present

## 2023-12-01 DIAGNOSIS — I482 Chronic atrial fibrillation, unspecified: Secondary | ICD-10-CM | POA: Diagnosis not present

## 2023-12-01 NOTE — Telephone Encounter (Signed)
 Copied from CRM #[id number]. Topic: Clinical - Home Health Verbal Orders >> Dec 01, 2023  9:06 AM Farrel B wrote: Caller/Agency: Clem Sprung PT with [facility] Callback Number: [phone] can leave a message if necessary  Service Requested: Physical Therapy Frequency: 1week 2 and 2 week 1, 1 week 6 Any new concerns about the patient? No

## 2023-12-01 NOTE — Telephone Encounter (Signed)
 Verbal order approval given to Clem Sprung - PT with [facility] home health.

## 2023-12-03 DIAGNOSIS — I5022 Chronic systolic (congestive) heart failure: Secondary | ICD-10-CM | POA: Diagnosis not present

## 2023-12-03 DIAGNOSIS — J9621 Acute and chronic respiratory failure with hypoxia: Secondary | ICD-10-CM | POA: Diagnosis not present

## 2023-12-03 DIAGNOSIS — E119 Type 2 diabetes mellitus without complications: Secondary | ICD-10-CM | POA: Diagnosis not present

## 2023-12-03 DIAGNOSIS — N179 Acute kidney failure, unspecified: Secondary | ICD-10-CM | POA: Diagnosis not present

## 2023-12-03 DIAGNOSIS — I110 Hypertensive heart disease with heart failure: Secondary | ICD-10-CM | POA: Diagnosis not present

## 2023-12-03 DIAGNOSIS — I2510 Atherosclerotic heart disease of native coronary artery without angina pectoris: Secondary | ICD-10-CM | POA: Diagnosis not present

## 2023-12-03 DIAGNOSIS — D696 Thrombocytopenia, unspecified: Secondary | ICD-10-CM | POA: Diagnosis not present

## 2023-12-04 ENCOUNTER — Inpatient Hospital Stay: Admitting: Family Medicine

## 2023-12-05 ENCOUNTER — Other Ambulatory Visit: Payer: Self-pay

## 2023-12-05 ENCOUNTER — Other Ambulatory Visit

## 2023-12-05 NOTE — Patient Instructions (Signed)
 Visit Information  Thank you for taking time to visit with me today. Please don't hesitate to contact me if I can be of assistance to you before our next scheduled telephone appointment.  Our next appointment is by telephone on 12/11/23 at 2pm  Following is a copy of your care plan:   Goals Addressed             This Visit's Progress    VBCI Transitions of Care (TOC) Care Plan       Problems:  Recent Hospitalization for treatment of    Primary Diagnosis: Epistaxis  - fall at home (went to mailbox without oxygen ) Patient goes out without oxygen  at times, gets short of breath and falls - education provided   Goal:  Over the next 30 days, the patient will not experience hospital readmission  Interventions:  Transitions of Care: Doctor Visits  - discussed the importance of doctor visits Discussed Home Health PT/OT who arrived during call - [facility] - states PT has been to home twice per 12/05/23 call 12/05/23: [facility] RN reviewed patient's 12/01/23 cardiology note prior to call - when patient answered, TOC RN could hear patient was short of breath and patient checked pulse and reported 89%. TOC RN advised patient to focus on breathing in through his nose and out through his mouth (patient on oxygen ) and patient also was noted to be wheezing. Patient stated he uses nebulizer but said he'd not used the inhaler. TOC RN encouraged patient to use inhaler and wheezing resolved and oxygen  level improved to 97% per patient. Son states patient has a lift chair but had not used the lift to get out of the chair to answer the phone and had struggled to get up. Patient confirmed he restarted the Entresto, Farxigo, Spironolactone - Toprol  XL daily - discussed with son, Rodgers that MD reviewed with them the risk vs benefit with Eliquis  related patient's falls and risk of bleeding vs risk of stroke and patient/family chose to continue to hold Eliquis  - Cardio note states Afib with HR 110 -today patient reports 134/80  HR   110  BGM 138    Wt - 135lbs Patient states his pulse ox is not displaying the HR so son agreed to check manual pulse and confirmed it is irregular and at 80 BPM. TOC RN explained in detail Atrial Fibrillation and seeking treatment for rapid HR - TOC RN also reviewed ACP and having patient's preferences /wishes documented. Patient and son thanked [facility] RN for info and was advised they can discuss with PCP at his upcoming appt.   Falls Interventions: Reviewed medications and discussed potential side effects of medications such as dizziness and frequent urination Advised patient of importance of notifying provider of falls Assessed for falls since last encounter Assessed patients knowledge of fall risk prevention secondary to previously provided education Provided patient information for fall alert systems - patient has life alert Assessed social determinant of health barriers  Patient Self Care Activities:  Attend all scheduled provider appointments Call pharmacy for medication refills 3-7 days in advance of running out of medications Call provider office for new concerns or questions  Notify RN Care Manager of TOC call rescheduling needs Participate in Transition of Care Program/Attend TOC scheduled calls Take medications as prescribed    Plan:  Telephone follow up appointment with care management team member scheduled for:  12/11/23 2pm The patient has been provided with contact information for the care management team and has been advised to call  with any health related questions or concerns.         Patient verbalizes understanding of instructions and care plan provided today and agrees to view in MyChart. Active MyChart status and patient understanding of how to access instructions and care plan via MyChart confirmed with patient.     Telephone follow up appointment with care management team member scheduled for:12/11/23 The patient has been provided with contact information for the  care management team and has been advised to call with any health related questions or concerns.   Please call the care guide team at [phone] if you need to cancel or reschedule your appointment.   Please call the Suicide and Crisis Lifeline: 988 call 911 if you are experiencing a Mental Health or Behavioral Health Crisis or need someone to talk to.  Shona Prow RN, CCM [place]  VBCI-Population Health RN Care Manager [phone]

## 2023-12-05 NOTE — Transitions of Care (Post Inpatient/ED Visit) (Signed)
 Transition of Care week 2  Visit Note  12/05/2023  Name: Ryan Bernard MRN: [id number]          DOB: 1931/04/09  Situation: Patient enrolled in [facility] 30-day program. Visit completed with patient and son, Ryan Bernard by telephone.   Background:Admission:  9/11 - 9/18  [facility]        Primary Diagnosis: Epistaxis  - fall at home (went to mailbox without oxygen )  Initial Transition Care Management Follow-up Telephone Call    Past Medical History:  Diagnosis Date   Anemia    Arthritis    BPH (benign prostatic hyperplasia)    CAD (coronary artery disease) 03/11/1993   w/ angioplasty   CHF (congestive heart failure) (HCC)    Chronic kidney disease    Chronic obstructive pulmonary disease (HCC) 12/17/2021   Chronic obstructive pulmonary disease (HCC) 12/17/2021   Diabetes mellitus 03/11/2002   type 2   Heart disease    Mild valvular w/ pulm HTN   Heart murmur    Hyperlipidemia    Hypertension    SBE (subacute bacterial endocarditis) prophylaxis candidate    Well adult exam 12/17/2021    Assessment: Patient Reported Symptoms: Cognitive Cognitive Status: No symptoms reported, Normal speech and language skills, Alert and oriented to person, place, and time      Neurological Neurological Review of Symptoms: No symptoms reported    HEENT HEENT Symptoms Reported: Other: (patient states he plans to talk with MD at [facility] about his hearing - feels it may be getting a little worse - states he had eyes checked in June and got new glasses)      Cardiovascular Cardiovascular Symptoms Reported: Other: Other Cardiovascular Symptoms: Patient states pulse ox is not picking up his HR and TOC RN asked son, Ryan Bernard to check and he reports pulse is irregular and 80 beat per minute Cardiovascular Management Strategies: Medication therapy, Diet modification Weight: 135 lb (61.2 kg)  Respiratory Respiratory Symptoms Reported: Wheezing    Endocrine      Gastrointestinal Gastrointestinal Symptoms  Reported: No symptoms reported      Genitourinary Genitourinary Symptoms Reported: No symptoms reported    Integumentary Integumentary Symptoms Reported: Bruising Other Integumentary Symptoms: Son states everything is healing pretty good and states slight brusing around nose - pretty much gone now'    Musculoskeletal Musculoskelatal Symptoms Reviewed: No symptoms reported        Psychosocial Psychosocial Symptoms Reported: No symptoms reported         There were no vitals filed for this visit.  Medications Reviewed Today     Reviewed by [name] LABOR, RN (Registered Nurse) on 12/05/23 at 1402  Med List Status: <None>   Medication Order Taking? Sig Documenting Provider Last Dose Status Informant  albuterol  (PROVENTIL  HFA;VENTOLIN  HFA) 108 (90 Base) MCG/ACT inhaler [id number] Yes Inhale 2 puffs into the lungs every 6 (six) hours as needed for wheezing. [name], DO  Active   AMBULATORY NON FORMULARY MEDICATION [id number]  Portable Oxygen  concentrator  Titrate for POC 2 L [place] start  Sat walking on room air 82% sat walking with 2L [place] 97% Dispense: 1 each, Refills: 0 ordered [name], DO  Consider Medication Status and Discontinue            Med Note [name], Kamerin Grumbine A   Thu Jul 31, 2023  2:03 PM) Patient states he failed the POC test and will not be getting the POC  AMBULATORY NON FORMULARY MEDICATION [id number]  Please complete POC evaluation.  Dx: J96.10, I50.20 [name], DO  Consider Medication Status and Discontinue   AMBULATORY NON FORMULARY MEDICATION [id number]  Please provide bedside toilet. Z91.81, J96.10 [name], DO  Consider Medication Status and Discontinue   apixaban  (ELIQUIS ) 5 MG TABS tablet [id number]  Take 5 mg by mouth 2 (two) times daily.  Patient not taking: Reported on 12/05/2023   [provider]  Active   cyanocobalamin 100 MCG tablet [id number]  Take 100 mcg by mouth daily.  Patient not taking: Reported on 12/05/2023    [provider]  Active   dapagliflozin propanediol (FARXIGA) 10 MG TABS tablet [id number] Yes Take 1 tablet by mouth daily. [provider]  Active   furosemide  (LASIX ) 20 MG tablet [id number] Yes Take 1 tablet (20 mg total) by mouth daily.  Patient taking differently: Take 20 mg by mouth daily as needed for fluid or edema. Son reports patient only takes Lasix  with weight gain 3lbs over night or 5lbs in a week or swelling   [name], DO  Active            Med Note [name], Deno Sida A   Thu Aug 07, 2023  2:18 PM) Per Dr Bring [name] - Patient to take 20mg  daily and increase to 40mg  daily as needed for 5lbs weight gain in 24hours, edema, shortness of breath  ipratropium-albuterol  (DUONEB) 0.5-2.5 (3) MG/3ML SOLN [id number] Yes Take 3 mLs by nebulization every 6 (six) hours as needed. [provider]  Active   metoprolol  succinate (TOPROL -XL) 100 MG 24 hr tablet [id number] Yes Take 100 mg by mouth daily. Take with or immediately following a meal. [provider]  Active   pravastatin  (PRAVACHOL ) 80 MG tablet [id number] Yes Take 1 tablet (80 mg total) by mouth at bedtime. [name], DO  Active   sacubitril-valsartan (ENTRESTO) 24-26 [place] [id number] Yes Take 1 tablet by mouth 2 (two) times daily. [provider]  Active   spironolactone (ALDACTONE) 25 MG tablet [id number] Yes Take 1 tablet by mouth daily. [provider]  Active   terazosin (HYTRIN) 10 MG capsule [id number]  Take 10 mg by mouth daily.    Patient not taking: Reported on 11/28/2023   [provider]  Consider Medication Status and Discontinue (Discontinued by provider)             Recommendation:   Continue Current Plan of Care  Follow Up Plan:   Telephone follow up appointment date/time:  12/11/23 2pm  Shona Prow RN, CCM [place]  VBCI-Population Health RN Care Manager [phone]

## 2023-12-11 ENCOUNTER — Telehealth: Payer: Self-pay

## 2023-12-11 DIAGNOSIS — J9621 Acute and chronic respiratory failure with hypoxia: Secondary | ICD-10-CM | POA: Diagnosis not present

## 2023-12-11 DIAGNOSIS — I5022 Chronic systolic (congestive) heart failure: Secondary | ICD-10-CM | POA: Diagnosis not present

## 2023-12-11 DIAGNOSIS — D696 Thrombocytopenia, unspecified: Secondary | ICD-10-CM | POA: Diagnosis not present

## 2023-12-11 DIAGNOSIS — E119 Type 2 diabetes mellitus without complications: Secondary | ICD-10-CM | POA: Diagnosis not present

## 2023-12-11 DIAGNOSIS — I110 Hypertensive heart disease with heart failure: Secondary | ICD-10-CM | POA: Diagnosis not present

## 2023-12-11 DIAGNOSIS — N179 Acute kidney failure, unspecified: Secondary | ICD-10-CM | POA: Diagnosis not present

## 2023-12-11 DIAGNOSIS — I482 Chronic atrial fibrillation, unspecified: Secondary | ICD-10-CM | POA: Diagnosis not present

## 2023-12-11 DIAGNOSIS — I2510 Atherosclerotic heart disease of native coronary artery without angina pectoris: Secondary | ICD-10-CM | POA: Diagnosis not present

## 2023-12-11 DIAGNOSIS — S022XXD Fracture of nasal bones, subsequent encounter for fracture with routine healing: Secondary | ICD-10-CM | POA: Diagnosis not present

## 2023-12-11 NOTE — Telephone Encounter (Signed)
 Please Advise   Copied from CRM [id number]. Topic: Clinical - Home Health Verbal Orders >> Dec 11, 2023 12:34 PM Emylou G wrote: Caller/Agency: Darice w/[facility] Callback Number: [phone] secured line Service Requested: Occupational Therapy Frequency: eval and 1w6 starting monday Any new concerns about the patient? No

## 2023-12-12 DIAGNOSIS — I2510 Atherosclerotic heart disease of native coronary artery without angina pectoris: Secondary | ICD-10-CM | POA: Diagnosis not present

## 2023-12-15 NOTE — Telephone Encounter (Signed)
 LM for karen that VO were okay to give per Dr. Alvia. Annabella Rigg, CMA

## 2023-12-16 DIAGNOSIS — E119 Type 2 diabetes mellitus without complications: Secondary | ICD-10-CM | POA: Diagnosis not present

## 2023-12-16 DIAGNOSIS — J9621 Acute and chronic respiratory failure with hypoxia: Secondary | ICD-10-CM | POA: Diagnosis not present

## 2023-12-16 DIAGNOSIS — N179 Acute kidney failure, unspecified: Secondary | ICD-10-CM | POA: Diagnosis not present

## 2023-12-16 DIAGNOSIS — I482 Chronic atrial fibrillation, unspecified: Secondary | ICD-10-CM | POA: Diagnosis not present

## 2023-12-16 DIAGNOSIS — I2510 Atherosclerotic heart disease of native coronary artery without angina pectoris: Secondary | ICD-10-CM | POA: Diagnosis not present

## 2023-12-17 DIAGNOSIS — J9621 Acute and chronic respiratory failure with hypoxia: Secondary | ICD-10-CM | POA: Diagnosis not present

## 2023-12-17 DIAGNOSIS — I482 Chronic atrial fibrillation, unspecified: Secondary | ICD-10-CM | POA: Diagnosis not present

## 2023-12-17 DIAGNOSIS — S022XXD Fracture of nasal bones, subsequent encounter for fracture with routine healing: Secondary | ICD-10-CM | POA: Diagnosis not present

## 2023-12-17 DIAGNOSIS — E119 Type 2 diabetes mellitus without complications: Secondary | ICD-10-CM | POA: Diagnosis not present

## 2023-12-17 DIAGNOSIS — D696 Thrombocytopenia, unspecified: Secondary | ICD-10-CM | POA: Diagnosis not present

## 2023-12-17 DIAGNOSIS — I2510 Atherosclerotic heart disease of native coronary artery without angina pectoris: Secondary | ICD-10-CM | POA: Diagnosis not present

## 2023-12-17 DIAGNOSIS — I110 Hypertensive heart disease with heart failure: Secondary | ICD-10-CM | POA: Diagnosis not present

## 2023-12-17 DIAGNOSIS — N179 Acute kidney failure, unspecified: Secondary | ICD-10-CM | POA: Diagnosis not present

## 2023-12-17 DIAGNOSIS — I5022 Chronic systolic (congestive) heart failure: Secondary | ICD-10-CM | POA: Diagnosis not present

## 2023-12-18 ENCOUNTER — Encounter: Payer: Self-pay | Admitting: Family Medicine

## 2023-12-18 ENCOUNTER — Ambulatory Visit: Admitting: Family Medicine

## 2023-12-18 VITALS — BP 114/66 | HR 135 | Ht 67.5 in | Wt 133.0 lb

## 2023-12-18 DIAGNOSIS — D500 Iron deficiency anemia secondary to blood loss (chronic): Secondary | ICD-10-CM

## 2023-12-18 DIAGNOSIS — N1831 Chronic kidney disease, stage 3a: Secondary | ICD-10-CM

## 2023-12-18 DIAGNOSIS — R296 Repeated falls: Secondary | ICD-10-CM | POA: Diagnosis not present

## 2023-12-18 DIAGNOSIS — J9611 Chronic respiratory failure with hypoxia: Secondary | ICD-10-CM

## 2023-12-18 DIAGNOSIS — E1122 Type 2 diabetes mellitus with diabetic chronic kidney disease: Secondary | ICD-10-CM

## 2023-12-18 DIAGNOSIS — R2681 Unsteadiness on feet: Secondary | ICD-10-CM

## 2023-12-18 DIAGNOSIS — E119 Type 2 diabetes mellitus without complications: Secondary | ICD-10-CM

## 2023-12-18 DIAGNOSIS — J690 Pneumonitis due to inhalation of food and vomit: Secondary | ICD-10-CM

## 2023-12-18 DIAGNOSIS — Z23 Encounter for immunization: Secondary | ICD-10-CM

## 2023-12-18 DIAGNOSIS — W19XXXD Unspecified fall, subsequent encounter: Secondary | ICD-10-CM

## 2023-12-18 DIAGNOSIS — I10 Essential (primary) hypertension: Secondary | ICD-10-CM | POA: Diagnosis not present

## 2023-12-18 LAB — POCT GLYCOSYLATED HEMOGLOBIN (HGB A1C): HbA1c, POC (controlled diabetic range): 5.9 % (ref 0.0–7.0)

## 2023-12-18 MED ORDER — AMBULATORY NON FORMULARY MEDICATION: 0 refills | Status: DC

## 2023-12-18 NOTE — Assessment & Plan Note (Signed)
 He is holding Cardura .  Blood pressure is stable with current medications.  Will plan to continue these.

## 2023-12-18 NOTE — Assessment & Plan Note (Addendum)
 Recent fall with nasal fracture and significant epistaxis.  Eliquis  has been held and his cardiologist recommends that he remain off of this.  No further bleeding.  Rechecking hemoglobin.  He does have rollator walker but I think he would benefit from use of transport chair when traveling longer distances.

## 2023-12-18 NOTE — Progress Notes (Addendum)
 Ryan Bernard - 89 y.o. male MRN [id number]  Date of birth: 1931-06-02  Subjective Chief Complaint  Patient presents with   Fall   Diabetes   Hypertension    HPI Ryan Bernard is a 89 year old male here today for follow-up.  Recent hospitalization due to fall.  Reports that he went to his mailbox without his oxygen .  He had fall with possible syncopal episode.  He had significant epistaxis due to fall.  He was on Eliquis  which was discontinued during his hospitalization.  Received tranexamic acid as well as Afrin.  CT of the face showed mildly displaced nasal bone fracture and nasal septal fracture.  ENT consulted and he had anterior nasal packing.  Hemoglobin stable around 10 at time of discharge.  He did have some respiratory distress with concern of aspiration pneumonia.  CT with moderate bilateral pleural effusions.  Treated with IV Zosyn and IV Lasix  with improvement.  He was weaned back to his baseline 2 L O2.  Entresto and Cardura  were held during hospitalization due to soft blood pressures.  He has restarted Entresto but continues to hold Cardura .  ROS:  A comprehensive ROS was completed and negative except as noted per HPI  No Known Allergies  Past Medical History:  Diagnosis Date   Anemia    Arthritis    BPH (benign prostatic hyperplasia)    CAD (coronary artery disease) 03/11/1993   w/ angioplasty   CHF (congestive heart failure) (HCC)    Chronic kidney disease    Chronic obstructive pulmonary disease (HCC) 12/17/2021   Chronic obstructive pulmonary disease (HCC) 12/17/2021   Diabetes mellitus 03/11/2002   type 2   Heart disease    Mild valvular w/ pulm HTN   Heart murmur    Hyperlipidemia    Hypertension    SBE (subacute bacterial endocarditis) prophylaxis candidate    Well adult exam 12/17/2021    Past Surgical History:  Procedure Laterality Date   ANGIOPLASTY  03/11/1993   [facility]   BACK SURGERY  03/11/1962   CORONARY ARTERY BYPASS GRAFT   10/27/2009   5 vessel, Dr. Marty Search   EYE SURGERY     HERNIA REPAIR     x 2     Social History   Socioeconomic History   Marital status: Widowed    Spouse name: Not on file   Number of children: 2   Years of education: 17   Highest education level: 12th grade  Occupational History   Occupation: retired    Comment: Insurance claims handler  Tobacco Use   Smoking status: Never   Smokeless tobacco: Never  Vaping Use   Vaping status: Never Used  Substance and Sexual Activity   Alcohol use: No   Drug use: No   Sexual activity: Not Currently  Other Topics Concern   Not on file  Social History Narrative   Lives with his son. He has two children. Sits around house and watches TV and walks in the yard.   Social Drivers of Corporate investment banker Strain: Low Risk  (09/09/2023)   [facility]   Overall Financial Resource Strain (CARDIA)    Difficulty of Paying Living Expenses: Not hard at all  Food Insecurity: No Food Insecurity (11/28/2023)   Hunger Vital Sign    Worried About Running Out of Food in the Last Year: Never true    Ran Out of Food in the Last Year: Never true  Transportation Needs: No Transportation Needs (11/28/2023)  PRAPARE - Administrator, Civil Service (Medical): No    Lack of Transportation (Non-Medical): No  Physical Activity: Sufficiently Active (05/20/2023)   Exercise Vital Sign    Days of Exercise per Week: 6 days    Minutes of Exercise per Session: 40 min  Stress: No Stress Concern Present (11/20/2023)   Received from [facility] - Occupational Stress Questionnaire    Do you feel stress - tense, restless, nervous, or anxious, or unable to sleep at night because your mind is troubled all the time - these days?: Only a little  Social Connections: Moderately Integrated (09/26/2023)   Social Connection and Isolation Panel    Frequency of Communication with Friends and Family: More than three  times a week    Frequency of Social Gatherings with Friends and Family: More than three times a week    Attends Religious Services: More than 4 times per year    Active Member of [employer] or Organizations: Yes    Attends Banker Meetings: More than 4 times per year    Marital Status: Widowed    Family History  Problem Relation Age of Onset   Heart disease Mother    Hypertension Mother    Alcohol abuse Father    Depression Daughter    Depression Son     Health Maintenance  Topic Date Due   FOOT EXAM  06/18/2023   COVID-19 Vaccine (8 - 2025-26 season) 11/10/2023   Medicare Annual Wellness (AWV)  05/19/2024   HEMOGLOBIN A1C  06/17/2024   OPHTHALMOLOGY EXAM  08/20/2024   DTaP/Tdap/Td (4 - Td or Tdap) 10/09/2025   Pneumococcal Vaccine: 50+ Years  Completed   Influenza Vaccine  Completed   Zoster Vaccines- Shingrix  Completed   Meningococcal B Vaccine  Aged Out     ----------------------------------------------------------------------------------------------------------------------------------------------------------------------------------------------------------------- Physical Exam BP 114/66 (BP Location: Left Arm, Patient Position: Sitting, Cuff Size: Small)   Pulse (!) 135   Ht 5' 7.5 (1.715 m)   Wt 133 lb (60.3 kg)   SpO2 90%   BMI 20.52 kg/m   Physical Exam Constitutional:      Appearance: Normal appearance.  Eyes:     General: No scleral icterus. Cardiovascular:     Rate and Rhythm: Normal rate and regular rhythm.  Pulmonary:     Effort: Pulmonary effort is normal.     Breath sounds: Normal breath sounds.  Musculoskeletal:     Cervical back: Neck supple.  Neurological:     Mental Status: He is alert.  Psychiatric:        Mood and Affect: Mood normal.        Behavior: Behavior normal.      ------------------------------------------------------------------------------------------------------------------------------------------------------------------------------------------------------------------- Assessment and Plan  Essential hypertension, benign He is holding Cardura .  Blood pressure is stable with current medications.  Will plan to continue these.  Fall Recent fall with nasal fracture and significant epistaxis.  Eliquis  has been held and his cardiologist recommends that he remain off of this.  No further bleeding.  Rechecking hemoglobin.  He does have rollator walker but I think he would benefit from use of transport chair when traveling longer distances.  Aspiration pneumonia (HCC) Treated with IV Zosyn during hospitalization.  Symptoms improved and now back to baseline O2 2 L/min.  Type 2 diabetes mellitus with diabetic chronic kidney disease (HCC) Lab Results  Component Value Date   HGBA1C 5.9 12/18/2023   Blood sugars have been well-controlled.  Remains  on Farxiga for coexisting CKD as well.  Chronic respiratory failure (HCC) Remains on oxygen  most of the time.  Recommend continuation.   Meds ordered this encounter  Medications   AMBULATORY NON FORMULARY MEDICATION    Sig: Please provide lightweight medical transport chair.  Dx: R29.6, R26.81    Dispense:  1 Device    Refill:  0    No follow-ups on file.

## 2023-12-18 NOTE — Assessment & Plan Note (Signed)
 Treated with IV Zosyn during hospitalization.  Symptoms improved and now back to baseline O2 2 L/min.

## 2023-12-19 ENCOUNTER — Ambulatory Visit: Payer: Self-pay | Admitting: Family Medicine

## 2023-12-19 LAB — CBC WITH DIFFERENTIAL/PLATELET
Basophils Absolute: 0 x10E3/uL (ref 0.0–0.2)
Basos: 1 %
EOS (ABSOLUTE): 0 x10E3/uL (ref 0.0–0.4)
Eos: 0 %
Hematocrit: 39.1 % (ref 37.5–51.0)
Hemoglobin: 12.2 g/dL — ABNORMAL LOW (ref 13.0–17.7)
Immature Grans (Abs): 0 x10E3/uL (ref 0.0–0.1)
Immature Granulocytes: 0 %
Lymphocytes Absolute: 0.8 x10E3/uL (ref 0.7–3.1)
Lymphs: 13 %
MCH: 31.4 pg (ref 26.6–33.0)
MCHC: 31.2 g/dL — ABNORMAL LOW (ref 31.5–35.7)
MCV: 101 fL — ABNORMAL HIGH (ref 79–97)
Monocytes Absolute: 0.3 x10E3/uL (ref 0.1–0.9)
Monocytes: 5 %
Neutrophils Absolute: 4.9 x10E3/uL (ref 1.4–7.0)
Neutrophils: 81 %
Platelets: 138 x10E3/uL — ABNORMAL LOW (ref 150–450)
RBC: 3.88 x10E6/uL — ABNORMAL LOW (ref 4.14–5.80)
RDW: 13.4 % (ref 11.6–15.4)
WBC: 6.1 x10E3/uL (ref 3.4–10.8)

## 2023-12-19 LAB — CMP14+EGFR
ALT: 13 IU/L (ref 0–44)
AST: 25 IU/L (ref 0–40)
Albumin: 4 g/dL (ref 3.6–4.6)
Alkaline Phosphatase: 91 IU/L (ref 48–129)
BUN/Creatinine Ratio: 18 (ref 10–24)
BUN: 22 mg/dL (ref 10–36)
Bilirubin Total: 1.5 mg/dL — ABNORMAL HIGH (ref 0.0–1.2)
CO2: 28 mmol/L (ref 20–29)
Calcium: 9.5 mg/dL (ref 8.6–10.2)
Chloride: 96 mmol/L (ref 96–106)
Creatinine, Ser: 1.2 mg/dL (ref 0.76–1.27)
Globulin, Total: 3.2 g/dL (ref 1.5–4.5)
Glucose: 225 mg/dL — ABNORMAL HIGH (ref 70–99)
Potassium: 4.3 mmol/L (ref 3.5–5.2)
Sodium: 145 mmol/L — ABNORMAL HIGH (ref 134–144)
Total Protein: 7.2 g/dL (ref 6.0–8.5)
eGFR: 57 mL/min/1.73 — ABNORMAL LOW (ref >59)

## 2023-12-19 LAB — IRON,TIBC AND FERRITIN PANEL
Ferritin: 254 ng/mL (ref 30–400)
Iron Saturation: 42 % (ref 15–55)
Iron: 124 ug/dL (ref 38–169)
Total Iron Binding Capacity: 294 ug/dL (ref 250–450)
UIBC: 170 ug/dL (ref 111–343)

## 2023-12-22 DIAGNOSIS — I350 Nonrheumatic aortic (valve) stenosis: Secondary | ICD-10-CM | POA: Diagnosis not present

## 2023-12-22 DIAGNOSIS — I255 Ischemic cardiomyopathy: Secondary | ICD-10-CM | POA: Diagnosis not present

## 2023-12-22 DIAGNOSIS — I482 Chronic atrial fibrillation, unspecified: Secondary | ICD-10-CM | POA: Diagnosis not present

## 2023-12-24 DIAGNOSIS — D696 Thrombocytopenia, unspecified: Secondary | ICD-10-CM | POA: Diagnosis not present

## 2023-12-24 DIAGNOSIS — I5022 Chronic systolic (congestive) heart failure: Secondary | ICD-10-CM | POA: Diagnosis not present

## 2023-12-24 DIAGNOSIS — J9621 Acute and chronic respiratory failure with hypoxia: Secondary | ICD-10-CM | POA: Diagnosis not present

## 2023-12-24 DIAGNOSIS — I110 Hypertensive heart disease with heart failure: Secondary | ICD-10-CM | POA: Diagnosis not present

## 2023-12-24 DIAGNOSIS — I482 Chronic atrial fibrillation, unspecified: Secondary | ICD-10-CM | POA: Diagnosis not present

## 2023-12-24 DIAGNOSIS — S022XXD Fracture of nasal bones, subsequent encounter for fracture with routine healing: Secondary | ICD-10-CM | POA: Diagnosis not present

## 2023-12-24 DIAGNOSIS — N179 Acute kidney failure, unspecified: Secondary | ICD-10-CM | POA: Diagnosis not present

## 2023-12-24 DIAGNOSIS — I2510 Atherosclerotic heart disease of native coronary artery without angina pectoris: Secondary | ICD-10-CM | POA: Diagnosis not present

## 2023-12-24 DIAGNOSIS — E119 Type 2 diabetes mellitus without complications: Secondary | ICD-10-CM | POA: Diagnosis not present

## 2023-12-25 ENCOUNTER — Telehealth: Payer: Self-pay

## 2023-12-25 NOTE — Telephone Encounter (Signed)
 Copied from CRM [id number]. Topic: Clinical - Order For Equipment >> Dec 24, 2023  3:06 PM Sophia H wrote: Reason for CRM: Spoke with [name] Hamilton (daughter) who is checking on orders for a Light weight transport chair, states orders were put in to [facility] for a wheel chair. Need new orders specifically for a light weight transport chair. Please advise # [phone]

## 2023-12-26 ENCOUNTER — Telehealth: Payer: Self-pay

## 2023-12-26 ENCOUNTER — Ambulatory Visit: Payer: Self-pay

## 2023-12-26 DIAGNOSIS — I255 Ischemic cardiomyopathy: Secondary | ICD-10-CM | POA: Diagnosis not present

## 2023-12-26 DIAGNOSIS — E785 Hyperlipidemia, unspecified: Secondary | ICD-10-CM | POA: Diagnosis not present

## 2023-12-26 DIAGNOSIS — N179 Acute kidney failure, unspecified: Secondary | ICD-10-CM | POA: Diagnosis not present

## 2023-12-26 DIAGNOSIS — S022XXD Fracture of nasal bones, subsequent encounter for fracture with routine healing: Secondary | ICD-10-CM | POA: Diagnosis not present

## 2023-12-26 DIAGNOSIS — N1831 Chronic kidney disease, stage 3a: Secondary | ICD-10-CM | POA: Diagnosis not present

## 2023-12-26 DIAGNOSIS — I482 Chronic atrial fibrillation, unspecified: Secondary | ICD-10-CM | POA: Diagnosis not present

## 2023-12-26 DIAGNOSIS — I1310 Hypertensive heart and chronic kidney disease without heart failure, with stage 1 through stage 4 chronic kidney disease, or unspecified chronic kidney disease: Secondary | ICD-10-CM | POA: Diagnosis not present

## 2023-12-26 DIAGNOSIS — R0602 Shortness of breath: Secondary | ICD-10-CM | POA: Diagnosis not present

## 2023-12-26 DIAGNOSIS — I4819 Other persistent atrial fibrillation: Secondary | ICD-10-CM | POA: Diagnosis not present

## 2023-12-26 DIAGNOSIS — J9611 Chronic respiratory failure with hypoxia: Secondary | ICD-10-CM | POA: Diagnosis not present

## 2023-12-26 DIAGNOSIS — I517 Cardiomegaly: Secondary | ICD-10-CM | POA: Diagnosis not present

## 2023-12-26 DIAGNOSIS — E038 Other specified hypothyroidism: Secondary | ICD-10-CM | POA: Diagnosis not present

## 2023-12-26 DIAGNOSIS — I5043 Acute on chronic combined systolic (congestive) and diastolic (congestive) heart failure: Secondary | ICD-10-CM | POA: Diagnosis not present

## 2023-12-26 DIAGNOSIS — D696 Thrombocytopenia, unspecified: Secondary | ICD-10-CM | POA: Diagnosis not present

## 2023-12-26 DIAGNOSIS — N400 Enlarged prostate without lower urinary tract symptoms: Secondary | ICD-10-CM | POA: Diagnosis not present

## 2023-12-26 DIAGNOSIS — R059 Cough, unspecified: Secondary | ICD-10-CM | POA: Diagnosis not present

## 2023-12-26 DIAGNOSIS — I110 Hypertensive heart disease with heart failure: Secondary | ICD-10-CM | POA: Diagnosis not present

## 2023-12-26 DIAGNOSIS — Z951 Presence of aortocoronary bypass graft: Secondary | ICD-10-CM | POA: Diagnosis not present

## 2023-12-26 DIAGNOSIS — Z1152 Encounter for screening for COVID-19: Secondary | ICD-10-CM | POA: Diagnosis not present

## 2023-12-26 DIAGNOSIS — I5022 Chronic systolic (congestive) heart failure: Secondary | ICD-10-CM | POA: Diagnosis not present

## 2023-12-26 DIAGNOSIS — I25810 Atherosclerosis of coronary artery bypass graft(s) without angina pectoris: Secondary | ICD-10-CM | POA: Diagnosis not present

## 2023-12-26 DIAGNOSIS — I5023 Acute on chronic systolic (congestive) heart failure: Secondary | ICD-10-CM | POA: Diagnosis not present

## 2023-12-26 DIAGNOSIS — I4821 Permanent atrial fibrillation: Secondary | ICD-10-CM | POA: Diagnosis not present

## 2023-12-26 DIAGNOSIS — I739 Peripheral vascular disease, unspecified: Secondary | ICD-10-CM | POA: Diagnosis not present

## 2023-12-26 DIAGNOSIS — Z66 Do not resuscitate: Secondary | ICD-10-CM | POA: Diagnosis not present

## 2023-12-26 DIAGNOSIS — J90 Pleural effusion, not elsewhere classified: Secondary | ICD-10-CM | POA: Diagnosis not present

## 2023-12-26 DIAGNOSIS — R590 Localized enlarged lymph nodes: Secondary | ICD-10-CM | POA: Diagnosis not present

## 2023-12-26 DIAGNOSIS — J9620 Acute and chronic respiratory failure, unspecified whether with hypoxia or hypercapnia: Secondary | ICD-10-CM | POA: Diagnosis not present

## 2023-12-26 DIAGNOSIS — E119 Type 2 diabetes mellitus without complications: Secondary | ICD-10-CM | POA: Diagnosis not present

## 2023-12-26 DIAGNOSIS — J9621 Acute and chronic respiratory failure with hypoxia: Secondary | ICD-10-CM | POA: Diagnosis not present

## 2023-12-26 DIAGNOSIS — I509 Heart failure, unspecified: Secondary | ICD-10-CM | POA: Diagnosis not present

## 2023-12-26 DIAGNOSIS — I2510 Atherosclerotic heart disease of native coronary artery without angina pectoris: Secondary | ICD-10-CM | POA: Diagnosis not present

## 2023-12-26 DIAGNOSIS — J811 Chronic pulmonary edema: Secondary | ICD-10-CM | POA: Diagnosis not present

## 2023-12-26 DIAGNOSIS — I501 Left ventricular failure: Secondary | ICD-10-CM | POA: Diagnosis not present

## 2023-12-26 NOTE — Telephone Encounter (Signed)
 FYI Only or Action Required?: FYI only for provider.  Patient was last seen in primary care on 12/18/2023 by [name], DO.  Called Nurse Triage reporting Cough.  Symptoms began a week ago.  Interventions attempted: Other: oxygen .  Symptoms are: gradually worsening.  Triage Disposition: See HCP Within 4 Hours (Or PCP Triage)  Patient/caregiver understands and will follow disposition?: Yes  RN advised UC. Pts son stated understanding.     Copied from CRM #[id number]. Topic: Clinical - Red Word Triage >> Dec 26, 2023  9:56 AM Delon HERO wrote: Red Word that prompted transfer to Nurse Triage: Patient's son is calling to report that the patient has had congestion for 1 week with cough. At night the oxygen  level is dropping at night 86, then put oxygen  his mouth go to 98, 99. Currently oxygen  is 94. Reason for Disposition  [1] MILD difficulty breathing (e.g., minimal/no SOB at rest, SOB with walking, pulse < 100) AND [2] still present when not coughing  Answer Assessment - Initial Assessment Questions Son called and states that patient wears oxygen  all the time. About 3pm daily, his nasal congestion gets worse and his O2sat drops to 80's and pt has to put his oxygen  in his mouth and it comes up immediately to 98-99%. Son states he has had this congestion for  about a week. Pt states the sputum started out yellow but is now a clear color. He is also having chest tightness at times.    1. ONSET: When did the cough begin?       About a week 2. SEVERITY: How bad is the cough today?      Better today 3. SPUTUM: Describe the color of your sputum (e.g., none, dry cough; clear, white, yellow, green)     More of a clear color now but it was more of yellow color 4. HEMOPTYSIS: Are you coughing up any blood? If Yes, ask: How much? (e.g., flecks, streaks, tablespoons, etc.)     no 5. DIFFICULTY BREATHING: Are you having difficulty breathing? If Yes, ask: How bad is it? (e.g.,  mild, moderate, severe)      Shortness of breath 6. FEVER: Do you have a fever? If Yes, ask: What is your temperature, how was it measured, and when did it start?     no 7. CARDIAC HISTORY: Do you have any history of heart disease? (e.g., heart attack, congestive heart failure)      Bypass surgery- 20 years ago, 5 years ago had to fix a couple of bypases with stents 8. LUNG HISTORY: Do you have any history of lung disease?  (e.g., pulmonary embolus, asthma, emphysema)     unknown 9. PE RISK FACTORS: Do you have a history of blood clots? (or: recent major surgery, recent prolonged travel, bedridden)      10. OTHER SYMPTOMS: Do you have any other symptoms? (e.g., runny nose, wheezing, chest pain)       Wheezing yes, some tightness  Protocols used: Cough - Acute Non-Productive-A-AH

## 2023-12-26 NOTE — Assessment & Plan Note (Addendum)
 Lab Results  Component Value Date   HGBA1C 5.9 12/18/2023   Blood sugars have been well-controlled.  Remains on Farxiga for coexisting CKD as well.

## 2023-12-26 NOTE — Assessment & Plan Note (Signed)
 Remains on oxygen  most of the time.  Recommend continuation.

## 2023-12-30 ENCOUNTER — Telehealth: Payer: Self-pay

## 2023-12-30 DIAGNOSIS — Z7409 Other reduced mobility: Secondary | ICD-10-CM | POA: Diagnosis not present

## 2023-12-30 DIAGNOSIS — I482 Chronic atrial fibrillation, unspecified: Secondary | ICD-10-CM | POA: Diagnosis not present

## 2023-12-30 NOTE — Patient Instructions (Signed)
 Visit Information  Thank you for taking time to visit with me today. Please don't hesitate to contact me if I can be of assistance to you before our next scheduled telephone appointment.  Our next appointment is by telephone on 01/06/24 in the morning after 10am  Following is a copy of your care plan:   Goals Addressed             This Visit's Progress    COMPLETED: VBCI Transitions of Care (TOC) Care Plan       Problems:  Recent Hospitalization for treatment of    Primary Diagnosis: Epistaxis  - fall at home (went to mailbox without oxygen ) Patient goes out without oxygen  at times, gets short of breath and falls - education provided   Goal: No readmission noted-Unable to reach patient for follow up closing toc program  Over the next 30 days, the patient will not experience hospital readmission  Interventions:  Transitions of Care: Doctor Visits  - discussed the importance of doctor visits Discussed Home Health PT/OT who arrived during call - [facility] - states PT has been to home twice per 12/05/23 call 12/05/23: [facility] RN reviewed patient's 12/01/23 cardiology note prior to call - when patient answered, TOC RN could hear patient was short of breath and patient checked pulse and reported 89%. TOC RN advised patient to focus on breathing in through his nose and out through his mouth (patient on oxygen ) and patient also was noted to be wheezing. Patient stated he uses nebulizer but said he'd not used the inhaler. TOC RN encouraged patient to use inhaler and wheezing resolved and oxygen  level improved to 97% per patient. Son states patient has a lift chair but had not used the lift to get out of the chair to answer the phone and had struggled to get up. Patient confirmed he restarted the Entresto, Farxigo, Spironolactone - Toprol  XL daily - discussed with son, Rodgers that MD reviewed with them the risk vs benefit with Eliquis  related patient's falls and risk of bleeding vs risk of stroke and  patient/family chose to continue to hold Eliquis  - Cardio note states Afib with HR 110 -today patient reports 134/80 HR   110  BGM 138    Wt - 135lbs Patient states his pulse ox is not displaying the HR so son agreed to check manual pulse and confirmed it is irregular and at 80 BPM. TOC RN explained in detail Atrial Fibrillation and seeking treatment for rapid HR - TOC RN also reviewed ACP and having patient's preferences /wishes documented. Patient and son thanked [facility] RN for info and was advised they can discuss with PCP at his upcoming appt.   Falls Interventions: Reviewed medications and discussed potential side effects of medications such as dizziness and frequent urination Advised patient of importance of notifying provider of falls Assessed for falls since last encounter Assessed patients knowledge of fall risk prevention secondary to previously provided education Provided patient information for fall alert systems - patient has life alert Assessed social determinant of health barriers  Patient Self Care Activities:  Attend all scheduled provider appointments Call pharmacy for medication refills 3-7 days in advance of running out of medications Call provider office for new concerns or questions  Notify RN Care Manager of TOC call rescheduling needs Participate in Transition of Care Program/Attend TOC scheduled calls Take medications as prescribed    Plan:  Telephone follow up appointment with care management team member scheduled for:  [facility] RN was unable to  reach patient for last scheduled call - reviewed chart and noted need for addition call attempts - called 12/25/23 with no answer - called again 10/17 with no answer - will close toc program  The patient has been provided with contact information for the care management team and has been advised to call with any health related questions or concerns.      VBCI Transitions of Care (TOC) Care Plan       Problems:  Recent Hospitalization  for treatment of stage C heart failure with reduced ejection fraction Admission:  10/17  - 10/20 [facility]   Patient has had multiple hospitalizations  Goal:  Over the next 30 days, the patient will not experience hospital readmission  Interventions:  Transitions of Care: Doctor Visits  - discussed the importance of doctor visits Arranged PCP follow-up within 7 days (Care Guide Scheduled) Contacted Health RN/OT/PT - [facility] and spoke with Clinical manager, Charlena Collum who states they will see patient today 12/30/23 for ROC  Heart Failure Interventions: Provided education on low sodium diet Assessed need for readable accurate scales in home Provided education about placing scale on hard, flat surface Advised patient to weigh each morning after emptying bladder Discussed importance of daily weight and advised patient to weigh and record daily Reviewed role of diuretics in prevention of fluid overload and management of heart failure; Discussed the importance of keeping all appointments with provider Assessed social determinant of health barriers   Patient Self Care Activities:  Attend all scheduled provider appointments Call pharmacy for medication refills 3-7 days in advance of running out of medications Call provider office for new concerns or questions  Notify RN Care Manager of TOC call rescheduling needs Participate in Transition of Care Program/Attend TOC scheduled calls Take medications as prescribed   call office if I gain more than 2 pounds in one day or 5 pounds in one week track weight in diary use salt in moderation watch for swelling in feet, ankles and legs every day weigh myself daily bring diary to all appointments  Plan:  Telephone follow up appointment with care management team member scheduled for:  01/06/24 in the morning after 10am The patient has been provided with contact information for the care management team and has been  advised to call with any health related questions or concerns.         Patient verbalizes understanding of instructions and care plan provided today and agrees to view in MyChart. Active MyChart status and patient understanding of how to access instructions and care plan via MyChart confirmed with patient.     Telephone follow up appointment with care management team member scheduled for: 01/06/24 The patient has been provided with contact information for the care management team and has been advised to call with any health related questions or concerns.   Please call the care guide team at [phone] if you need to cancel or reschedule your appointment.   Please call the Suicide and Crisis Lifeline: 988 call 1-800-273-TALK (toll free, 24 hour hotline) call 911 if you are experiencing a Mental Health or Behavioral Health Crisis or need someone to talk to.  Shona Prow RN, CCM [place]  VBCI-Population Health RN Care Manager [phone]

## 2023-12-30 NOTE — Transitions of Care (Post Inpatient/ED Visit) (Signed)
 12/30/2023  Name: Ryan Bernard MRN: [id number] DOB: 1931-05-11  Today's TOC FU Call Status: Today's TOC FU Call Status:: Successful TOC FU Call Completed TOC FU Call Complete Date: 12/30/23 Patient's Name and Date of Birth confirmed.  Transition Care Management Follow-up Telephone Call Date of Discharge: 12/29/23 Discharge Facility: Other ([facility]) Name of Other ([facility]) Discharge Facility: [facility] Type of Discharge: Inpatient Admission Primary Inpatient Discharge Diagnosis:: shortness of breath, CHF How have you been since you were released from the hospital?: Better Any questions or concerns?: No  Items Reviewed: Did you receive and understand the discharge instructions provided?: Yes Medications obtained,verified, and reconciled?: Yes (Medications Reviewed) Any new allergies since your discharge?: No Dietary orders reviewed?: Yes Type of Diet Ordered:: Cardiac diet (heart healthy) Do you have support at home?: Yes People in Home [RPT]: child(ren), adult Name of Support/Comfort Primary Source: Son, Ryan Bernard &daughter, Ryan Bernard  Medications Reviewed Today: Medications Reviewed Today     Reviewed by Ryan Bernard LABOR, RN (Registered Nurse) on 12/30/23 at 1230  Med List Status: <None>   Medication Order Taking? Sig Documenting Provider Last Dose Status Informant  albuterol  (PROVENTIL  HFA;VENTOLIN  HFA) 108 (90 Base) MCG/ACT inhaler [id number] Yes Inhale 2 puffs into the lungs every 6 (six) hours as needed for wheezing. Ryan Edelman, DO  Active   AMBULATORY NON FORMULARY MEDICATION [id number]  Portable Oxygen  concentrator  Titrate for POC 2 L Ryan Bernard start  Sat walking on room air 82% sat walking with 2L Ryan Bernard 97% Dispense: 1 each, Refills: 0 ordered Ryan Bring, DO  Consider Medication Status and Discontinue (Prescription never filled)            Med Note Ryan Bernard, Ryan Bernard A   Thu Jul 31, 2023  2:03 PM) Patient states he failed the POC test and will not be  getting the POC  AMBULATORY NON FORMULARY MEDICATION [id number]  Please complete POC evaluation. Dx: J96.10, I50.20 Ryan Bring, DO  Consider Medication Status and Discontinue (Prescription never filled)   AMBULATORY NON FORMULARY MEDICATION [id number] Yes Please provide bedside toilet. Z91.81, J96.10 Ryan Bring, DO  Active   Ryan Bernard MEDICATION [id number]  Please provide lightweight medical transport chair.  Dx: R29.6, R26.81  Patient not taking: Reported on 12/30/2023   Ryan Bring, DO  Active   dapagliflozin propanediol (FARXIGA) 10 MG TABS tablet [id number] Yes Take 1 tablet by mouth daily. [provider]  Active   furosemide  (LASIX ) 20 MG tablet [id number] Yes Take 1 tablet (20 mg total) by mouth daily.  Patient taking differently: Take 40 mg by mouth daily. Discharge from 12/29/23 [facility]  reflects 40mg  every day - son states he is taking 20mg  in am and 20mg  8 hours later   Ryan Edelman, DO  Active            Med Note [facility], Ryan Bernard A   Thu Aug 07, 2023  2:18 PM) Per Ryan Bernard Ryan via chat - Patient to take 20mg  daily and increase to 40mg  daily as needed for 5lbs weight gain in 24hours, edema, shortness of breath  ipratropium-albuterol  (DUONEB) 0.5-2.5 (3) MG/3ML SOLN [id number] Yes Take 3 mLs by nebulization every 6 (six) hours as needed. [provider]  Active   metoprolol  succinate (TOPROL -XL) 100 MG 24 hr tablet [id number] Yes Take 100 mg by mouth daily. Take with or immediately following a meal. [provider]  Active   pravastatin  (PRAVACHOL ) 80 MG tablet [id number] Yes Take 1 tablet (80 mg total) by mouth at  bedtime. Ryan Bring, DO  Active   sacubitril-valsartan (ENTRESTO) 24-26 [place] [id number] Yes Take 1 tablet by mouth 2 (two) times daily. [provider]  Active   spironolactone (ALDACTONE) 25 MG tablet [id number] Yes Take 1 tablet by mouth daily. [provider]  Active   terazosin (HYTRIN) 10  MG capsule [id number] Yes Take 10 mg by mouth at bedtime. [provider]  Active             Home Care and Equipment/Supplies: Were Home Health Services Ordered?: Yes Name of Home Health Agency:: [facility] Has Agency set up a time to come to your home?: Yes First Home Health Visit Date: 12/30/23 (Call placed to [facility] to obtain ROC date) Any new equipment or medical supplies ordered?: No  Functional Questionnaire: Do you need assistance with bathing/showering or dressing?: No Do you need assistance with meal preparation?: No (son prepares meals) Do you need assistance with eating?: No Do you have difficulty maintaining continence: No Do you need assistance with getting out of bed/getting out of a chair/moving?: No Do you have difficulty managing or taking your medications?: No  Follow up appointments reviewed: PCP Follow-up appointment confirmed?: Yes Date of PCP follow-up appointment?: 01/05/24 Follow-up Provider: Care guide assist scheduling with PCP, Bernard Ryan Ryan Bernard Follow-up appointment confirmed?: NA (pattient reports Ryan Bernard is calling today to disuss Lasix  dosing and scheduling appointment) Do you need transportation to your follow-up appointment?: No (son drives patient to appointments) Do you understand care options if your condition(s) worsen?: Yes-patient verbalized understanding  SDOH Interventions Today    Flowsheet Row Most Recent Value  SDOH Interventions   Food Insecurity Interventions Intervention Not Indicated  Housing Interventions Intervention Not Indicated  Transportation Interventions Intervention Not Indicated  Utilities Interventions Intervention Not Indicated    Goals Addressed             This Visit's Progress    COMPLETED: VBCI Transitions of Care (TOC) Care Plan       Problems:  Recent Hospitalization for treatment of    Primary Diagnosis: Epistaxis  - fall at home (went to  mailbox without oxygen ) Patient goes out without oxygen  at times, gets short of breath and falls - education provided   Goal: No readmission noted-Unable to reach patient for follow up closing toc program  Over the next 30 days, the patient will not experience hospital readmission  Interventions:  Transitions of Care: Doctor Visits  - discussed the importance of doctor visits Discussed Home Health PT/OT who arrived during call - [facility] - states PT has been to home twice per 12/05/23 call 12/05/23: [facility] RN reviewed patient's 12/01/23 cardiology note prior to call - when patient answered, TOC RN could hear patient was short of breath and patient checked pulse and reported 89%. TOC RN advised patient to focus on breathing in through his nose and out through his mouth (patient on oxygen ) and patient also was noted to be wheezing. Patient stated he uses nebulizer but said he'd not used the inhaler. TOC RN encouraged patient to use inhaler and wheezing resolved and oxygen  level improved to 97% per patient. Son states patient has a lift chair but had not used the lift to get out of the chair to answer the phone and had struggled to get up. Patient confirmed he restarted the Entresto, Farxigo, Spironolactone - Toprol  XL daily - discussed with son, Rodgers that MD reviewed with them the risk vs benefit with Eliquis  related patient's  falls and risk of bleeding vs risk of stroke and patient/family chose to continue to hold Eliquis  - Cardio note states Afib with HR 110 -today patient reports 134/80 HR   110  BGM 138    Wt - 135lbs Patient states his pulse ox is not displaying the HR so son agreed to check manual pulse and confirmed it is irregular and at 80 BPM. TOC RN explained in detail Atrial Fibrillation and seeking treatment for rapid HR - TOC RN also reviewed ACP and having patient's preferences /wishes documented. Patient and son thanked [facility] RN for info and was advised they can discuss with PCP at his upcoming  appt.   Falls Interventions: Reviewed medications and discussed potential side effects of medications such as dizziness and frequent urination Advised patient of importance of notifying provider of falls Assessed for falls since last encounter Assessed patients knowledge of fall risk prevention secondary to previously provided education Provided patient information for fall alert systems - patient has life alert Assessed social determinant of health barriers  Patient Self Care Activities:  Attend all scheduled provider appointments Call pharmacy for medication refills 3-7 days in advance of running out of medications Call provider office for new concerns or questions  Notify RN Care Manager of TOC call rescheduling needs Participate in Transition of Care Program/Attend TOC scheduled calls Take medications as prescribed    Plan:  Telephone follow up appointment with care management team member scheduled for:  [facility] RN was unable to reach patient for last scheduled call - reviewed chart and noted need for addition call attempts - called 12/25/23 with no answer - called again 10/17 with no answer - will close toc program  The patient has been provided with contact information for the care management team and has been advised to call with any health related questions or concerns.      VBCI Transitions of Care (TOC) Care Plan       Problems:  Recent Hospitalization for treatment of stage C heart failure with reduced ejection fraction Admission:  10/17  - 10/20 [facility]   Patient has had multiple hospitalizations  Goal:  Over the next 30 days, the patient will not experience hospital readmission  Interventions:  Transitions of Care: Doctor Visits  - discussed the importance of doctor visits Arranged PCP follow-up within 7 days (Care Guide Scheduled) Contacted Health RN/OT/PT - [facility] and spoke with Clinical manager, Charlena Collum who states they will see  patient today 12/30/23 for ROC  Heart Failure Interventions: Provided education on low sodium diet Assessed need for readable accurate scales in home Provided education about placing scale on hard, flat surface Advised patient to weigh each morning after emptying bladder Discussed importance of daily weight and advised patient to weigh and record daily Reviewed role of diuretics in prevention of fluid overload and management of heart failure; Discussed the importance of keeping all appointments with provider Assessed social determinant of health barriers   Patient Self Care Activities:  Attend all scheduled provider appointments Call pharmacy for medication refills 3-7 days in advance of running out of medications Call provider office for new concerns or questions  Notify RN Care Manager of TOC call rescheduling needs Participate in Transition of Care Program/Attend TOC scheduled calls Take medications as prescribed   call office if I gain more than 2 pounds in one day or 5 pounds in one week track weight in diary use salt in moderation watch for swelling in feet, ankles  and legs every day weigh myself daily Bernard diary to all appointments  Plan:  Telephone follow up appointment with care management team member scheduled for:  01/06/24 in the morning after 10am The patient has been provided with contact information for the care management team and has been advised to call with any health related questions or concerns.         Bernard Prow RN, CCM [place]  VBCI-Population Health RN Care Manager [phone]

## 2023-12-31 DIAGNOSIS — S022XXD Fracture of nasal bones, subsequent encounter for fracture with routine healing: Secondary | ICD-10-CM | POA: Diagnosis not present

## 2023-12-31 DIAGNOSIS — J9621 Acute and chronic respiratory failure with hypoxia: Secondary | ICD-10-CM | POA: Diagnosis not present

## 2023-12-31 DIAGNOSIS — D696 Thrombocytopenia, unspecified: Secondary | ICD-10-CM | POA: Diagnosis not present

## 2023-12-31 DIAGNOSIS — I2510 Atherosclerotic heart disease of native coronary artery without angina pectoris: Secondary | ICD-10-CM | POA: Diagnosis not present

## 2023-12-31 DIAGNOSIS — N179 Acute kidney failure, unspecified: Secondary | ICD-10-CM | POA: Diagnosis not present

## 2023-12-31 DIAGNOSIS — I482 Chronic atrial fibrillation, unspecified: Secondary | ICD-10-CM | POA: Diagnosis not present

## 2023-12-31 DIAGNOSIS — I5022 Chronic systolic (congestive) heart failure: Secondary | ICD-10-CM | POA: Diagnosis not present

## 2023-12-31 DIAGNOSIS — E119 Type 2 diabetes mellitus without complications: Secondary | ICD-10-CM | POA: Diagnosis not present

## 2023-12-31 DIAGNOSIS — I110 Hypertensive heart disease with heart failure: Secondary | ICD-10-CM | POA: Diagnosis not present

## 2024-01-02 DIAGNOSIS — I350 Nonrheumatic aortic (valve) stenosis: Secondary | ICD-10-CM | POA: Diagnosis not present

## 2024-01-02 DIAGNOSIS — I5020 Unspecified systolic (congestive) heart failure: Secondary | ICD-10-CM | POA: Diagnosis not present

## 2024-01-02 DIAGNOSIS — I482 Chronic atrial fibrillation, unspecified: Secondary | ICD-10-CM | POA: Diagnosis not present

## 2024-01-05 ENCOUNTER — Inpatient Hospital Stay: Admitting: Family Medicine

## 2024-01-05 ENCOUNTER — Encounter: Payer: Self-pay | Admitting: Family Medicine

## 2024-01-05 ENCOUNTER — Ambulatory Visit (INDEPENDENT_AMBULATORY_CARE_PROVIDER_SITE_OTHER): Admitting: Family Medicine

## 2024-01-05 VITALS — BP 117/49 | HR 81 | Ht 67.5 in | Wt 134.0 lb

## 2024-01-05 DIAGNOSIS — I10 Essential (primary) hypertension: Secondary | ICD-10-CM | POA: Diagnosis not present

## 2024-01-05 DIAGNOSIS — N183 Chronic kidney disease, stage 3 unspecified: Secondary | ICD-10-CM | POA: Diagnosis not present

## 2024-01-05 DIAGNOSIS — I5020 Unspecified systolic (congestive) heart failure: Secondary | ICD-10-CM

## 2024-01-05 NOTE — Patient Instructions (Addendum)
 Increase furosemide  to 40mg  in the morning and 40mg  in the afternoon for the next 3 days.    Keep sodium intake below 2000mg /day.   See me again in 10-14 days

## 2024-01-05 NOTE — Assessment & Plan Note (Signed)
 Recent hospitalization.  He is followed by cardiology as well.  Weight is up a little bit over the past few days.  We discussed low-sodium diet.  Increase furosemide  to 40 mg twice a day over the next 3 days and then return to 40 mg daily.

## 2024-01-05 NOTE — Progress Notes (Signed)
 Ryan Bernard - 89 y.o. male MRN [id number]  Date of birth: 09-Dec-1931  Subjective Chief Complaint  Patient presents with   Hospitalization Follow-up    HPI Ryan Bernard is a 89 y.o. male here today for HFU.   He was admitted for acute on chronic CHF.  Diuresed in the hospital.  Lasix  40 mg daily added.  He has had follow-up with cardiology last week since being discharged from the hospital.  He does have slightly higher O2 requirement at 4 L and weight is up a few pounds.  No increased edema.  Previous EF of 30 to 35%.  Also with history of chronic A-fib.  Recent labs at cardiology office are stable.  He has not had any chest pain or increasing dyspnea since discharge.  ROS:  A comprehensive ROS was completed and negative except as noted per HPI   No Known Allergies  Past Medical History:  Diagnosis Date   Anemia    Arthritis    BPH (benign prostatic hyperplasia)    CAD (coronary artery disease) 03/11/1993   w/ angioplasty   CHF (congestive heart failure) (HCC)    Chronic kidney disease    Chronic obstructive pulmonary disease (HCC) 12/17/2021   Chronic obstructive pulmonary disease (HCC) 12/17/2021   Diabetes mellitus 03/11/2002   type 2   Heart disease    Mild valvular w/ pulm HTN   Heart murmur    Hyperlipidemia    Hypertension    SBE (subacute bacterial endocarditis) prophylaxis candidate    Well adult exam 12/17/2021    Past Surgical History:  Procedure Laterality Date   ANGIOPLASTY  03/11/1993   [facility]   BACK SURGERY  03/11/1962   CORONARY ARTERY BYPASS GRAFT  10/27/2009   5 vessel, Dr. Marty Search   EYE SURGERY     HERNIA REPAIR     x 2     Social History   Socioeconomic History   Marital status: Widowed    Spouse name: Not on file   Number of children: 2   Years of education: 64   Highest education level: 12th grade  Occupational History   Occupation: retired    Comment: insurance claims handler  Tobacco Use   Smoking status: Never   Smokeless  tobacco: Never  Vaping Use   Vaping status: Never Used  Substance and Sexual Activity   Alcohol use: No   Drug use: No   Sexual activity: Not Currently  Other Topics Concern   Not on file  Social History Narrative   Lives with his son. He has two children. Sits around house and watches TV and walks in the yard.   Social Drivers of Corporate Investment Banker Strain: Low Risk  (09/09/2023)   Received from [facility]   Overall Financial Resource Strain (CARDIA)    Difficulty of Paying Living Expenses: Not hard at all  Food Insecurity: No Food Insecurity (12/30/2023)   Hunger Vital Sign    Worried About Running Out of Food in the Last Year: Never true    Ran Out of Food in the Last Year: Never true  Transportation Needs: No Transportation Needs (12/30/2023)   PRAPARE - Administrator, Civil Service (Medical): No    Lack of Transportation (Non-Medical): No  Physical Activity: Sufficiently Active (05/20/2023)   Exercise Vital Sign    Days of Exercise per Week: 6 days    Minutes of Exercise per Session: 40 min  Stress: No Stress Concern Present (12/26/2023)  Received from [facility] - Occupational Stress Questionnaire    Do you feel stress - tense, restless, nervous, or anxious, or unable to sleep at night because your mind is troubled all the time - these days?: Only a little  Social Connections: Moderately Integrated (09/26/2023)   Social Connection and Isolation Panel    Frequency of Communication with Friends and Family: More than three times a week    Frequency of Social Gatherings with Friends and Family: More than three times a week    Attends Religious Services: More than 4 times per year    Active Member of [employer] or Organizations: Yes    Attends Banker Meetings: More than 4 times per year    Marital Status: Widowed    Family History  Problem Relation Age of Onset   Heart disease Mother     Hypertension Mother    Alcohol abuse Father    Depression Daughter    Depression Son     Health Maintenance  Topic Date Due   COVID-19 Vaccine (8 - 2025-26 season) 01/20/2025 (Originally 11/10/2023)   Medicare Annual Wellness (AWV)  05/19/2024   HEMOGLOBIN A1C  06/17/2024   OPHTHALMOLOGY EXAM  08/20/2024   FOOT EXAM  01/04/2025   DTaP/Tdap/Td (4 - Td or Tdap) 10/09/2025   Pneumococcal Vaccine: 50+ Years  Completed   Influenza Vaccine  Completed   Zoster Vaccines- Shingrix  Completed   Meningococcal B Vaccine  Aged Out     ----------------------------------------------------------------------------------------------------------------------------------------------------------------------------------------------------------------- Physical Exam BP (!) 117/49 (BP Location: Left Arm, Patient Position: Sitting, Cuff Size: Small)   Pulse 81   Ht 5' 7.5 (1.715 m)   Wt 134 lb (60.8 kg)   SpO2 95%   BMI 20.68 kg/m   Physical Exam Constitutional:      Appearance: Normal appearance.  Eyes:     General: No scleral icterus. Cardiovascular:     Rate and Rhythm: Rhythm irregular.  Pulmonary:     Effort: Pulmonary effort is normal.     Breath sounds: Normal breath sounds.  Musculoskeletal:     Cervical back: Neck supple.  Neurological:     General: No focal deficit present.     Mental Status: He is alert.  Psychiatric:        Mood and Affect: Mood normal.        Behavior: Behavior normal.     ------------------------------------------------------------------------------------------------------------------------------------------------------------------------------------------------------------------- Assessment and Plan  Essential hypertension, benign  Blood pressure is stable with current medications.  Will plan to continue these.  ACC/AHA stage C heart failure with reduced ejection fraction (HCC) Recent hospitalization.  He is followed by cardiology as well.  Weight is up a  little bit over the past few days.  We discussed low-sodium diet.  Increase furosemide  to 40 mg twice a day over the next 3 days and then return to 40 mg daily.    Chronic kidney disease, stage 3 (HCC) Recent labs reviewed.  Renal function is stable.   No orders of the defined types were placed in this encounter.   Return in about 2 weeks (around 01/19/2024) for CHF.

## 2024-01-05 NOTE — Assessment & Plan Note (Signed)
 Recent labs reviewed.  Renal function is stable.

## 2024-01-05 NOTE — Assessment & Plan Note (Signed)
 Blood pressure is stable with current medications.  Will plan to continue these.

## 2024-01-06 ENCOUNTER — Telehealth: Payer: Self-pay

## 2024-01-06 NOTE — Telephone Encounter (Signed)
 Copied from CRM [id number]. Topic: Clinical - Order For Equipment >> Dec 24, 2023  3:06 PM Sophia H wrote: Reason for CRM: Spoke with [name] Hamilton (daughter) who is checking on orders for a Light weight transport chair, states orders were put in to [facility] for a wheel chair. Need new orders specifically for a light weight transport chair. Please advise # [phone] >> Jan 06, 2024  2:19 PM Emylou G wrote: [facility] rcvd order for chair but they don't have this ( they rcvd this was last week thru [facility] ) Pls call them if you have questions [phone]

## 2024-01-06 NOTE — Telephone Encounter (Signed)
Anything I can do to help with this?

## 2024-01-07 ENCOUNTER — Telehealth: Payer: Self-pay

## 2024-01-07 DIAGNOSIS — I482 Chronic atrial fibrillation, unspecified: Secondary | ICD-10-CM | POA: Diagnosis not present

## 2024-01-07 DIAGNOSIS — I350 Nonrheumatic aortic (valve) stenosis: Secondary | ICD-10-CM | POA: Diagnosis not present

## 2024-01-07 DIAGNOSIS — I5020 Unspecified systolic (congestive) heart failure: Secondary | ICD-10-CM | POA: Diagnosis not present

## 2024-01-07 NOTE — Patient Instructions (Signed)
 Visit Information  Thank you for taking time to visit with me today. Please don't hesitate to contact me if I can be of assistance to you before our next scheduled telephone appointment.  Our next appointment is by telephone on 01/15/24 in the morning   Following is a copy of your care plan:   Goals Addressed             This Visit's Progress    VBCI Transitions of Care (TOC) Care Plan       Problems:  Recent Hospitalization for treatment of stage C heart failure with reduced ejection fraction Admission:  10/17  - 10/20 [facility]   Patient has had multiple hospitalizations  Goal:  Over the next 30 days, the patient will not experience hospital readmission  Interventions:  Transitions of Care: Doctor Visits  - discussed the importance of doctor visits Arranged PCP follow-up within 7 days (Care Guide Scheduled) Contacted Health RN/OT/PT - [facility] and spoke with Clinical manager, Charlena Collum who states they will see patient today 12/30/23 for ROC Update 01/07/24: Patient states he had cardio appt Friday 01/02/24 with [name] and appt for today 01/07/24 for echocardiogram - Patient states she saw Dr Quitman PCP and said MD feels he is doing well. Recorded weight was 134lb in office and patient reports weight today was 132lbs Record reflects and patient confirms Increase furosemide  to 40 mg twice a day over the next 3 days and then return to 40 mg daily.  Per PCP appt - patient elected not to restart Eliquis /patient confirms. TOC RN felt patient sounded a little short of breath and patient stated it was because he recently finished therapy exercises and was talking a lot. TOC RN asked patient to check his O2 level and patient reports O2 saturation on 4liters 91% - TOC RN asked patient to focus on breathing through his nose and patient did this for one minute and then reported O2 sat 93 - 95% HR 67 -73bpm. Patient was encouraged to remember to focus on  breathing through his nose and pause as needed when he is talking to keep from getting short of breath - patient reports his sugar today was 141 and states he had a treat before bed last night - patient states they discussed transport chair with PCP at appt and MD is putting in a new order. Patient feels things are going well and agreed to going TOC follow up - Patient understands he can call with any questions/concerns prior to next call.   Heart Failure Interventions: Provided education on low sodium diet Assessed need for readable accurate scales in home Provided education about placing scale on hard, flat surface Advised patient to weigh each morning after emptying bladder Discussed importance of daily weight and advised patient to weigh and record daily Reviewed role of diuretics in prevention of fluid overload and management of heart failure; Discussed the importance of keeping all appointments with provider Assessed social determinant of health barriers   Patient Self Care Activities:  Attend all scheduled provider appointments Call pharmacy for medication refills 3-7 days in advance of running out of medications Call provider office for new concerns or questions  Notify RN Care Manager of TOC call rescheduling needs Participate in Transition of Care Program/Attend TOC scheduled calls Take medications as prescribed   call office if I gain more than 2 pounds in one day or 5 pounds in one week track weight in diary use salt in moderation watch  for swelling in feet, ankles and legs every day weigh myself daily bring diary to all appointments  Plan:  Telephone follow up appointment with care management team member scheduled for:  01/15/24 in the morning after 10am The patient has been provided with contact information for the care management team and has been advised to call with any health related questions or concerns.         Patient verbalizes understanding of instructions and  care plan provided today and agrees to view in MyChart. Active MyChart status and patient understanding of how to access instructions and care plan via MyChart confirmed with patient.     Telephone follow up appointment with care management team member scheduled for: 01/15/24 The patient has been provided with contact information for the care management team and has been advised to call with any health related questions or concerns.   Please call the care guide team at [phone] if you need to cancel or reschedule your appointment.   Please call the Suicide and Crisis Lifeline: 988 call 1-800-273-TALK (toll free, 24 hour hotline) call 911 if you are experiencing a Mental Health or Behavioral Health Crisis or need someone to talk to.  Shona Prow RN, CCM [place]  VBCI-Population Health RN Care Manager [phone]

## 2024-01-07 NOTE — Transitions of Care (Post Inpatient/ED Visit) (Signed)
 Transition of Care week 2  Visit Note  01/07/2024  Name: Ryan Bernard MRN: [id number]          DOB: 1931-07-20  Situation: Patient enrolled in [facility] 30-day program. Visit completed with patient by telephone.   Background: Admission:  10/17  - 10/20 [facility]   Primary Diagnosis: stage C heart failure with reduced ejection fraction   Initial Transition Care Management Follow-up Telephone Call Discharge Date and Diagnosis: 12/29/23, shortness of breath, CHF   Past Medical History:  Diagnosis Date   Anemia    Arthritis    BPH (benign prostatic hyperplasia)    CAD (coronary artery disease) 03/11/1993   w/ angioplasty   CHF (congestive heart failure) (HCC)    Chronic kidney disease    Chronic obstructive pulmonary disease (HCC) 12/17/2021   Chronic obstructive pulmonary disease (HCC) 12/17/2021   Diabetes mellitus 03/11/2002   type 2   Heart disease    Mild valvular w/ pulm HTN   Heart murmur    Hyperlipidemia    Hypertension    SBE (subacute bacterial endocarditis) prophylaxis candidate    Well adult exam 12/17/2021    Assessment: Patient Reported Symptoms: Cognitive Cognitive Status: No symptoms reported, Normal speech and language skills, Alert and oriented to person, place, and time      Neurological Neurological Review of Symptoms: No symptoms reported Oher Neurological Symptoms/Conditions [RPT]: Patient states in the past the vertigo had been bad but is okay now    HEENT HEENT Symptoms Reported: No symptoms reported      Cardiovascular Cardiovascular Symptoms Reported: No symptoms reported (Patient reports his PCP told him to increase lasix  for 30 days and patient states his son ordered compression socks as directed by PCP - patient states swelling has already improved) Cardiovascular Management Strategies: Medication therapy Weight: 132 lb (59.9 kg)  Respiratory Respiratory Symptoms Reported: Shortness of breath Other Respiratory Symptoms: Patient  reports O2 saturation on 4liters at time of call 93 - 95% HR 67 -73bpm Respiratory Management Strategies: Oxygen  therapy  Endocrine Endocrine Symptoms Reported: No symptoms reported Is patient diabetic?: Yes Is patient checking blood sugars at home?: Yes List most recent blood sugar readings, include date and time of day: patient reports his sugar today was 141 and states he had cookies last night Endocrine Self-Management Outcome: 4 (good)  Gastrointestinal Gastrointestinal Symptoms Reported: No symptoms reported      Genitourinary Genitourinary Symptoms Reported: Frequency Additional Genitourinary Details: patient states the lasix  is working and causes frequency - dose of lasix  was increased for 3 days then return to usual dose Genitourinary Management Strategies: Medication therapy  Integumentary Integumentary Symptoms Reported: No symptoms reported    Musculoskeletal Musculoskelatal Symptoms Reviewed: No symptoms reported Additional Musculoskeletal Details: patient states he uses cane or walker outside and states he is doing the therapy instructed exercises 2x/day        Psychosocial Psychosocial Symptoms Reported: No symptoms reported         There were no vitals filed for this visit.  Medications Reviewed Today     Reviewed by [name] LABOR, RN (Registered Nurse) on 01/07/24 at 1301  Med List Status: <None>   Medication Order Taking? Sig Documenting Provider Last Dose Status Informant  albuterol  (PROVENTIL  HFA;VENTOLIN  HFA) 108 (90 Base) MCG/ACT inhaler [id number] Yes Inhale 2 puffs into the lungs every 6 (six) hours as needed for wheezing. [name], [name], DO  Active   AMBULATORY NON [facility] MEDICATION [id number]  Portable Oxygen  concentrator  Titrate for POC  2 L [place] start  Sat walking on room air 82% sat walking with 2L [place] 97% Dispense: 1 each, Refills: 0 ordered  Patient not taking: Reported on 01/07/2024   [name], DO  Active            Med Note [facility],  Solana Coggin A   Thu Jul 31, 2023  2:03 PM) Patient states he failed the POC test and will not be getting the POC  AMBULATORY NON FORMULARY MEDICATION [id number]  Please complete POC evaluation. Dx: J96.10, I50.20  Patient not taking: Reported on 01/07/2024   [name], DO  Active   AMBULATORY NON [facility] MEDICATION [id number] Yes Please provide bedside toilet. Z91.81, J96.10 [name], DO  Active   [name] MEDICATION [id number]  Please provide lightweight medical transport chair.  Dx: R29.6, R26.81  Patient not taking: Reported on 01/07/2024   [name], DO  Active   dapagliflozin propanediol (FARXIGA) 10 MG TABS tablet [id number] Yes Take 1 tablet by mouth daily. [provider]  Active   furosemide  (LASIX ) 20 MG tablet [id number] Yes Take 1 tablet (20 mg total) by mouth daily. [name], [name], DO  Active            Med Note [facility], Lindon Kiel A   Thu Aug 07, 2023  2:18 PM) Per Dr Bring [name] - Patient to take 20mg  daily and increase to 40mg  daily as needed for 5lbs weight gain in 24hours, edema, shortness of breath  ipratropium-albuterol  (DUONEB) 0.5-2.5 (3) MG/3ML SOLN [id number] Yes Take 3 mLs by nebulization every 6 (six) hours as needed. [provider]  Active   metoprolol  succinate (TOPROL -XL) 100 MG 24 hr tablet [id number] Yes Take 100 mg by mouth daily. Take with or immediately following a meal. [provider]  Active   pravastatin  (PRAVACHOL ) 80 MG tablet [id number] Yes Take 1 tablet (80 mg total) by mouth at bedtime. [name], DO  Active   sacubitril-valsartan (ENTRESTO) 24-26 [place] [id number] Yes Take 1 tablet by mouth 2 (two) times daily. [provider]  Active   spironolactone (ALDACTONE) 25 MG tablet [id number] Yes Take 1 tablet by mouth daily. [provider]  Active   terazosin (HYTRIN) 10 MG capsule [id number] Yes Take 10 mg by mouth at bedtime. [provider]  Active              Recommendation:   Continue Current Plan of Care  Follow Up Plan:   Telephone follow up appointment date/time:  01/15/24 in the morning  Shona Prow RN, CCM [place]  VBCI-Population Health RN Care Manager [phone]

## 2024-01-08 DIAGNOSIS — N179 Acute kidney failure, unspecified: Secondary | ICD-10-CM | POA: Diagnosis not present

## 2024-01-08 DIAGNOSIS — I5022 Chronic systolic (congestive) heart failure: Secondary | ICD-10-CM | POA: Diagnosis not present

## 2024-01-08 DIAGNOSIS — S022XXD Fracture of nasal bones, subsequent encounter for fracture with routine healing: Secondary | ICD-10-CM | POA: Diagnosis not present

## 2024-01-08 DIAGNOSIS — E119 Type 2 diabetes mellitus without complications: Secondary | ICD-10-CM | POA: Diagnosis not present

## 2024-01-08 DIAGNOSIS — I482 Chronic atrial fibrillation, unspecified: Secondary | ICD-10-CM | POA: Diagnosis not present

## 2024-01-08 DIAGNOSIS — I110 Hypertensive heart disease with heart failure: Secondary | ICD-10-CM | POA: Diagnosis not present

## 2024-01-08 DIAGNOSIS — J9621 Acute and chronic respiratory failure with hypoxia: Secondary | ICD-10-CM | POA: Diagnosis not present

## 2024-01-08 DIAGNOSIS — D696 Thrombocytopenia, unspecified: Secondary | ICD-10-CM | POA: Diagnosis not present

## 2024-01-08 DIAGNOSIS — I2510 Atherosclerotic heart disease of native coronary artery without angina pectoris: Secondary | ICD-10-CM | POA: Diagnosis not present

## 2024-01-13 DIAGNOSIS — E785 Hyperlipidemia, unspecified: Secondary | ICD-10-CM | POA: Diagnosis not present

## 2024-01-13 DIAGNOSIS — E1151 Type 2 diabetes mellitus with diabetic peripheral angiopathy without gangrene: Secondary | ICD-10-CM | POA: Diagnosis not present

## 2024-01-13 DIAGNOSIS — J9611 Chronic respiratory failure with hypoxia: Secondary | ICD-10-CM | POA: Diagnosis not present

## 2024-01-13 DIAGNOSIS — I130 Hypertensive heart and chronic kidney disease with heart failure and stage 1 through stage 4 chronic kidney disease, or unspecified chronic kidney disease: Secondary | ICD-10-CM | POA: Diagnosis not present

## 2024-01-13 DIAGNOSIS — E1122 Type 2 diabetes mellitus with diabetic chronic kidney disease: Secondary | ICD-10-CM | POA: Diagnosis not present

## 2024-01-13 DIAGNOSIS — I509 Heart failure, unspecified: Secondary | ICD-10-CM | POA: Diagnosis not present

## 2024-01-13 DIAGNOSIS — J449 Chronic obstructive pulmonary disease, unspecified: Secondary | ICD-10-CM | POA: Diagnosis not present

## 2024-01-13 DIAGNOSIS — I4891 Unspecified atrial fibrillation: Secondary | ICD-10-CM | POA: Diagnosis not present

## 2024-01-13 DIAGNOSIS — N1832 Chronic kidney disease, stage 3b: Secondary | ICD-10-CM | POA: Diagnosis not present

## 2024-01-14 DIAGNOSIS — J9621 Acute and chronic respiratory failure with hypoxia: Secondary | ICD-10-CM | POA: Diagnosis not present

## 2024-01-14 DIAGNOSIS — I2510 Atherosclerotic heart disease of native coronary artery without angina pectoris: Secondary | ICD-10-CM | POA: Diagnosis not present

## 2024-01-14 DIAGNOSIS — E119 Type 2 diabetes mellitus without complications: Secondary | ICD-10-CM | POA: Diagnosis not present

## 2024-01-14 DIAGNOSIS — D696 Thrombocytopenia, unspecified: Secondary | ICD-10-CM | POA: Diagnosis not present

## 2024-01-14 DIAGNOSIS — I482 Chronic atrial fibrillation, unspecified: Secondary | ICD-10-CM | POA: Diagnosis not present

## 2024-01-14 DIAGNOSIS — S022XXD Fracture of nasal bones, subsequent encounter for fracture with routine healing: Secondary | ICD-10-CM | POA: Diagnosis not present

## 2024-01-14 DIAGNOSIS — N179 Acute kidney failure, unspecified: Secondary | ICD-10-CM | POA: Diagnosis not present

## 2024-01-14 DIAGNOSIS — I5022 Chronic systolic (congestive) heart failure: Secondary | ICD-10-CM | POA: Diagnosis not present

## 2024-01-14 DIAGNOSIS — I110 Hypertensive heart disease with heart failure: Secondary | ICD-10-CM | POA: Diagnosis not present

## 2024-01-19 ENCOUNTER — Telehealth: Payer: Self-pay

## 2024-01-19 ENCOUNTER — Encounter: Payer: Self-pay | Admitting: Family Medicine

## 2024-01-19 ENCOUNTER — Ambulatory Visit (INDEPENDENT_AMBULATORY_CARE_PROVIDER_SITE_OTHER): Admitting: Family Medicine

## 2024-01-19 VITALS — BP 113/62 | HR 100 | Ht 67.5 in | Wt 132.0 lb

## 2024-01-19 DIAGNOSIS — I10 Essential (primary) hypertension: Secondary | ICD-10-CM | POA: Diagnosis not present

## 2024-01-19 DIAGNOSIS — Z7984 Long term (current) use of oral hypoglycemic drugs: Secondary | ICD-10-CM

## 2024-01-19 DIAGNOSIS — E1122 Type 2 diabetes mellitus with diabetic chronic kidney disease: Secondary | ICD-10-CM

## 2024-01-19 DIAGNOSIS — J449 Chronic obstructive pulmonary disease, unspecified: Secondary | ICD-10-CM

## 2024-01-19 DIAGNOSIS — N1831 Chronic kidney disease, stage 3a: Secondary | ICD-10-CM

## 2024-01-19 DIAGNOSIS — I4891 Unspecified atrial fibrillation: Secondary | ICD-10-CM

## 2024-01-19 DIAGNOSIS — I5020 Unspecified systolic (congestive) heart failure: Secondary | ICD-10-CM

## 2024-01-19 NOTE — Assessment & Plan Note (Signed)
 BP is low today but denies symptoms of increased dizziness or fatigue.

## 2024-01-19 NOTE — Assessment & Plan Note (Signed)
 Lab Results  Component Value Date   HGBA1C 5.9 12/18/2023   Blood sugars have been well-controlled.  Remains on jardiance for coexisting CKD/CHF as well.

## 2024-01-19 NOTE — Progress Notes (Signed)
 Ryan Bernard - 89 y.o. male MRN [id number]  Date of birth: 07-04-1931  Subjective Chief Complaint  Patient presents with   Congestive Heart Failure   Atrial Fibrillation    HPI Ryan Bernard is a 89 y.o. male here today for follow up.  Previously hospitalized for CHF exacerbation.  Remains on  entresto, aldactone, metoprolol  and jardiance.  Diuresis with furosemide  at 20mg  daily.   His weight is stable.  He does weight daily and takes additional furosemide  for weight gain >5 lbs in 24 hour period or new symptoms. He has not noticed increased edema.  Denies chest pain.   He also has COPD and remains on O2 at 4L.  Doing ok with current inhaler/neb treatments.  Denies increase in dyspnea.   ROS:  A comprehensive ROS was completed and negative except as noted per HPI  No Known Allergies  Past Medical History:  Diagnosis Date   Anemia    Arthritis    BPH (benign prostatic hyperplasia)    CAD (coronary artery disease) 03/11/1993   w/ angioplasty   CHF (congestive heart failure) (HCC)    Chronic kidney disease    Chronic obstructive pulmonary disease (HCC) 12/17/2021   Chronic obstructive pulmonary disease (HCC) 12/17/2021   Diabetes mellitus 03/11/2002   type 2   Heart disease    Mild valvular w/ pulm HTN   Heart murmur    Hyperlipidemia    Hypertension    SBE (subacute bacterial endocarditis) prophylaxis candidate    Well adult exam 12/17/2021    Past Surgical History:  Procedure Laterality Date   ANGIOPLASTY  03/11/1993   [facility]   BACK SURGERY  03/11/1962   CORONARY ARTERY BYPASS GRAFT  10/27/2009   5 vessel, Dr. Marty Search   EYE SURGERY     HERNIA REPAIR     x 2     Social History   Socioeconomic History   Marital status: Widowed    Spouse name: Not on file   Number of children: 2   Years of education: 8   Highest education level: 12th grade  Occupational History   Occupation: retired    Comment: insurance claims handler  Tobacco Use   Smoking status: Never    Smokeless tobacco: Never  Vaping Use   Vaping status: Never Used  Substance and Sexual Activity   Alcohol use: No   Drug use: No   Sexual activity: Not Currently  Other Topics Concern   Not on file  Social History Narrative   Lives with his son. He has two children. Sits around house and watches TV and walks in the yard.   Social Drivers of Corporate Investment Banker Strain: Low Risk  (09/09/2023)   Received from [facility]   Overall Financial Resource Strain (CARDIA)    Difficulty of Paying Living Expenses: Not hard at all  Food Insecurity: No Food Insecurity (12/30/2023)   Hunger Vital Sign    Worried About Running Out of Food in the Last Year: Never true    Ran Out of Food in the Last Year: Never true  Transportation Needs: No Transportation Needs (12/30/2023)   PRAPARE - Administrator, Civil Service (Medical): No    Lack of Transportation (Non-Medical): No  Physical Activity: Sufficiently Active (05/20/2023)   Exercise Vital Sign    Days of Exercise per Week: 6 days    Minutes of Exercise per Session: 40 min  Stress: No Stress Concern Present (12/26/2023)   Received from  [facility]   [name] of Occupational Health - Occupational Stress Questionnaire    Do you feel stress - tense, restless, nervous, or anxious, or unable to sleep at night because your mind is troubled all the time - these days?: Only a little  Social Connections: Moderately Integrated (09/26/2023)   Social Connection and Isolation Panel    Frequency of Communication with Friends and Family: More than three times a week    Frequency of Social Gatherings with Friends and Family: More than three times a week    Attends Religious Services: More than 4 times per year    Active Member of [employer] or Organizations: Yes    Attends Banker Meetings: More than 4 times per year    Marital Status: Widowed    Family History  Problem Relation Age of Onset   Heart disease Mother     Hypertension Mother    Alcohol abuse Father    Depression Daughter    Depression Son     Health Maintenance  Topic Date Due   COVID-19 Vaccine (8 - 2025-26 season) 01/20/2025 (Originally 11/10/2023)   Medicare Annual Wellness (AWV)  05/19/2024   HEMOGLOBIN A1C  06/17/2024   OPHTHALMOLOGY EXAM  08/20/2024   FOOT EXAM  01/04/2025   DTaP/Tdap/Td (4 - Td or Tdap) 10/09/2025   Pneumococcal Vaccine: 50+ Years  Completed   Influenza Vaccine  Completed   Zoster Vaccines- Shingrix  Completed   Meningococcal B Vaccine  Aged Out     ----------------------------------------------------------------------------------------------------------------------------------------------------------------------------------------------------------------- Physical Exam BP 113/62   Pulse 100   Ht 5' 7.5 (1.715 m)   Wt 132 lb (59.9 kg)   SpO2 92%   BMI 20.37 kg/m   Physical Exam Constitutional:      Appearance: Normal appearance.  Eyes:     General: No scleral icterus. Cardiovascular:     Rate and Rhythm: Rhythm irregular.     Heart sounds: Murmur heard.  Pulmonary:     Effort: Pulmonary effort is normal.     Breath sounds: Normal breath sounds.  Neurological:     Mental Status: He is alert.  Psychiatric:        Mood and Affect: Mood normal.        Behavior: Behavior normal.     ------------------------------------------------------------------------------------------------------------------------------------------------------------------------------------------------------------------- Assessment and Plan  Essential hypertension, benign BP is low today but denies symptoms of increased dizziness or fatigue.   Atrial fibrillation (HCC) Chronic in nature.  Anticoagulated with Eliquis .  Rate control with metoprolol .  Overall stable at this time.  ACC/AHA stage C heart failure with reduced ejection fraction (HCC) Appears euvolemic on exam.  Continue current medications.  He will plan to  follow up with cardiology as planned in a month or two.   Type 2 diabetes mellitus with diabetic chronic kidney disease (HCC) Lab Results  Component Value Date   HGBA1C 5.9 12/18/2023   Blood sugars have been well-controlled.  Remains on jardiance for coexisting CKD/CHF as well.   No orders of the defined types were placed in this encounter.   No follow-ups on file.

## 2024-01-19 NOTE — Assessment & Plan Note (Signed)
 Chronic in nature.  Anticoagulated with Eliquis.  Rate control with metoprolol.  Overall stable at this time.

## 2024-01-19 NOTE — Assessment & Plan Note (Signed)
 Appears euvolemic on exam.  Continue current medications.  He will plan to follow up with cardiology as planned in a month or two.

## 2024-01-20 LAB — BASIC METABOLIC PANEL WITH GFR
BUN/Creatinine Ratio: 23 (ref 10–24)
BUN: 21 mg/dL (ref 10–36)
CO2: 31 mmol/L — ABNORMAL HIGH (ref 20–29)
Calcium: 9 mg/dL (ref 8.6–10.2)
Chloride: 96 mmol/L (ref 96–106)
Creatinine, Ser: 0.92 mg/dL (ref 0.76–1.27)
Glucose: 233 mg/dL — ABNORMAL HIGH (ref 70–99)
Potassium: 3.7 mmol/L (ref 3.5–5.2)
Sodium: 140 mmol/L (ref 134–144)
eGFR: 79 mL/min/1.73 (ref >59)

## 2024-01-21 ENCOUNTER — Ambulatory Visit: Payer: Self-pay | Admitting: Family Medicine

## 2024-01-22 DIAGNOSIS — S022XXD Fracture of nasal bones, subsequent encounter for fracture with routine healing: Secondary | ICD-10-CM | POA: Diagnosis not present

## 2024-01-22 DIAGNOSIS — N179 Acute kidney failure, unspecified: Secondary | ICD-10-CM | POA: Diagnosis not present

## 2024-01-22 DIAGNOSIS — I110 Hypertensive heart disease with heart failure: Secondary | ICD-10-CM | POA: Diagnosis not present

## 2024-01-22 DIAGNOSIS — D696 Thrombocytopenia, unspecified: Secondary | ICD-10-CM | POA: Diagnosis not present

## 2024-01-22 DIAGNOSIS — I2510 Atherosclerotic heart disease of native coronary artery without angina pectoris: Secondary | ICD-10-CM | POA: Diagnosis not present

## 2024-01-22 DIAGNOSIS — E119 Type 2 diabetes mellitus without complications: Secondary | ICD-10-CM | POA: Diagnosis not present

## 2024-01-22 DIAGNOSIS — J9621 Acute and chronic respiratory failure with hypoxia: Secondary | ICD-10-CM | POA: Diagnosis not present

## 2024-01-22 DIAGNOSIS — I5022 Chronic systolic (congestive) heart failure: Secondary | ICD-10-CM | POA: Diagnosis not present

## 2024-01-22 DIAGNOSIS — I482 Chronic atrial fibrillation, unspecified: Secondary | ICD-10-CM | POA: Diagnosis not present

## 2024-01-25 DIAGNOSIS — R4189 Other symptoms and signs involving cognitive functions and awareness: Secondary | ICD-10-CM | POA: Diagnosis not present

## 2024-01-25 DIAGNOSIS — G459 Transient cerebral ischemic attack, unspecified: Secondary | ICD-10-CM | POA: Diagnosis not present

## 2024-01-25 DIAGNOSIS — D539 Nutritional anemia, unspecified: Secondary | ICD-10-CM | POA: Diagnosis not present

## 2024-01-25 DIAGNOSIS — R918 Other nonspecific abnormal finding of lung field: Secondary | ICD-10-CM | POA: Diagnosis not present

## 2024-01-25 DIAGNOSIS — R000 Tachycardia, unspecified: Secondary | ICD-10-CM | POA: Diagnosis not present

## 2024-01-25 DIAGNOSIS — J90 Pleural effusion, not elsewhere classified: Secondary | ICD-10-CM | POA: Diagnosis not present

## 2024-01-25 DIAGNOSIS — I447 Left bundle-branch block, unspecified: Secondary | ICD-10-CM | POA: Diagnosis not present

## 2024-01-25 DIAGNOSIS — R0602 Shortness of breath: Secondary | ICD-10-CM | POA: Diagnosis not present

## 2024-01-25 DIAGNOSIS — I509 Heart failure, unspecified: Secondary | ICD-10-CM | POA: Diagnosis not present

## 2024-01-25 DIAGNOSIS — R7303 Prediabetes: Secondary | ICD-10-CM | POA: Diagnosis not present

## 2024-01-25 DIAGNOSIS — R531 Weakness: Secondary | ICD-10-CM | POA: Diagnosis not present

## 2024-01-25 DIAGNOSIS — I4891 Unspecified atrial fibrillation: Secondary | ICD-10-CM | POA: Diagnosis not present

## 2024-01-25 DIAGNOSIS — I5023 Acute on chronic systolic (congestive) heart failure: Secondary | ICD-10-CM | POA: Diagnosis not present

## 2024-01-25 DIAGNOSIS — J449 Chronic obstructive pulmonary disease, unspecified: Secondary | ICD-10-CM | POA: Diagnosis not present

## 2024-01-25 DIAGNOSIS — J439 Emphysema, unspecified: Secondary | ICD-10-CM | POA: Diagnosis not present

## 2024-01-25 DIAGNOSIS — I482 Chronic atrial fibrillation, unspecified: Secondary | ICD-10-CM | POA: Diagnosis not present

## 2024-01-25 DIAGNOSIS — I2510 Atherosclerotic heart disease of native coronary artery without angina pectoris: Secondary | ICD-10-CM | POA: Diagnosis not present

## 2024-01-25 DIAGNOSIS — J9611 Chronic respiratory failure with hypoxia: Secondary | ICD-10-CM | POA: Diagnosis not present

## 2024-01-25 DIAGNOSIS — Z8709 Personal history of other diseases of the respiratory system: Secondary | ICD-10-CM | POA: Diagnosis not present

## 2024-01-26 ENCOUNTER — Telehealth: Payer: Self-pay

## 2024-01-26 DIAGNOSIS — R2990 Unspecified symptoms and signs involving the nervous system: Secondary | ICD-10-CM | POA: Diagnosis not present

## 2024-01-26 DIAGNOSIS — G319 Degenerative disease of nervous system, unspecified: Secondary | ICD-10-CM | POA: Diagnosis not present

## 2024-01-26 DIAGNOSIS — I3139 Other pericardial effusion (noninflammatory): Secondary | ICD-10-CM | POA: Diagnosis not present

## 2024-01-26 DIAGNOSIS — I63411 Cerebral infarction due to embolism of right middle cerebral artery: Secondary | ICD-10-CM | POA: Diagnosis not present

## 2024-01-26 DIAGNOSIS — I088 Other rheumatic multiple valve diseases: Secondary | ICD-10-CM | POA: Diagnosis not present

## 2024-01-26 DIAGNOSIS — J90 Pleural effusion, not elsewhere classified: Secondary | ICD-10-CM | POA: Diagnosis not present

## 2024-01-26 NOTE — Telephone Encounter (Unsigned)
 Copied from CRM #[id number]. Topic: Clinical - Home Health Verbal Orders >> Jan 26, 2024  9:25 AM Rosaria BRAVO wrote: Rollene from [facility] called to report that they have re certified the patient for another few weeks of home care. Nursing.   Best contact: [phone]

## 2024-01-27 DIAGNOSIS — Z48813 Encounter for surgical aftercare following surgery on the respiratory system: Secondary | ICD-10-CM | POA: Diagnosis not present

## 2024-01-28 DIAGNOSIS — R2990 Unspecified symptoms and signs involving the nervous system: Secondary | ICD-10-CM | POA: Diagnosis not present

## 2024-01-28 DIAGNOSIS — Z48813 Encounter for surgical aftercare following surgery on the respiratory system: Secondary | ICD-10-CM | POA: Diagnosis not present

## 2024-01-29 ENCOUNTER — Telehealth: Payer: Self-pay

## 2024-01-30 DIAGNOSIS — I482 Chronic atrial fibrillation, unspecified: Secondary | ICD-10-CM | POA: Diagnosis not present

## 2024-01-30 DIAGNOSIS — Z7409 Other reduced mobility: Secondary | ICD-10-CM | POA: Diagnosis not present

## 2024-02-02 ENCOUNTER — Telehealth: Payer: Self-pay

## 2024-02-02 DIAGNOSIS — I5022 Chronic systolic (congestive) heart failure: Secondary | ICD-10-CM | POA: Diagnosis not present

## 2024-02-02 DIAGNOSIS — I509 Heart failure, unspecified: Secondary | ICD-10-CM | POA: Diagnosis not present

## 2024-02-02 DIAGNOSIS — R918 Other nonspecific abnormal finding of lung field: Secondary | ICD-10-CM | POA: Diagnosis not present

## 2024-02-02 DIAGNOSIS — I350 Nonrheumatic aortic (valve) stenosis: Secondary | ICD-10-CM | POA: Diagnosis not present

## 2024-02-02 DIAGNOSIS — I482 Chronic atrial fibrillation, unspecified: Secondary | ICD-10-CM | POA: Diagnosis not present

## 2024-02-02 NOTE — Telephone Encounter (Signed)
 Copied from CRM [id number]. Topic: Clinical - Home Health Verbal Orders >> Jan 30, 2024  3:40 PM Kevelyn M wrote: Caller/Agency: Lorraina/[facility] Callback Number: [phone] Service Requested: Skilled Nursing Frequency: 1 week 9 until PRN Any new concerns about the patient? Yes, Patient had 3 falls yesterday with no lose of consciousness. One he slid off of the bed, and then the other 2 times he was dizzy. 92/60 in left arm

## 2024-02-03 NOTE — Telephone Encounter (Signed)
 Copied from CRM #[id number]. Topic: General - Other >> Feb 03, 2024 11:10 AM Treva T wrote: Reason for CRM: Lorena calling with [facility], calling to follow up on verbal orders requested on 01/30/24.  Can be reached back at [phone], to discus further.   Caller aware of same day call back.

## 2024-02-03 NOTE — Telephone Encounter (Signed)
 Okay to give verbal order. I would also call pt and have him schedule a follow up given his hypotension

## 2024-02-09 NOTE — Telephone Encounter (Signed)
 VO given. Pt scheduled for next month

## 2024-02-13 ENCOUNTER — Ambulatory Visit: Payer: Self-pay

## 2024-02-13 NOTE — Telephone Encounter (Signed)
 FYI Only or Action Required?: FYI only for provider: Patient had episode of vertigo when walking, got himself to ground safely. No injuries.  Patient was last seen in primary care on 01/19/2024 by [name], DO.  Called Nurse Triage reporting Fall.  Symptoms began several months ago.  Interventions attempted: Nothing.  Symptoms are: stable.  Triage Disposition: Home Care  Patient/caregiver understands and will follow disposition?: Yes  Copied from CRM #[id number]. Topic: Clinical - Red Word Triage >> Feb 13, 2024  2:41 PM Victoria B wrote: [facility] that prompted transfer to Nurse Triage: home healh nurse wants to report, patient didnt fall, and not dizzy but slid into the floor while holding on to the wall to keep from falling, do I still get this to NT? Reason for Disposition  [1] Recent fall AND [2] no injury  Answer Assessment - Initial Assessment Questions Patient was walking to bathroom and had vertigo, which he has had before. Nurse not present at time of fall. Home health nurse Olam states he guided himself down to the floor with assistance of the wall. He did not fall hard or hit any body part.  Is not complaining of pain. Has no wounds. He is not dizzy currently.  Not present concerns, home health nurse just wanted to inform. 106/66 72  1. DESCRIPTION: Describe your dizziness.     One bout of dizziness on way to bathroom. 4. SEVERITY: How bad is it?  Can you walk?     Patient can walk 5. ONSET:  When did the dizziness begin?     Sudden onset walking to bathroom 7. CAUSE: What do you think is causing the dizziness?     Has some times.  Protocols used: Falls and [facility]

## 2024-02-27 ENCOUNTER — Ambulatory Visit: Payer: Self-pay

## 2024-02-27 NOTE — Telephone Encounter (Signed)
 Tried calling the patient's family for a status update. No answer. Left a brief detailed voicemail msg requesting a call back to the clinic. Direct call back information provided.

## 2024-02-27 NOTE — Telephone Encounter (Signed)
 FYI Only or Action Required?: FYI only for provider: ED advised.  Patient was last seen in primary care on 01/19/2024 by [name], DO.  Called Nurse Triage reporting Shortness of Breath.  Symptoms began a week ago.  Interventions attempted: Rest, hydration, or home remedies and Other: 4L [place] home oxygen .  Symptoms are: gradually worsening.  Triage Disposition: Go to ED Now (Notify PCP)  Patient/caregiver understands and will follow disposition?: Yes  Reason for Disposition  Oxygen  level (e.g., pulse oximetry) 90% or lower  Answer Assessment - Initial Assessment Questions Spoke with [facility] nurse, Stefani Metro, with patient and patient's family member present. Valma reports O2 saturations of 82-85% on 4 liters nasal cannula during this visit with course lung sounds. Vital signs listed below. Patient's family reports that patient has not been tolerating activity over the last week with increased fatigue and SOB. No SOB present during triage. ED advised.   1. MAIN CONCERN OR SYMPTOM: What's your main concern? (e.g., low oxygen  level, breathing difficulty) What question do you have?     Low oxygen  level, increased SOB on exertion  2. ONSET: When did the  increased SOB  start?      1 week ago  3. OXYGEN  THERAPY:      4L [place]  4.  OXYGEN  EQUIPMENT:  Are you having any trouble with your oxygen  equipment?  (e.g., cannula, mask, tubing, tank, concentrator)     No trouble with equipment, but does mention being congested in one nostril  5. OXYGEN  SATURATION MONITOR:       Home health equipment  6. OXYGEN  LEVEL: What is your reading (oxygen  level) today? What is your usual oxygen  saturation reading? (e.g., 95%)     85%  7. VSS MONITORING: Do you monitor/measure your oxygen  level or vital signs? (e.g., yes, no, measurements are automatically sent to provider/call center). Document CURRENT and NORMAL BASELINE values if available.       Current vitals from  [facility] nurse: 99.3F 88HR, 18RR, 108/80, 85% 4L [place]; weight 131lbs, FSBS 143  8. BREATHING DIFFICULTY: Are you having any difficulty breathing? If Yes, ask: How bad is it?  (e.g., none, mild, moderate, severe)      No current SOB during triage, but does report increased fatigue and SOB over the last week with activity  9. OTHER SYMPTOMS: Do you have any other symptoms? (e.g., fever, change in sputum)     Mild fever, HH nurse reports coarse lung sounds in all lobes   10. SMOKING: Do you smoke currently? Is there anyone that smokes around you?  (Note: smoking around oxygen  is dangerous!)       No  Protocols used: Oxygen  Monitoring and Hypoxia-A-AH  Copied from CRM #[id number]. Topic: Clinical - Home Health Verbal Orders >> Feb 27, 2024 12:57 PM Delon DASEN wrote: Caller/Agency: Olam with [facility] Callback Number: n/a Service Requested: Skilled Nursing Frequency: n/a  Any new concerns about the patient? Yes sounds more congested today - oxygen  level 82-85, he is on 4 liters of oxygen , son noticed he is more tired and taking longer to recover   nurse Stefani [phone]

## 2024-03-09 ENCOUNTER — Ambulatory Visit: Payer: Self-pay

## 2024-03-09 ENCOUNTER — Telehealth: Payer: Self-pay

## 2024-03-09 NOTE — Telephone Encounter (Signed)
 Copied from CRM [id number]. Topic: Clinical - Home Health Verbal Orders >> Mar 08, 2024  4:39 PM Tobias CROME wrote: Caller/Agency: [facility]  Callback Number: [phone] Service Requested: Skilled Nursing Frequency: 1x4 2 PRN Any new concerns about the patient? No

## 2024-03-09 NOTE — Telephone Encounter (Signed)
 FYI Only or Action Required?: Action required by provider: decline ER requesting follow up appointment . Will call [facility]  Patient was last seen in primary care on 01/19/2024 by [name], DO.  Called Nurse Triage reporting Fatigue.  Symptoms began several days ago.  Interventions attempted: Other: monitoring him close .  Symptoms are: gradually worsening.  Triage Disposition: Go to ED Now (Notify PCP)  Patient/caregiver understands and will follow disposition?: No, refuses disposition              Reason for Disposition  Difficulty breathing  Answer Assessment - Initial Assessment Questions Patient son (no consent on file)  He was in hospital last week CHF, discharged Friday. Was told to follow up with primary within the week. Has an appointment 03/26/2023.   Reviewed patients chart Today saw cardiologist not able to walk to exam room from waiting without out of breath or nearly passing out.  The shortness of breath is with exertion not at rest. Has pretty good appetite. No mention of chest pain or increased swelling.  Sent from cardiologist today for chest imaging waiting for results. On oxygen  at all time, earlier today oxygen  tube came out of nose today heard him gagging in bathroom placed the nasal canula back in nose took 4-5 minutes to recover from. Did advise ER would be recommended patient son requesting appointment with PCP   Patient came on the phone provided permission to speak with son. Currently feeling better than was earlier. Current oxygen  95%, 4 liter oxygen . Walks with walker most the time at this time is questionable if he could walk without feeling or passing out due to weakness and breathing problems.  Patients son prefers to monitor him for the next few hours and if worsening will take to hospital. Patients son goal is to keep patient comfortable and states understands his body is shutting down  advised patients son will update PCP regarding all above .  Patient son also again reassures if he worsening at all will take him to ER     2. SEVERITY: How bad is it?  Can you stand and walk?     Severe weakness not able to stand and walk  3. ONSET: When did these symptoms begin? (e.g., hours, days, weeks, months)     Worse since hospital discharge  Protocols used: Weakness (Generalized) and Fatigue-A-AH    Copied from CRM #[id number]. Topic: Clinical - Red Word Triage >> Mar 09, 2024 11:47 AM Victoria B wrote: Patient feeling very weak

## 2024-03-09 NOTE — Telephone Encounter (Signed)
 [facility] nurse informed of verbal orders approval

## 2024-03-09 NOTE — Telephone Encounter (Signed)
 Call two attemps - not able to reach CAL

## 2024-03-10 ENCOUNTER — Ambulatory Visit: Admitting: Family Medicine

## 2024-03-10 ENCOUNTER — Encounter: Payer: Self-pay | Admitting: Family Medicine

## 2024-03-10 VITALS — BP 136/76 | HR 79 | Ht 67.5 in | Wt 130.0 lb

## 2024-03-10 DIAGNOSIS — N1831 Chronic kidney disease, stage 3a: Secondary | ICD-10-CM

## 2024-03-10 DIAGNOSIS — I482 Chronic atrial fibrillation, unspecified: Secondary | ICD-10-CM | POA: Diagnosis not present

## 2024-03-10 DIAGNOSIS — I5043 Acute on chronic combined systolic (congestive) and diastolic (congestive) heart failure: Secondary | ICD-10-CM | POA: Diagnosis not present

## 2024-03-10 DIAGNOSIS — J9611 Chronic respiratory failure with hypoxia: Secondary | ICD-10-CM | POA: Diagnosis not present

## 2024-03-10 NOTE — Progress Notes (Signed)
 " Ryan Bernard - 89 y.o. male MRN [id number]  Date of birth: [birth year]  Subjective Chief Complaint  Patient presents with   Hospitalization Follow-up    HPI Ryan Bernard is a 89 y.o. male here today for follow up of recent hospitalization.  He was recently hospitalized due to CHF exacerbation.  Diuresed in the hospital.  Sim held due to AKI.  Digoxin considered but was not started this time.  Maintained on 4 L O2 during hospitalization.  He has followed up with cardiology since discharge.  Updated chest x-ray ordered.  Bilateral consultation was made during his hospitalization and has appointment next week.  Maintained on Toprol -XL as well as Aldactone.  Today he reports that he remains at around his baseline.  A little more swelling in bilateral lower extremities.  Denies increasing dyspnea, cough or chest pain.   ROS:  A comprehensive ROS was completed and negative except as noted per HPI  Allergies[1]  Past Medical History:  Diagnosis Date   Anemia    Arthritis    BPH (benign prostatic hyperplasia)    CAD (coronary artery disease) 03/11/1993   w/ angioplasty   CHF (congestive heart failure) (HCC)    Chronic kidney disease    Chronic obstructive pulmonary disease (HCC) 12/17/2021   Chronic obstructive pulmonary disease (HCC) 12/17/2021   Clotting disorder    Diabetes mellitus 03/11/2002   type 2   Heart disease    Mild valvular w/ pulm HTN   Heart murmur    Hyperlipidemia    Hypertension    SBE (subacute bacterial endocarditis) prophylaxis candidate    Well adult exam 12/17/2021    Past Surgical History:  Procedure Laterality Date   ANGIOPLASTY  03/11/1993   [facility]   BACK SURGERY  03/11/1962   CORONARY ARTERY BYPASS GRAFT  10/27/2009   5 vessel, Dr. Marty Search   EYE SURGERY     HERNIA REPAIR     x 2     Social History   Socioeconomic History   Marital status: Widowed    Spouse name: Not on file   Number of children: 2   Years of education: 31    Highest education level: 12th grade  Occupational History   Occupation: retired    Comment: insurance claims handler  Tobacco Use   Smoking status: Never   Smokeless tobacco: Never  Vaping Use   Vaping status: Never Used  Substance and Sexual Activity   Alcohol use: No   Drug use: No   Sexual activity: Not Currently  Other Topics Concern   Not on file  Social History Narrative   Lives with his son. He has two children. Sits around house and watches TV and walks in the yard.   Social Drivers of Health   Tobacco Use: Low Risk (03/14/2024)   Patient History    Smoking Tobacco Use: Never    Smokeless Tobacco Use: Never    Passive Exposure: Not on file  Financial Resource Strain: Low Risk (09/09/2023)   Received from [facility]   Overall Financial Resource Strain (CARDIA)    Difficulty of Paying Living Expenses: Not hard at all  Food Insecurity: No Food Insecurity (03/12/2024)   Received from [facility]   Epic    Within the past 12 months, you worried that your food would run out before you got the money to buy more.: Never true    Within the past 12 months, the food you bought just didn't last and you didn't  have money to get more.: Never true  Transportation Needs: No Transportation Needs (03/13/2024)   Received from [facility]    In the past 12 months, has lack of transportation kept you from medical appointments or from getting medications?: No    In the past 12 months, has lack of transportation kept you from meetings, work, or from getting things needed for daily living?: No  Physical Activity: Sufficiently Active (05/20/2023)   Exercise Vital Sign    Days of Exercise per Week: 6 days    Minutes of Exercise per Session: 40 min  Stress: No Stress Concern Present (03/12/2024)   Received from [facility] - Occupational Stress Questionnaire    Do you feel stress - tense, restless, nervous, or anxious, or unable to sleep at night because  your mind is troubled all the time - these days?: Only a little  Social Connections: Moderately Integrated (09/26/2023)   Social Connection and Isolation Panel    Frequency of Communication with Friends and Family: More than three times a week    Frequency of Social Gatherings with Friends and Family: More than three times a week    Attends Religious Services: More than 4 times per year    Active Member of [employer] or Organizations: Yes    Attends Banker Meetings: More than 4 times per year    Marital Status: Widowed  Depression (PHQ2-9): Medium Risk (03/10/2024)   Depression (PHQ2-9)    PHQ-2 Score: 7  Alcohol Screen: Low Risk (05/20/2023)   Alcohol Screen    Last Alcohol Screening Score (AUDIT): 0  Housing: Low Risk (03/12/2024)   Received from [facility]    In the last 12 months, was there a time when you were not able to pay the mortgage or rent on time?: No    In the past 12 months, how many times have you moved where you were living?: 0    At any time in the past 12 months, were you homeless or living in a shelter (including now)?: No  Utilities: Not At Risk (03/12/2024)   Received from [facility]    In the past 12 months has the electric, gas, oil, or water company threatened to shut off services in your home?: No  Health Literacy: Adequate Health Literacy (09/26/2023)   B1300 Health Literacy    Frequency of need for help with medical instructions: Never    Family History  Problem Relation Age of Onset   Heart disease Mother    Hypertension Mother    Alcohol abuse Father    Depression Daughter    Depression Son     Health Maintenance  Topic Date Due   COVID-19 Vaccine (8 - 2025-26 season) 01/20/2025 (Originally 11/10/2023)   Medicare Annual Wellness (AWV)  05/19/2024   HEMOGLOBIN A1C  06/17/2024   OPHTHALMOLOGY EXAM  08/20/2024   FOOT EXAM  01/04/2025   DTaP/Tdap/Td (4 - Td or Tdap) 10/09/2025   Pneumococcal Vaccine: 50+ Years  Completed    Influenza Vaccine  Completed   Zoster Vaccines- Shingrix  Completed   Meningococcal B Vaccine  Aged Out     ----------------------------------------------------------------------------------------------------------------------------------------------------------------------------------------------------------------- Physical Exam BP 136/76   Pulse 79   Ht 5' 7.5 (1.715 m)   Wt 130 lb (59 kg) Comment: pt reported from cardiology appt yesterday  SpO2 95% Comment: on 4L O2  BMI 20.06 kg/m   Physical Exam  Constitutional:      Appearance: Normal appearance.  Eyes:     General: No scleral icterus. Cardiovascular:     Rate and Rhythm: Normal rate. Rhythm irregular.     Heart sounds: Murmur heard.  Pulmonary:     Effort: Pulmonary effort is normal.     Breath sounds: Normal breath sounds.  Musculoskeletal:     Right lower leg: Edema present.     Left lower leg: Edema present.  Neurological:     Mental Status: He is alert.  Psychiatric:        Mood and Affect: Mood normal.        Behavior: Behavior normal.     ------------------------------------------------------------------------------------------------------------------------------------------------------------------------------------------------------------------- Assessment and Plan  Chronic kidney disease, stage 3 (HCC) Patient renal function improved from hospitalization.  Limit nephrotoxic agents and avoid hypotension.  Atrial fibrillation (HCC) Chronic in nature.  Anticoagulated with Eliquis .  Rate control with metoprolol .  Overall stable at this time.  Acute on chronic congestive heart failure (HCC) Some increased edema on exam.  Will have him resume furosemide  for now over the next few days and follow-up in 1 to 2 weeks.  Current medications are being managed by cardiology.  Entresto is being held currently due to acute kidney injury during hospitalization.  He will continue with metoprolol , Aldactone and furosemide   as needed.  Chronic respiratory failure (HCC) Remains on oxygen  most of the time.  Recommend continuation.   No orders of the defined types were placed in this encounter.   Return in about 2 weeks (around 03/24/2024) for CHF.        [1] No Known Allergies  "

## 2024-03-10 NOTE — Patient Instructions (Signed)
 Take furosemide  20mg  twice daily x5 days.  See me again in 2 weeks.

## 2024-03-10 NOTE — Telephone Encounter (Signed)
 Patient scheduled for today 12/3/12025 at 2:10pm

## 2024-03-14 ENCOUNTER — Encounter: Payer: Self-pay | Admitting: Family Medicine

## 2024-03-14 NOTE — Assessment & Plan Note (Signed)
 Patient renal function improved from hospitalization.  Limit nephrotoxic agents and avoid hypotension.

## 2024-03-14 NOTE — Assessment & Plan Note (Signed)
 Chronic in nature.  Anticoagulated with Eliquis.  Rate control with metoprolol.  Overall stable at this time.

## 2024-03-14 NOTE — Assessment & Plan Note (Signed)
 Some increased edema on exam.  Will have him resume furosemide  for now over the next few days and follow-up in 1 to 2 weeks.  Current medications are being managed by cardiology.  Entresto is being held currently due to acute kidney injury during hospitalization.  He will continue with metoprolol , Aldactone and furosemide  as needed.

## 2024-03-14 NOTE — Assessment & Plan Note (Signed)
 Remains on oxygen  most of the time.  Recommend continuation.

## 2024-03-24 ENCOUNTER — Ambulatory Visit: Admitting: Family Medicine

## 2024-03-25 ENCOUNTER — Ambulatory Visit: Admitting: Family Medicine

## 2024-04-04 ENCOUNTER — Telehealth: Payer: Self-pay | Admitting: Family Medicine

## 2024-04-04 NOTE — Telephone Encounter (Signed)
 Received message below from [facility] with the  following information:  Chanetta Volanda Alvia Velma, DO; [place], Tosha L Phone Number: [phone]   Good Morning, Bosco transitioned to hospice care with [facility] on 03/21/2024. This decision is aligned with the patient and his family goals of care. Please let me know how [facility] can support your patients with their palliative and hospice needs. Thanks

## 2024-04-06 NOTE — Telephone Encounter (Signed)
 Yes, I marked his chart yesterday, will send card around tomorrow.

## 2024-04-11 DEATH — deceased

## 2024-05-25 ENCOUNTER — Ambulatory Visit

## 2024-06-17 ENCOUNTER — Ambulatory Visit: Payer: Medicare PPO | Admitting: Family Medicine
